# Patient Record
Sex: Male | Born: 1950 | Race: Black or African American | Hispanic: No | Marital: Single | Smoking: Former smoker
Health system: Southern US, Community
[De-identification: ages and names within clinical notes are randomized; demographics above are authoritative.]

## PROBLEM LIST (undated history)

## (undated) DIAGNOSIS — F419 Anxiety disorder, unspecified: Secondary | ICD-10-CM

## (undated) DIAGNOSIS — E785 Hyperlipidemia, unspecified: Secondary | ICD-10-CM

## (undated) DIAGNOSIS — F329 Major depressive disorder, single episode, unspecified: Secondary | ICD-10-CM

## (undated) DIAGNOSIS — E559 Vitamin D deficiency, unspecified: Secondary | ICD-10-CM

## (undated) DIAGNOSIS — F32A Depression, unspecified: Secondary | ICD-10-CM

## (undated) DIAGNOSIS — R9430 Abnormal result of cardiovascular function study, unspecified: Secondary | ICD-10-CM

## (undated) DIAGNOSIS — I2510 Atherosclerotic heart disease of native coronary artery without angina pectoris: Secondary | ICD-10-CM

## (undated) DIAGNOSIS — I499 Cardiac arrhythmia, unspecified: Secondary | ICD-10-CM

## (undated) DIAGNOSIS — R0602 Shortness of breath: Secondary | ICD-10-CM

## (undated) DIAGNOSIS — I10 Essential (primary) hypertension: Secondary | ICD-10-CM

## (undated) DIAGNOSIS — K219 Gastro-esophageal reflux disease without esophagitis: Secondary | ICD-10-CM

## (undated) DIAGNOSIS — C61 Malignant neoplasm of prostate: Secondary | ICD-10-CM

## (undated) HISTORY — DX: Abnormal result of cardiovascular function study, unspecified: R94.30

## (undated) HISTORY — DX: Atherosclerotic heart disease of native coronary artery without angina pectoris: I25.10

## (undated) HISTORY — DX: Hyperlipidemia, unspecified: E78.5

## (undated) HISTORY — DX: Anxiety disorder, unspecified: F41.9

## (undated) HISTORY — PX: CARDIAC CATHETERIZATION: SHX172

## (undated) HISTORY — PX: CORONARY ARTERY BYPASS GRAFT: SHX141

## (undated) HISTORY — DX: Depression, unspecified: F32.A

## (undated) HISTORY — DX: Shortness of breath: R06.02

## (undated) HISTORY — PX: INGUINAL HERNIA REPAIR: SUR1180

## (undated) HISTORY — PX: COLONOSCOPY WITH PROPOFOL: SHX5780

## (undated) HISTORY — DX: Major depressive disorder, single episode, unspecified: F32.9

## (undated) HISTORY — DX: Essential (primary) hypertension: I10

---

## 2000-05-10 ENCOUNTER — Emergency Department (HOSPITAL_COMMUNITY): Admission: EM | Admit: 2000-05-10 | Discharge: 2000-05-10 | Payer: Self-pay | Admitting: Emergency Medicine

## 2001-09-24 DIAGNOSIS — I2510 Atherosclerotic heart disease of native coronary artery without angina pectoris: Secondary | ICD-10-CM

## 2001-09-24 HISTORY — PX: CORONARY ARTERY BYPASS GRAFT: SHX141

## 2001-09-24 HISTORY — DX: Atherosclerotic heart disease of native coronary artery without angina pectoris: I25.10

## 2002-08-07 ENCOUNTER — Emergency Department (HOSPITAL_COMMUNITY): Admission: EM | Admit: 2002-08-07 | Discharge: 2002-08-07 | Payer: Self-pay | Admitting: Emergency Medicine

## 2002-08-11 ENCOUNTER — Emergency Department (HOSPITAL_COMMUNITY): Admission: EM | Admit: 2002-08-11 | Discharge: 2002-08-11 | Payer: Self-pay | Admitting: Emergency Medicine

## 2004-05-18 ENCOUNTER — Inpatient Hospital Stay (HOSPITAL_COMMUNITY): Admission: EM | Admit: 2004-05-18 | Discharge: 2004-05-22 | Payer: Self-pay | Admitting: Emergency Medicine

## 2004-08-03 ENCOUNTER — Ambulatory Visit: Payer: Self-pay | Admitting: Cardiology

## 2004-10-26 ENCOUNTER — Ambulatory Visit: Payer: Self-pay | Admitting: Professional

## 2004-11-08 ENCOUNTER — Ambulatory Visit: Payer: Self-pay | Admitting: Psychology

## 2004-12-05 ENCOUNTER — Ambulatory Visit: Payer: Self-pay | Admitting: Psychology

## 2004-12-15 ENCOUNTER — Ambulatory Visit: Payer: Self-pay | Admitting: Psychology

## 2004-12-25 ENCOUNTER — Ambulatory Visit: Payer: Self-pay | Admitting: Psychology

## 2005-03-13 ENCOUNTER — Ambulatory Visit: Payer: Self-pay | Admitting: Pulmonary Disease

## 2005-04-25 ENCOUNTER — Ambulatory Visit: Payer: Self-pay | Admitting: Pulmonary Disease

## 2005-06-21 ENCOUNTER — Ambulatory Visit: Payer: Self-pay | Admitting: Pulmonary Disease

## 2005-09-10 ENCOUNTER — Ambulatory Visit: Payer: Self-pay | Admitting: Gastroenterology

## 2006-06-04 ENCOUNTER — Ambulatory Visit: Payer: Self-pay | Admitting: Pulmonary Disease

## 2006-06-20 ENCOUNTER — Ambulatory Visit: Payer: Self-pay | Admitting: Cardiology

## 2006-07-02 ENCOUNTER — Ambulatory Visit: Payer: Self-pay

## 2006-09-06 ENCOUNTER — Ambulatory Visit: Payer: Self-pay | Admitting: Pulmonary Disease

## 2006-12-23 ENCOUNTER — Ambulatory Visit: Payer: Self-pay | Admitting: Internal Medicine

## 2008-03-08 DIAGNOSIS — D126 Benign neoplasm of colon, unspecified: Secondary | ICD-10-CM | POA: Insufficient documentation

## 2008-03-08 DIAGNOSIS — E7800 Pure hypercholesterolemia, unspecified: Secondary | ICD-10-CM

## 2008-03-09 ENCOUNTER — Ambulatory Visit: Payer: Self-pay | Admitting: Internal Medicine

## 2008-03-09 DIAGNOSIS — K4090 Unilateral inguinal hernia, without obstruction or gangrene, not specified as recurrent: Secondary | ICD-10-CM | POA: Insufficient documentation

## 2008-03-10 ENCOUNTER — Encounter: Payer: Self-pay | Admitting: Pulmonary Disease

## 2008-03-29 ENCOUNTER — Ambulatory Visit (HOSPITAL_COMMUNITY): Admission: RE | Admit: 2008-03-29 | Discharge: 2008-03-29 | Payer: Self-pay | Admitting: General Surgery

## 2011-02-06 NOTE — Op Note (Signed)
NAMEHAMMAD, [name] Allen NO.:  [id number]   MEDICAL RECORD NO.:  [id number]          PATIENT TYPE:  AMB   LOCATION:  DAY                          FACILITY:  [facility]   PHYSICIAN:  Adolph Pollack, M.D.DATE OF BIRTH:  [birth year]   DATE OF PROCEDURE:  03/29/2008  DATE OF DISCHARGE:                               OPERATIVE REPORT   PREOPERATIVE DIAGNOSIS:  Right inguinal hernia.   POSTOPERATIVE DIAGNOSIS:  Direct right inguinal hernia.   PROCEDURE:  Right inguinal repair with mesh.   SURGEON:  Adolph Pollack, M.D.   ANESTHESIA:  General by way of LMA plus Marcaine local.   INDICATIONS:  Mr. Molstad is a 60 year old male who has had a right  inguinal hernia for number of years, but it has become symptomatic and  getting larger; and now he presents for repair.  The procedure, risks,  and aftercare were discussed with him preoperatively.   TECHNIQUE:  He was seen in the holding area and the right groin marked  with my initials.  He was brought to the operating room, placed supine  on the operating table, and was given a general anesthetic by way of  LMA.  The hair on the right groin area was clipped, and the area  sterilely prepped and draped.  Marcaine solution was infiltrated  superficially and deep in the right groin.  The right groin incision was  made through skin, subcutaneous tissue, and Scarpa fascia until the  external oblique of the aponeurosis was exposed.  Local anesthetic was  then infiltrated deep to the external oblique aponeurosis.  An incision  was made in the external oblique aponeurosis through the external ring  medially, and up toward the anterior superior iliac spine laterally.  The underlying ilioinguinal nerve was identified and local anesthetic  infiltrated around it.   Using blunt dissection the internal oblique aponeurosis and muscle were  exposed superiorly and shelving edge of the inguinal ligament exposed  inferiorly.  I used blunt  dissection to isolate the spermatic cord, and  noted a direct hernia sac adherent to the spermatic cord.  Using blunt  dissection we were able to dissect the direct hernia sac free from the  spermatic cord and reduce it back through the direct defect.  No  indirect sac was noted.   Following this, the spermatic cord was retracted anteriorly and  superiorly.  A piece of the 3 x 6 inch polypropylene mesh was brought  into the field and anchored 1-2 cm medial to the pubic tubercle with 2-0  Prolene suture.  The inferior aspect of mesh was then anchored to the  shelving edge of the inguinal ligament with a running 2-0 Prolene suture  up to the level 1-2 cm lateral to the internal ring.  A slit was cut in  the mesh and 2 tails wrapped around the cord.  The superior aspect of  mesh was then anchored to the internal oblique aponeurosis with  interrupted 2-0 Vicryl sutures.  The 2 tails of mesh were crossed  creating a new internal ring that allowed the  tip of the hemostat to be  introduced.  The tails of mesh were then anchored to the shelving edge  of the inguinal ligament with a single 2-0 Prolene suture.  The lateral  aspect of mesh was then tucked deep to the external oblique aponeurosis.   I then inspected the wound, and hemostasis was adequate.  The external  oblique aponeurosis was closed over the mesh and cord with a running 3-0  Vicryl suture.  Scarpa fascia was closed with a running 2-0 Vicryl  suture.  The skin was closed with 4-0 Monocryl subcuticular stitch  followed by Steri-Strips, and sterile dressings.   He tolerated the procedure well without apparent complications.  Right  testicle was in its normal position in the scrotum.  He was taken to  recovery in satisfactory condition.  He will be given a preprinted with  discharge instructions and Tylox for pain, and follow up in the office  in 2-3 weeks.      Adolph Pollack, M.D.  Electronically Signed     TJR/MEDQ   D:  03/29/2008  T:  03/29/2008  Job:  [id number]   cc:   Lonzo Cloud. Kriste Basque, MD  [address]  [place]  [place] [address]

## 2011-02-09 NOTE — Assessment & Plan Note (Signed)
[facility]                            CARDIOLOGY OFFICE NOTE   NAME:[name] Allen, [name] Allen                         MRN:          [id number]  DATE:12/23/2006                            DOB:          1951/08/25    PRIMARY CARE PHYSICIAN:  Lonzo Cloud. Kriste Basque, M.D.   PRIMARY CARDIOLOGIST:  Luis Abed, MD, [facility]   HISTORY OF PRESENT ILLNESS:  Mr. Keim is a very pleasant 60 year old  male patient with a history of coronary artery disease status post CABG  in 2005 with previous EF of 45-50%.  He last saw Dr. Myrtis Ser in September  of 2007.  He had atypical chest pain at that time.  He was set up for a  stress nuclear study.  He exercised for six minutes.  There were no EKG  changes.  He had no sign of scar or ischemia, and his EF was 54%.  He  returns today for followup of chest pain.  He continues to have atypical  left-sided chest pain.  He describes it as a pinprick sensation.  He  notes that he gets this when he tries to lift something and carry it a  certain distance.  He actually tells me he has been noticing this since  his bypass surgery.  There has really been no change over the last  several months.  He denies any significant change in his shortness of  breath or his chest pain.  There is no radiation.  There is no  associated nausea or diaphoresis.  He denies any syncope.  He denies any  orthopnea or paroxysmal nocturnal dyspnea.   CURRENT MEDICATIONS:  He is currently taking nothing.  He says the  aspirin made him sick to his stomach.  He also notes that Zocor made him  sick to his stomach.   PHYSICAL EXAMINATION:  GENERAL:  He is well-developed, well-nourished,  in no acute distress.  VITAL SIGNS:  Blood pressure 127/77, pulse 61.  Weight 164 pounds.  HEENT:  Unremarkable.  NECK:  Without JVD.  CARDIAC:    S1 and S2.  Regular rate and rhythm without murmurs.  LUNGS:  Clear to auscultation bilaterally without wheezing, rhonchi, or  rales.  ABDOMEN:   Soft, nontender.  EXTREMITIES:  Without edema.  Calves are soft and nontender.  SKIN:  Warm and dry.  NEUROLOGIC:  He is alert and oriented x3.  Cranial nerves II-XII are  grossly intact.  Carotid arteries are 2+ bilaterally without bruits.   Electrocardiogram reveals sinus rhythm with a heart rate of 61.  No  acute changes.   IMPRESSION:  1. Atypical chest pain.  2. Coronary artery disease status post coronary artery bypass graft in      2005 with left internal mammary artery to the left anterior      descending.  3. Ejection fraction of about 50%.  4. Chronic sinus bradycardia.  5. Dyslipidemia.  6. Tobacco abuse.   PLAN:  The patient presents today with continued complaints of atypical  chest pain.  He has had these symptoms for quite some time  now.  He  notes that they actually date back to his bypass surgery.  He saw Dr.  Kriste Basque back in September of 2007 for an annual physical.  At that time,  he was still having these symptoms, and he had a normal chest x-ray at  that time.  He has also had a normal stress nuclear study since he has  seen Dr. Myrtis Ser.  He does describe symptoms of dyspepsia, about two to  three times per week.  He does have a lot of problems with tolerating  medications due to GI upset.  I think it is important that we treat for  the potential of gastroesophageal reflux disease contributing to his  symptoms.  Therefore, I have placed him on empiric proton pump inhibitor  therapy with Protonix 40 mg daily.  I have given him some samples as  well as a prescription.  I have asked him to try to start on coated baby  aspirin in a few days after starting his Protonix.  As long as he is  tolerating this well, in a couple of weeks he can start on medicine for  cholesterol.  His last LDL was 130.  He may need a substantial dose of a  statin to keep his LDL less than 70.  Dr. Kriste Basque had tried him on Zocor  80 mg, but he was unable to tolerate this.  I have given him  samples of  Crestor 10 mg a day.  I have also given him a prescription.  If he can  tolerate the Crestor with the addition of Protonix, then we will keep  him on that medication and recheck his lipids and LFTs some time in the  near future.  I will bring him back to follow up with Dr. Myrtis Ser in the  next four weeks.  If his symptoms should change between now and that  time, he should call us and we will see him back sooner.      Tereso Newcomer, PA-C  Electronically Signed      Noralyn Pick. Eden Emms, MD, [facility]  Electronically Signed   SW/MedQ  DD: 12/23/2006  DT: 12/23/2006  Job #: [id number]   cc:   Lonzo Cloud. Kriste Basque, MD

## 2011-02-09 NOTE — Consult Note (Signed)
NAMEHASKEL, [name] Allen NO.:  [id number]   MEDICAL RECORD NO.:  [id number]                   PATIENT TYPE:  INP   LOCATION:  1828                                 FACILITY:  [facility]   PHYSICIAN:  Salvatore Decent. Cornelius Moras, M.D.              DATE OF BIRTH:  [birth year]   DATE OF CONSULTATION:  05/18/2004  DATE OF DISCHARGE:                                   CONSULTATION   REQUESTING PHYSICIAN:  Vida Roller, M.D.   PRIMARY CARDIOLOGIST:  Willa Rough, M.D.   PRIMARY CARE PHYSICIAN:  Alroy Dust, M.D.   REASON FOR CONSULTATION:  Severe single-vessel coronary artery disease with  new onset unstable angina.   HISTORY OF PRESENT ILLNESS:  [name] Allen is a 60 year old African-American male  from [place] with no previous history of coronary artery disease.  He has  fairly unremarkable Past Medical History with few risk factors for coronary  artery disease although he does have long history of tobacco use.  His lipid  status is unknown.  The patient reports he was in his usual state of good  health until approximately 2 weeks ago when he first developed substernal  exertional chest discomfort.  He noticed this initially with strenuous  physical activity that he developed a dull aching or squeezing pain in his  chest.  It has always been resolved with rest.  The episodes of chest pain  had progressed somewhat over the last 2 weeks although he continues to deny  any prolonged episodes of chest pain or chest pain occurring at rest.  He  developed some chest pain earlier today again which was relieved with rest  but was severe enough that prompted him to present to Dr. Jodelle Green office.  A  12-lead electrocardiogram demonstrated ST segment elevation in the anterior  leads which was somewhat slight but nonetheless present.  The patient was  referred to the emergency room and upon arrival to the emergency room he was  noted to have resolution of both chest pain and ST segment  changes  previously noted on 12-lead electrocardiogram.  His cardiac enzymes were  negative on arrival.  He was promptly admitted to the hospital and brought  for urgent catheterization.  Cardiac catheterization is performed by Dr.  Dionicio Stall and findings are notable for critical ostial stenosis of the  left anterior descending coronary artery of greater than 95%.  This coronary  lesion is felt to be unfavorable for any attempt at percutaneous coronary  intervention due to the ostial nature of the lesion at the bifurcation of  the left main coronary artery.  There is no significant disease in the left  circumflex or right coronary artery territories.  Left ventricular function  is mildly reduced with mild intra-apical hypokinesis.  Ejection fraction is  estimated 45 to 50%.  Cardiac surgical consultation has been requested.   REVIEW OF SYSTEMS:  GENERAL:  The patient reports feeling well otherwise  with normal appetite.  He has not been gaining or losing weight.  CARDIAC:  Notable for new onset symptoms of angina over the last 2 weeks with  progression of symptoms and more prolonged episode of pain that occurred  earlier today.  The patient has remained free of chest pain since arrival in  the emergency room.  The patient denies any significant shortness of breath  either with exertion or at rest.  He denies history of PND, orthopnea, or  lower extremity edema.  He denies palpitations or syncope.  RESPIRATORY:  Negative.  The patient denies shortness of breath, productive cough,  hemoptysis, wheezing.  GASTROINTESTINAL:  Negative.  The patient reports no  difficulty swallowing.  He reports normal bowel function.  He denies history  of hematemesis, hematochezia, melena.  MUSCULOSKELETAL:  Negative.  The  patient denies problems with arthritis or arthralgias.  NEUROLOGICAL:  Negative.  The patient denies symptoms suggestive of previous TIA or stroke.  UROLOGIC:  Negative.  The patient  denies difficulty urinating.  INFECTIOUS:  Negative.  The patient denies recent fevers or chills.  ENDOCRINE:  Negative.  The patient denies symptoms suggestive of diabetes.  HEENT:  Negative.  PSYCHIATRIC:  Negative.   PAST MEDICAL HISTORY:  Notable only for history of tobacco use.   PAST SURGICAL HISTORY:  None.   FAMILY HISTORY:  Notable for the absence of premature coronary artery  disease.   MEDICATIONS PRIOR TO ADMISSION:  None.   DRUG ALLERGIES:  None known.   PHYSICAL EXAMINATION:  GENERAL:  The patient is a well-appearing, thin  African-American male who appears his stated age or perhaps even younger in  no acute distress.  VITAL SIGNS:  He is currently in sinus rhythm.  Blood pressure 132/70.  He  is afebrile.  HEENT:  Grossly unrevealing.  He does have poor dentition.  NECK:  Supple.  There is no palpable cervical or supraclavicular  lymphadenopathy.  There is no jugular venous distention.  No carotid bruits  are noted.  CHEST:  Auscultation of the chest includes clear and symmetrical breath  sounds bilaterally.  No wheezes and no rhonchi are noted.  CARDIOVASCULAR:  Includes regular rate and rhythm.  No murmurs, rubs, or  gallops are appreciated.  ABDOMEN:  Soft and nontender.  There are no palpable masses.  Bowel sounds  are present.  EXTREMITIES:  Warm and well perfused.  There is no lower extremity edema.  There is a compression pressure dressing in the right groin from  catheterization performed just previously.  Distal pulses are palpable on  both lower legs at the ankle.  There is no venous insufficiency.  RECTAL AND GENITOURINARY:  Both deferred.  NEUROLOGICAL:  Grossly nonfocal.   DIAGNOSTIC TESTS:  Cardiac catheterization performed by Dr. Dorethea Clan is  reviewed and results are as described previously.   IMPRESSION:  Critical ostial left anterior descending coronary artery  stenosis with new onset symptoms of angina.  I believe that [name] Allen would best be  treated by coronary artery bypass grafting and I favor proceeding  with surgery as soon as practical.   PLAN:  I have outlined issues at length with [name] Allen here in the cardiac  cath lab holding area.  Alternative treatment strategies have been  discussed.  He understands and accepts all associated risks of surgery  including but  not limited to risks of death, stroke, myocardial infarction, congestive  heart failure, respiratory failure, pneumonia, bleeding  requiring blood  transfusion, arrhythmia, infection, and recurrent coronary artery disease.  We tentatively plan to proceed with surgery tomorrow.  All questions have  been addressed.                                               Salvatore Decent. Cornelius Moras, M.D.    CHO/MEDQ  D:  05/18/2004  T:  05/19/2004  Job:  [id number]   cc:   Willa Rough, M.D.   Vida Roller, M.D.  Fax: [phone]   Lonzo Cloud. Kriste Basque, M.D. [facility]

## 2011-02-09 NOTE — Op Note (Signed)
NAMEBRADEN, CIMO NO.:  [id number]   MEDICAL RECORD NO.:  [id number]                   PATIENT TYPE:  INP   LOCATION:  2301                                 FACILITY:  [facility]   PHYSICIAN:  Salvatore Decent. Cornelius Moras, M.D.              DATE OF BIRTH:  Oct 22, 1950   DATE OF PROCEDURE:  05/19/2004  DATE OF DISCHARGE:                                 OPERATIVE REPORT   PREOPERATIVE DIAGNOSES:  Critical ostial stenosis of the left anterior  descending coronary artery with new onset unstable angina.   POSTOPERATIVE DIAGNOSES:  Critical ostial stenosis of the left anterior  descending coronary artery with new onset of unstable angina.   PROCEDURE:  Median sternotomy for off-pump coronary artery bypass grafting  x1 (left internal mammary artery to distal left anterior descending coronary  artery).   SURGEON:  Salvatore Decent. Cornelius Moras, M.D.   ASSISTANT:  Gwenith Daily. Tyrone Sage, M.D.   ANESTHESIA:  General.   INDICATIONS FOR PROCEDURE:  The patient is a 60 year old African-American  male who is followed by Dr. Lonzo Cloud. Kriste Basque, with no previous history of  coronary artery disease.  He presents with new onset symptoms of angina and  a prolonged episode of pain, prompting hospital admission on May 18, 2004.  The patient was taken for a cardiac catheterization by Dr. Vida Roller, and found to have critical 90%-95% ostial stenosis of the left  anterior descending coronary artery.  This lesion was felt not to be  favorable for a percutaneous coronary intervention.  No other significant  coronary artery disease was noted.  There was mild left ventricular  dysfunction.  A full consultation note has been dictated previously.   INFORMED OPERATIVE CONSENT:  The patient has been counseled at length  regarding the indications, risks, and potential benefits of a coronary  artery bypass grafting, and alternative treatment strategies have been  discussed.  He understands and accepts  all associated risks of surgery, and  desires to proceed as described.   DESCRIPTION OF PROCEDURE:  The patient is brought to the operating room on  the above-mentioned date and central monitoring is established by the  anesthesia service under the care and direction of Dr. Sheldon Silvan.  Specifically, a Swan-Ganz catheter is placed to the internal jugular  approach.  A radial arterial line is placed.  Intravenous antibiotics are  administered following the induction with general endotracheal anesthesia. A  Foley catheter is placed.  The patient's chest, abdomen, both groins, and  both lower extremities are prepared and draped in a sterile manner.  A median sternotomy incision is performed and the left internal mammary  artery is dissected from the chest wall and prepared for bypass grafting.  The left internal mammary artery is a good quality conduit.  The patient is  heparinized systemically.  The pericardium is opened.  The anterior surface  of the  heart is normal in appearance.  The aorta is normal in appearance.  The Guidant Acrobat stabilization device is utilized to facilitate off-pump  coronary artery bypass grafting.  The patient is placed in the Trendelenburg  position, and the table is rotated towards the surgeon's side, to facilitate  exposure.  The stabilization device is initially placed using the apical  suction cup to facilitate exposure, followed by the U-shaped stabilization  device surrounding the left anterior descending coronary artery.  This  vessel is grossly normal in appearance along the anterior surface.  Satisfactory stabilization is performed.  Elastic Vesi-loops are placed  proximally and distally to facilitate hemostasis.  A single vessel off-pump  coronary artery bypass grafting if now performed, using the left internal  mammary artery to the distal left anterior descending coronary artery.  At  the site of distal bypass, the left anterior descending coronary  artery is  1.8 mm in diameter and of good quality.  Exposure is felt to be excellent.  After the completion of the distal anastomosis, excellent flow is verified  through the left internal mammary artery graft using the sterile Doppler  flow probe.  Protamine was administered to reverse the anticoagulation.  The mediastinum and the left chest are irrigated with saline solution  containing vancomycin.  Meticulous surgical hemostasis is ascertained.  The  mediastinum and the left chest are drained with two chest tubes placed  through separate incisions inferiorly.  The median sternotomy is closed in a  routine fashion.  The soft tissues anterior to the sternum are closed in  multiple layers, and the skin is closed with a running subcuticular skin  closure.  The patient tolerated the procedure well, and is transported to the surgical  intensive care unit in stable condition.  There are no intraoperative  complications. All sponge, instrument and needle counts are verified correct  at the completion of the operation.  No blood products were administered.                                               Salvatore Decent. Cornelius Moras, M.D.    CHO/MEDQ  D:  05/19/2004  T:  05/21/2004  Job:  [id number]   cc:   Willa Rough, M.D.   Vida Roller, M.D.  Fax: [phone]   Lonzo Cloud. Kriste Basque, M.D. [facility]

## 2011-02-09 NOTE — Assessment & Plan Note (Signed)
[facility]                              CARDIOLOGY OFFICE NOTE   NAME:[name] Allen, [name] Allen                         MRN:          [id number]  DATE:06/20/2006                            DOB:          06-27-1951    Mr. Abrahamsen has known coronary disease. He was last seen in the cardiology  office in November of 2005. He is status post single vessel CABG with the  LIMA to his LAD due to a critical ostial LAD lesion in 2005. Historically  his ejection fraction has been in the 45-50% range. The patient is here  today and says that he does have chest discomfort with exercise. It is in  his mid chest. There is no radiation. There is nausea, vomiting or  diaphoresis. He is continuing about his activities.   PAST MEDICAL HISTORY:   ALLERGIES:  No known drug allergies.   MEDICATIONS:  Zocor. This was started recently. We do want him on one  aspirin daily also.   OTHER MEDICAL PROBLEMS:  See the list below.   REVIEW OF SYSTEMS:  The patient does not have any other complaints at this  time and his review of systems is negative.   PHYSICAL EXAMINATION:  VITAL SIGNS:  Blood pressure is 105/72 with a pulse  of 63.  GENERAL:  The patient is oriented to person, time and place. Affect is  normal. He is wearing his telephone headset as I examine him.  HEENT:  Reveals no xanthelasma. He has normal extraocular motion. There are  no carotid bruits. There is no jugular venous distention.  CARDIAC:  Reveals an S1 and S2. There are no clicks or significant murmurs.  ABDOMEN:  Soft. There are no masses or bruits.  EXTREMITIES:  There is no peripheral edema.   EKG had been done on June 04, 2006 through Dr. Jodelle Green office with no  acute change.   PROBLEM LIST:  1. Coronary disease status post single-vessel CABG in 2005.  2. History of ejection fraction 45-50%.  3. History of chronic bradycardia.  4. Dyslipidemia. He needs to be on a statin.  5. History of tobacco use  and he is again encouraged to stop.  6. Recurring chest pain. The patient needs an exercise Myoview scan for      further assessment and then I will see him back.            ______________________________  Luis Abed, MD, [facility]     JDK/MedQ  DD:  06/20/2006  DT:  06/22/2006  Job #:  [id number]

## 2011-02-09 NOTE — H&P (Signed)
[name], [name] Allen NO.:  [id number]   MEDICAL RECORD NO.:  [id number]                   PATIENT TYPE:  EMS   LOCATION:  MAJO                                 FACILITY:  [facility]   PHYSICIAN:  Willa Rough, M.D.                  DATE OF BIRTH:  27-Oct-1950   DATE OF ADMISSION:  05/18/2004  DATE OF DISCHARGE:                                HISTORY & PHYSICAL   HISTORY OF PRESENT ILLNESS:  [name] Allen is a 60 year old, male patient with no  known history of coronary artery disease who has had several days of  intermittent substernal chest pain with exertion.  He had pain today while  feeding his horses.  He went to see Dr. Kriste Basque who saw minimal increase in  elevation in the anterior leads which has now resolved in the emergency  room.  He is currently pain free.   PAST MEDICAL HISTORY:  No significant medical history.   MEDICATIONS:  None.   ALLERGIES:  No known drug allergies.   SOCIAL HISTORY:  He lives in [place] by himself.  He works with horses.  He is single with one daughter.  He denies any tobacco or illicit drug use.  He follows a regular diet.  He does lift weights, but has not in the past 3  weeks.  He drinks alcohol perhaps every other month.   FAMILY HISTORY:  His parents were both deceased at age 29.  His Dad died  secondary to prostate cancer.   REVIEW OF SYMPTOMS:  Negative, otherwise as above.   PHYSICAL EXAMINATION:  VITAL SIGNS:  Temperature 98.3, pulse 58,  respirations 18, blood pressure 140/80, saturations 100% on room air.  GENERAL:  He is in no acute distress.  HEENT:  Grossly normal.  NECK:  No bruits, JVD or thyromegaly.  CHEST:  Clear to auscultation bilaterally.  No wheezing or rhonchi.  HEART:  Regular rate and rhythm.  No S3 murmur or ectopy.  ABDOMEN:  Good bowel sounds, nontender, nondistended.  EXTREMITIES:  No lower extremity edema.  NEUROLOGIC:  Cranial nerves 2-12 grossly intact.   LABORATORY DATA AND X-RAY  FINDINGS:  Chest x-ray reveals mild cardiac  enlargement.  EKG reveals normal sinus rhythm with rate 49 with sinus  bradycardia with T wave inversions in the anterior leads.   Urine drug screen is negative.  Point of care markers are negative x1.  Hemoglobin 13.3, hematocrit 39.  BUN 7, creatinine 1.0, potassium 3.0.   ASSESSMENT/PLAN:  Chest pain.  Electrocardiogram changes have resolved in  the emergency room.  Will go ahead and place him on intravenous heparin,  intravenous nitroglycerin and aspirin.  He will be going to the  catheterization laboratory today.  The patient was seen and examined by Dr.  Myrtis Ser.      [name] Allen, P.A. [facility]  Willa Rough, M.D.    LB/MEDQ  D:  05/18/2004  T:  05/18/2004  Job:  [id number]   cc:   Lonzo Cloud. Kriste Basque, M.D. [facility]   Willa Rough, M.D.

## 2011-02-09 NOTE — Discharge Summary (Signed)
[name] Allen, [name] Allen NO.:  [id number]   MEDICAL RECORD NO.:  [id number]                   PATIENT TYPE:  INP   LOCATION:  2035                                 FACILITY:  [facility]   PHYSICIAN:  Salvatore Decent. Cornelius Moras, M.D.              DATE OF BIRTH:  [birth year]   DATE OF ADMISSION:  05/18/2004  DATE OF DISCHARGE:  05/21/2004                                 DISCHARGE SUMMARY   ADMISSION DIAGNOSIS:  Severe single vessel coronary artery disease with new  onset unstable angina.   PAST MEDICAL HISTORY:  1. History of tobacco abuse.  2. Critical ostial left anterior descending coronary artery stenosis status     post coronary artery bypass grafting x1.   ALLERGIES:  No known drug allergies.   BRIEF HISTORY:  The patient is a 60 year old African American male with no  previous history of coronary artery disease.  Approximately 2 weeks prior to  admission, the patient first developed substernal exertional chest  discomfort.  He noticed this initially with strenuous physical activity  followed by a dull aching or squeezing pain in his chest.  It always  resolved with rest.  However, the episodes of chest pain progressed over the  next two weeks.  The patient never experienced pain at rest.  On May 18, 2004, the patient developed chest pain which was again relieved by rest but  was severe enough to prompt him to visit Dr. Jodelle Green office.  A 12-lead EKG  demonstrated ST segment elevation in the anterior leads which as slight but  caused the patient to be referred to the emergency room at [facility].  Upon arrival in the emergency room, the chest pain had resolved  as well as the ST segment changes noted on the 12-lead.  The patient's  cardiac enzymes were negative upon arrival.  However, he was promptly  admitted and brought in for urgent cardiac catheterization.  Cath was  performed by Dr. Dorethea Clan and was notable for critical ostial stenosis of the  left anterior descending artery of greater than 95%.  The lesion was  unfavorable for an attempt at percutaneous intervention, and therefore, Dr.  Cornelius Moras of [facility] was consulted regarding surgical revascularization.  Dr. Cornelius Moras  evaluated the patient and felt that he would be best served by coronary  artery bypass grafting.   HOSPITAL COURSE:  The patient was admitted from the emergency department for  new onset angina as previously stated.  Cardiac catheterization was  performed by Dr. Dorethea Clan which revealed severe stenosis of the LAD and Dr.  Cornelius Moras was subsequently consulted regarding surgical revascularization.  It  was Dr. Orvan July opinion that the patient should undergo coronary artery  bypass grafting.  The patient was maintained on routine hospital care prior  to going to the OR on May 19, 2004.  The patient was taken to the OR for  an off pump coronary artery bypass grafting x1.  The left internal mammary  artery was grafted to the LAD.  The patient tolerated the procedure well and  was hemodynamically stable immediately postoperatively.  The patient was  transferred from the OR to the SICU in stable condition.  The patient was  extubated without complication.  Woke up from anesthesia neurologically  intact.   On postop day #1, the patient was afebrile.  Vital signs were stable with a  blood pressure of 94/52 and pulse of 66.  The patient was maintained in  normal sinus rhythm.  The patient's EKG revealed normal sinus rhythm with  nonspecific ST abnormality.  He was awake and alert without complaint.  The  patient's chest tube and basic lines were discontinued in routine fashion.  On postoperative day #2, the patient continues to remain in stable  condition.  He is afebrile and vital signs area stable.  He continues to  maintain a normal sinus rhythm.  The patient was awake and alert without  complaint.  Lungs were clear.  Cardiac exam revealed regular rate and rhythm  with a soft rub.   The patient is currently awaiting transfer from the SICU  to unit 2000.  The patient will be weaned off his O2 and continued on his  incentive spirometry.  The patient is making excellent progression  postoperatively.  As long as the patient continues to remain in stable  condition, he will be ready for discharge in the next 1-2 days.   LABORATORY DATA:  CBC and BMP on May 21, 2004:  White count 16.6,  hemoglobin 11, hematocrit 33, platelets 237,000.  Sodium 139, potassium 4.0,  BUN 9, creatinine 1.1, glucose 113.   CONDITION ON DISCHARGE:  Improved.   MEDICATIONS:  1. Aspirin 325 mg p.o. daily.  2. Colace 100 mg two tablets p.o. p.r.n. constipation.  3. Tylox 1-2 p.o. q.4-6h. p.r.n. pain.   DISCHARGE INSTRUCTIONS:  Activity:  No driving or lifting more than 10  pounds.  The patient is to continue taking breathing/walking exercises.  Diet:  Low-salt, low-fat.  Wound care:  The patient may shower daily and  clean the incisions with soap and water.  If the incisions become red,  swollen or draining, or the patient's fever is of 101, he is to contact [facility]  office at [phone].   FOLLOWUP:  The patient will need to follow up with Dr. Myrtis Ser two weeks after  discharge.  [facility] will establish this appointment for the  patient.  The patient will have a chest x-ray taken at the appointment with  Dr. Myrtis Ser which he will be responsible for bringing with him to the  appointment with Dr. Cornelius Moras.   The patient will have a follow up appointment established with Dr. Cornelius Moras  approximately three weeks after discharge.  [facility] office will contact the  patient with date and time of this appointment.      Pecola Leisure, PA                      Salvatore Decent. Cornelius Moras, M.D.    AY/MEDQ  D:  05/21/2004  T:  05/22/2004  Job:  [id number]   cc:   Willa Rough, M.D.   Salvatore Decent. Cornelius Moras, M.D.  [address]  [place]  [place] [address]

## 2011-02-09 NOTE — Cardiovascular Report (Signed)
[name], [name] Allen NO.:  [id number]   MEDICAL RECORD NO.:  [id number]                   PATIENT TYPE:  INP   LOCATION:  1828                                 FACILITY:  [facility]   PHYSICIAN:  Vida Roller, M.D.                DATE OF BIRTH:  [birth year]   DATE OF PROCEDURE:  05/18/2004  DATE OF DISCHARGE:                              CARDIAC CATHETERIZATION   HISTORY OF PRESENT ILLNESS:  [name] Allen Allen is a 60 year old man whose only  cardiac risk factor is cigarette smoking, who presented to Dr. Jodelle Green  office with chest pain and had a question of ST segment elevation on an EKG  done there.  He was transferred to the emergency department at [facility] where he was pain-free when he was evaluated, however, had  abnormal electrocardiogram with some evidence of ST segment depression with  T-wave inversion in the anterolateral leads and he was referred emergently  for heart catheterization.   DESCRIPTION OF PROCEDURE:  After obtaining informed consent the patient was  brought to the cardiac catheterization laboratory.  There, he was prepped  and draped in the usual sterile manner and local anesthetic was obtained  over the right groin using 1% lidocaine without epinephrine.  The right  femoral artery was cannulated using the modified Seldinger technique with a  6 French 10 cm sheath and left heart catheterization was performed using a 6  French Judkins left, a 6 French Judkins right #4, and a 6 French pigtail  catheter.  The pigtail catheter was used for left ventriculography in the 30  degree RAO view.  At the conclusion of the procedure, the catheters were  removed.  The patient was removed back to the cardiology holding area where  the femoral artery sheath was removed.  Hemostasis was obtained using direct  manual pressure.  At the conclusion of the hold, there was no evidence of  ecchymosis or hematoma formation and distal pulses  were intact.  Total  fluoroscopic time 2.7 minutes.  Total ionized contrast 100 mL of material.   RESULTS:  Aortic pressure 126/60, mean arterial pressure 92 mmHg.   Left ventricular pressure 135/15 with an end diastolic pressure of 24 mmHg.   Left coronary angiography:  The left main coronary artery is a moderate  caliber long artery which has luminal irregularities.   The left anterior descending coronary artery is a moderate caliber  transapical vessel which has a 95% stenosis in its ostial portion.  There  are two diagonal branches, both of which are moderate caliber and free of  disease.   The left circumflex coronary artery is a moderate caliber vessel which is  mildly tortuous.  There is a large branching first obtuse marginal.  The  remainder of the circumflex and the AV groove is small.   The right coronary artery is a moderate  caliber dominant vessel which has  luminal irregularities.   Left ventriculogram reveals mildly depressed LV function with an EF of 45 to  50% with evidence of hypokinesis in the anterior apical, apical and inferior  apical regions.  No mitral regurgitation is seen.   ASSESSMENT:  Single vessel coronary artery disease which is severe, mildly  depressed left ventricular function.  In reviewing the case with Dr. Geralynn Rile, his recommendation was for the patient to be referred for heart  surgery.  I reviewed the case with Dr. Tressie Stalker who agrees that heart surgery is  the best treatment for this patient and therefore, will take him to the  operating room tomorrow.  I will admit him overnight, treat him with  aspirin, heparin and a beta-blocker in hopes of controlling his ischemia.                                               Vida Roller, M.D.    JH/MEDQ  D:  05/18/2004  T:  05/19/2004  Job:  [id number]   cc:   Lonzo Cloud. Kriste Basque, M.D. [facility]

## 2011-06-21 LAB — COMPREHENSIVE METABOLIC PANEL
ALT: 14
AST: 19
Albumin: 3.5
Alkaline Phosphatase: 99
BUN: 6
CO2: 29
Chloride: 108
GFR calc Af Amer: >60
GFR calc non Af Amer: >60
Glucose, Bld: 96
Total Bilirubin: 0.7
Total Protein: 7

## 2011-06-21 LAB — DIFFERENTIAL
Basophils Absolute: 0.1
Monocytes Absolute: 0.7
Monocytes Relative: 7
Neutro Abs: 5.9
Neutrophils Relative %: 59

## 2011-06-21 LAB — CBC
HCT: 40.5
MCHC: 33.6
RBC: 4.55
WBC: 10

## 2011-09-25 HISTORY — PX: CARDIAC SURGERY: SHX584

## 2011-10-23 ENCOUNTER — Telehealth: Payer: Self-pay | Admitting: Pulmonary Disease

## 2011-10-23 NOTE — Telephone Encounter (Signed)
Chart reviewed Dr. Dellia Cloud was who we had referred him to in the past Spoke with pt and notified of this. He declined needing a new referral at this time and states just needed to know his name. Nothing further needed at this time.

## 2011-10-23 NOTE — Telephone Encounter (Signed)
Need paper chart Ordered and will await the chart

## 2012-02-29 ENCOUNTER — Ambulatory Visit (INDEPENDENT_AMBULATORY_CARE_PROVIDER_SITE_OTHER): Payer: Medicaid Other | Admitting: Family Medicine

## 2012-02-29 ENCOUNTER — Ambulatory Visit (HOSPITAL_COMMUNITY)
Admission: RE | Admit: 2012-02-29 | Discharge: 2012-02-29 | Disposition: A | Discharge status: Home / Self Care | Payer: Medicaid Other | Source: Ambulatory Visit | Attending: Family Medicine | Admitting: Family Medicine

## 2012-02-29 ENCOUNTER — Encounter: Payer: Self-pay | Admitting: Family Medicine

## 2012-02-29 VITALS — BP 122/78 | HR 84 | Temp 98.5°F | Ht 67.0 in | Wt 156.0 lb

## 2012-02-29 DIAGNOSIS — I2510 Atherosclerotic heart disease of native coronary artery without angina pectoris: Secondary | ICD-10-CM

## 2012-02-29 DIAGNOSIS — Z951 Presence of aortocoronary bypass graft: Secondary | ICD-10-CM

## 2012-02-29 DIAGNOSIS — M01X0 Direct infection of unspecified joint in infectious and parasitic diseases classified elsewhere: Secondary | ICD-10-CM | POA: Insufficient documentation

## 2012-02-29 DIAGNOSIS — Z9189 Other specified personal risk factors, not elsewhere classified: Secondary | ICD-10-CM

## 2012-02-29 DIAGNOSIS — Z23 Encounter for immunization: Secondary | ICD-10-CM

## 2012-02-29 LAB — TSH: TSH: 0.769 u[IU]/mL (ref 0.350–4.500)

## 2012-02-29 LAB — COMPREHENSIVE METABOLIC PANEL
BUN: 9 mg/dL (ref 6–23)
CO2: 25 mEq/L (ref 19–32)
Calcium: 9.3 mg/dL (ref 8.4–10.5)
Chloride: 107 mEq/L (ref 96–112)
Creat: 0.82 mg/dL (ref 0.50–1.35)
Glucose, Bld: 75 mg/dL (ref 70–99)
Sodium: 142 mEq/L (ref 135–145)
Total Bilirubin: 0.4 mg/dL (ref 0.3–1.2)

## 2012-02-29 LAB — LIPID PANEL
LDL Cholesterol: 111 mg/dL — ABNORMAL HIGH (ref 0–99)
Total CHOL/HDL Ratio: 5.6 Ratio
VLDL: 36 mg/dL (ref 0–40)

## 2012-02-29 LAB — POCT GLYCOSYLATED HEMOGLOBIN (HGB A1C): Hemoglobin A1C: 5.5

## 2012-02-29 LAB — CBC
Hemoglobin: 13.8 g/dL (ref 13.0–17.0)
RBC: 4.75 MIL/uL (ref 4.22–5.81)

## 2012-02-29 MED ORDER — METOPROLOL SUCCINATE ER 25 MG PO TB24: 12.5000 mg | ORAL_TABLET | Freq: Every day | ORAL | Status: DC

## 2012-02-29 MED ORDER — ATORVASTATIN CALCIUM 40 MG PO TABS: 40.0000 mg | ORAL_TABLET | Freq: Every day | ORAL | Status: DC

## 2012-02-29 MED ORDER — LISINOPRIL 5 MG PO TABS: 5.0000 mg | ORAL_TABLET | Freq: Every day | ORAL | Status: DC

## 2012-02-29 MED ORDER — NITROGLYCERIN 0.4 MG SL SUBL: 0.4000 mg | SUBLINGUAL_TABLET | SUBLINGUAL | Status: DC | PRN

## 2012-02-29 NOTE — Patient Instructions (Signed)
It was nice to meet you today, [name] Fearing. I will refer you to a Cardiologist.  You may need to do a stress test in their office. Start taking the following medications for heart disease: Lipitor, Lisinopril, Metoprolol, Aspirin 325 DAILY. Stop any activity that causes chest pain and try taking one Nitroglycerin tablet under your tongue as needed for chest pain. If this does not relieve chest pain, go to the Emergency Room. I will send you a copy of all your recent lab work in about one week. We will call you with time and date of Cardiology appointment. IF YOU DEVELOP CHEST PAIN ASSOCIATED WITH SHORTNESS OF BREATH, EXCESSIVE SWEATING, NAUSEA OR VOMITING, PLEASE GO TO THE EMERGENCY ROOM.  Coronary Artery Disease  Coronary artery disease (CAD) happens when the arteries of the heart become narrow and hardened. Buildup of plaque in the walls of the vessels blocks blood flow. This can cause minor chest pain (angina) if the blockage is partial. A heart attack or MI (myocardial infarction) happens when the artery is completely blocked. MIs can lead to shock, heart failure, and sudden death. CAD is the leading cause of death in men and women.  CAUSES  The risk for getting CAD can be inherited. There are other risk factors that can be helped. These factors include:  Cigarette smoking.   Diabetes.   Cholesterol.   High blood pressure.  Being overweight and not exercising regularly also increase your risk for having CAD.  SYMPTOMS  Chest pain.   Shortness of breath.   Weakness.   Nausea.   Fainting.  DIAGNOSIS  The diagnosis may require:  An EKG.   Stress test.   Chest X-ray.   Cardiac scan.   Echocardiogram.   A coronary angiogram.  TREATMENT  Treatment includes immediate measures for symptom relief. Medicines for chest pain, cholesterol reduction, blood pressure control, and aspirin to prevent clotting may be needed. Coronary angioplasty (using a balloon to open a blockage in a  coronary artery) is helpful in managing MIs. It is also useful for some patients with angina. Losing weight, controlling the blood sugar, and not smoking are also important to long-term success. The optimal diet should emphasize fruits and vegetables. Sodas, deep-fried foods, and sweets should be avoided.  SEEK IMMEDIATE MEDICAL CARE IF:  You develop severe chest pain or pain that does not improve with rest and medicine.   You develop pain that radiates into the arms, neck, jaw, or back.   You develop fainting, shortness of breath, severe palpitations, or vomiting.   You start sweating a lot.  Document Released: 10/18/2004 Document Revised: 08/30/2011 Document Reviewed: 09/08/2008 [facility] Patient Information 2012 [place], [place].

## 2012-03-01 ENCOUNTER — Encounter: Payer: Self-pay | Admitting: Family Medicine

## 2012-03-01 DIAGNOSIS — Z951 Presence of aortocoronary bypass graft: Secondary | ICD-10-CM | POA: Insufficient documentation

## 2012-03-01 NOTE — Progress Notes (Signed)
  Subjective:    Patient ID: [name] Allen, male    DOB: 1951/03/24, 61 y.o.   MRN: [id number]  HPI  Patient presents to clinic to establish care.  Was last seen by previous PCP one year ago and Cardiologist in 2007 (he thinks he was seen by [employer]).    Patient has a history or CAD status post CABG in 2003.  He was doing well for several years, then developed intermittent chest pain about 3-4 years ago.  Pain is worse with exertion, better with rest.  He cannot walk up a flight of stairs or walk 2 blocks without getting SOB or developing CP.  He is not taking any medications at this time.  CP is not aggravated by eating food.  It is not associated with nausea or vomiting, diaphoresis, or radiation.  Former smoker - quit about 10 years ago.   Review of Systems  Per HPI    Objective:   Physical Exam  Constitutional: No distress.  Neck: Normal range of motion. Neck supple. No JVD present.  Cardiovascular: Normal rate, regular rhythm and normal heart sounds.   Pulmonary/Chest: Effort normal and breath sounds normal. No respiratory distress. He has no wheezes. He has no rales.  Abdominal: Soft. Bowel sounds are normal. He exhibits no distension. There is no tenderness.  Musculoskeletal: He exhibits no edema.          Assessment & Plan:

## 2012-03-01 NOTE — Assessment & Plan Note (Signed)
Chronic, ongoing chest pain that has been getting worse in the last year. EKG NSR, no ST abnormalities. Risk stratification: TSH, Lipid panel, A1c, and also check CBC, CMET. Start low dose BB, ACEi, statin, and ASA. Referral to Cardiology for further management/recommendations.

## 2012-03-01 NOTE — Assessment & Plan Note (Signed)
Today, CP symptoms consistent with stable angina. Refer to CAD for plan/recommendations. Also added Nitroglycerin SL PRN for chest pain. Red flags reviewed.

## 2012-03-06 ENCOUNTER — Encounter: Payer: Self-pay | Admitting: Family Medicine

## 2012-03-24 ENCOUNTER — Encounter: Payer: Self-pay | Admitting: *Deleted

## 2012-03-24 ENCOUNTER — Other Ambulatory Visit: Payer: Self-pay | Admitting: Cardiovascular Disease

## 2012-03-24 ENCOUNTER — Ambulatory Visit (INDEPENDENT_AMBULATORY_CARE_PROVIDER_SITE_OTHER): Payer: Medicaid Other | Admitting: Cardiovascular Disease

## 2012-03-24 ENCOUNTER — Encounter: Payer: Self-pay | Admitting: Cardiovascular Disease

## 2012-03-24 ENCOUNTER — Encounter (HOSPITAL_COMMUNITY): Payer: Self-pay | Admitting: Pharmacy Technician

## 2012-03-24 VITALS — BP 126/80 | HR 64 | Ht 67.0 in | Wt 154.0 lb

## 2012-03-24 DIAGNOSIS — I10 Essential (primary) hypertension: Secondary | ICD-10-CM

## 2012-03-24 DIAGNOSIS — Z951 Presence of aortocoronary bypass graft: Secondary | ICD-10-CM

## 2012-03-24 DIAGNOSIS — I2510 Atherosclerotic heart disease of native coronary artery without angina pectoris: Secondary | ICD-10-CM

## 2012-03-24 DIAGNOSIS — E7800 Pure hypercholesterolemia, unspecified: Secondary | ICD-10-CM

## 2012-03-24 DIAGNOSIS — R079 Chest pain, unspecified: Secondary | ICD-10-CM

## 2012-03-24 LAB — BASIC METABOLIC PANEL WITH GFR
BUN: 7 mg/dL (ref 6–23)
CO2: 26 meq/L (ref 19–32)
Calcium: 9 mg/dL (ref 8.4–10.5)
Chloride: 106 meq/L (ref 96–112)
Creatinine, Ser: 0.9 mg/dL (ref 0.4–1.5)
GFR: 117.33 mL/min
Glucose, Bld: 91 mg/dL (ref 70–99)
Potassium: 3.7 meq/L (ref 3.5–5.1)
Sodium: 139 meq/L (ref 135–145)

## 2012-03-24 LAB — CBC WITH DIFFERENTIAL/PLATELET
Basophils Absolute: 0.1 K/uL (ref 0.0–0.1)
Basophils Relative: 0.7 % (ref 0.0–3.0)
Eosinophils Absolute: 0.5 K/uL (ref 0.0–0.7)
Eosinophils Relative: 5.2 % — ABNORMAL HIGH (ref 0.0–5.0)
HCT: 37.7 % — ABNORMAL LOW (ref 39.0–52.0)
Hemoglobin: 12.3 g/dL — ABNORMAL LOW (ref 13.0–17.0)
Lymphocytes Relative: 31.7 % (ref 12.0–46.0)
Lymphs Abs: 3.2 K/uL (ref 0.7–4.0)
MCHC: 32.7 g/dL (ref 30.0–36.0)
MCV: 90.8 fl (ref 78.0–100.0)
Monocytes Absolute: 0.7 K/uL (ref 0.1–1.0)
Monocytes Relative: 7 % (ref 3.0–12.0)
Neutro Abs: 5.6 K/uL (ref 1.4–7.7)
Neutrophils Relative %: 55.4 % (ref 43.0–77.0)
Platelets: 406 K/uL — ABNORMAL HIGH (ref 150.0–400.0)
RBC: 4.15 Mil/uL — ABNORMAL LOW (ref 4.22–5.81)
RDW: 15.2 % — ABNORMAL HIGH (ref 11.5–14.6)
WBC: 10.1 K/uL (ref 4.5–10.5)

## 2012-03-24 LAB — PROTIME-INR
INR: 1 ratio (ref 0.8–1.0)
Prothrombin Time: 11 s (ref 10.2–12.4)

## 2012-03-24 NOTE — Assessment & Plan Note (Signed)
Well controlled.  Continue current medications and low sodium Dash type diet.    

## 2012-03-24 NOTE — Assessment & Plan Note (Signed)
Recurrent angina responsive to nitro.  Only lima to LAD in 2005  Cath scheduled for this week.  Risks discussed and willing to proceed Will get lab work today.  Continue ASA and beta blocker

## 2012-03-24 NOTE — Progress Notes (Signed)
Patient ID: [name] Allen, male   DOB: 01/06/1951, 61 y.o.   MRN: [id number] 61 yo patient of Dr Katz.  Last seen in 2008.  Had single vessel lima to LAD in 2005 for new onset angina and ostial LAD lesion.  Lost to F/U over insurance issues But has medicaid now.  SSCP for months with exertion.  Some relief with nitro.  Pain like previous angina.  No prolonged episodes or rest pain.  Compliant with meds No dyspnea, palpitations or syncope.  CRF;s include HTN and elevated lipids on Rx  ROS: Denies fever, malais, weight loss, blurry vision, decreased visual acuity, cough, sputum, SOB, hemoptysis, pleuritic pain, palpitaitons, heartburn, abdominal pain, melena, lower extremity edema, claudication, or rash.  All other systems reviewed and negative   General: Affect appropriate Healthy:  appears stated age HEENT: normal Neck supple with no adenopathy JVP normal no bruits no thyromegaly Lungs clear with no wheezing and good diaphragmatic motion Heart:  S1/S2 no murmur,rub, gallop or click PMI normal Abdomen: benighn, BS positve, no tenderness, no AAA no bruit.  No HSM or HJR Distal pulses intact with no bruits No edema Neuro non-focal Skin warm and dry No muscular weakness  Medications Current Outpatient Prescriptions  Medication Sig Dispense Refill  . aspirin 81 MG tablet Take 81 mg by mouth daily.      . atorvastatin (LIPITOR) 40 MG tablet Take 1 tablet (40 mg total) by mouth daily.  30 tablet  3  . lisinopril (PRINIVIL,ZESTRIL) 5 MG tablet Take 1 tablet (5 mg total) by mouth daily.  90 tablet  3  . metoprolol succinate (TOPROL-XL) 25 MG 24 hr tablet Take 0.5 tablets (12.5 mg total) by mouth daily.  90 tablet  3  . Multiple Vitamin (MULTIVITAMIN) capsule Take 1 capsule by mouth daily.      . nitroGLYCERIN (NITROSTAT) 0.4 MG SL tablet Place 1 tablet (0.4 mg total) under the tongue every 5 (five) minutes as needed for chest pain.  30 tablet  0    Allergies Review of patient's allergies  indicates no known allergies.  Family History: Family History  Problem Relation Age of Onset  . Heart disease Mother   . Depression Mother   . Diabetes Mother   . Early death Mother   . Hypertension Mother   . Hyperlipidemia Mother   . Kidney disease Father   . Early death Father   . Cancer Father   . Hypertension Sister     Social History: History   Social History  . Marital Status: Legally Separated    Spouse Name: N/A    Number of Children: N/A  . Years of Education: N/A   Occupational History  . Not on file.   Social History Main Topics  . Smoking status: Former Smoker -- 10 years    Types: Cigarettes    Quit date: 06/30/2001  . Smokeless tobacco: Not on file  . Alcohol Use: No  . Drug Use: No  . Sexually Active: Not on file   Other Topics Concern  . Not on file   Social History Narrative  . No narrative on file    Electrocardiogram:  02/29/12  NSR rate 74 normal ECG  Assessment and Plan   

## 2012-03-24 NOTE — Assessment & Plan Note (Signed)
Recurrent chest pain responsive to nitro  RCA/Circ not bypassed in 2005  Cath to be scheduled Risks discussed Willing to proceed.  Labs today and precath orders done

## 2012-03-24 NOTE — Assessment & Plan Note (Signed)
Cholesterol is at goal.  Continue current dose of statin and diet Rx.  No myalgias or side effects.  F/U  LFT's in 6 months. Lab Results  Component Value Date   LDLCALC 111* 02/29/2012

## 2012-03-24 NOTE — Patient Instructions (Addendum)
.  Your physician recommends that you schedule a follow-up appointment in: AFTER CATH WITH DR Myrtis Ser Your physician recommends that you continue on your current medications as directed. Please refer to the Current Medication list given to you today. Your physician recommends that you return for lab work in: TODAY BMET  CBC INR  DX V72.81 Your physician has requested that you have a cardiac catheterization. Cardiac catheterization is used to diagnose and/or treat various heart conditions. Doctors may recommend this procedure for a number of different reasons. The most common reason is to evaluate chest pain. Chest pain can be a symptom of coronary artery disease (CAD), and cardiac catheterization can show whether plaque is narrowing or blocking your heart's arteries. This procedure is also used to evaluate the valves, as well as measure the blood flow and oxygen levels in different parts of your heart. For further information please visit [email or web address] Please follow instruction sheet, as given.

## 2012-03-26 ENCOUNTER — Encounter (HOSPITAL_COMMUNITY)
Admission: RE | Disposition: A | Discharge status: Home / Self Care | Payer: Self-pay | Source: Ambulatory Visit | Attending: Cardiology

## 2012-03-26 ENCOUNTER — Ambulatory Visit (HOSPITAL_COMMUNITY)
Admission: RE | Admit: 2012-03-26 | Discharge: 2012-03-27 | Disposition: A | Discharge status: Home / Self Care | Payer: Medicaid Other | Source: Ambulatory Visit | Attending: Cardiology | Admitting: Cardiology

## 2012-03-26 ENCOUNTER — Encounter (HOSPITAL_COMMUNITY): Payer: Self-pay | Admitting: General Practice

## 2012-03-26 DIAGNOSIS — I209 Angina pectoris, unspecified: Secondary | ICD-10-CM | POA: Insufficient documentation

## 2012-03-26 DIAGNOSIS — I2510 Atherosclerotic heart disease of native coronary artery without angina pectoris: Secondary | ICD-10-CM

## 2012-03-26 DIAGNOSIS — Z951 Presence of aortocoronary bypass graft: Secondary | ICD-10-CM

## 2012-03-26 DIAGNOSIS — F411 Generalized anxiety disorder: Secondary | ICD-10-CM | POA: Insufficient documentation

## 2012-03-26 DIAGNOSIS — Z23 Encounter for immunization: Secondary | ICD-10-CM

## 2012-03-26 DIAGNOSIS — F329 Major depressive disorder, single episode, unspecified: Secondary | ICD-10-CM | POA: Insufficient documentation

## 2012-03-26 DIAGNOSIS — R0989 Other specified symptoms and signs involving the circulatory and respiratory systems: Secondary | ICD-10-CM | POA: Insufficient documentation

## 2012-03-26 DIAGNOSIS — Z9189 Other specified personal risk factors, not elsewhere classified: Secondary | ICD-10-CM

## 2012-03-26 DIAGNOSIS — E7800 Pure hypercholesterolemia, unspecified: Secondary | ICD-10-CM

## 2012-03-26 DIAGNOSIS — I10 Essential (primary) hypertension: Secondary | ICD-10-CM | POA: Insufficient documentation

## 2012-03-26 DIAGNOSIS — R0609 Other forms of dyspnea: Secondary | ICD-10-CM | POA: Insufficient documentation

## 2012-03-26 DIAGNOSIS — F3289 Other specified depressive episodes: Secondary | ICD-10-CM | POA: Insufficient documentation

## 2012-03-26 HISTORY — PX: PERCUTANEOUS CORONARY STENT INTERVENTION (PCI-S): SHX5485

## 2012-03-26 HISTORY — PX: LEFT HEART CATHETERIZATION WITH CORONARY/GRAFT ANGIOGRAM: SHX5450

## 2012-03-26 HISTORY — PX: CORONARY ANGIOPLASTY WITH STENT PLACEMENT: SHX49

## 2012-03-26 HISTORY — DX: Cardiac arrhythmia, unspecified: I49.9

## 2012-03-26 LAB — CK TOTAL AND CKMB (NOT AT ARMC)
CK, MB: 2.1 ng/mL (ref 0.3–4.0)
CK, MB: 2.2 ng/mL (ref 0.3–4.0)
Relative Index: 2.1 (ref 0.0–2.5)
Relative Index: 1.9 (ref 0.0–2.5)
Total CK: 116 U/L (ref 7–232)

## 2012-03-26 LAB — POCT ACTIVATED CLOTTING TIME: Activated Clotting Time: 484 seconds

## 2012-03-26 SURGERY — LEFT HEART CATHETERIZATION WITH CORONARY/GRAFT ANGIOGRAM

## 2012-03-26 MED ORDER — HYDROCODONE-ACETAMINOPHEN 5-325 MG PO TABS
1.0000 | ORAL_TABLET | Freq: Four times a day (QID) | ORAL | Status: DC | PRN
  Administered 2012-03-26 – 2012-03-27 (×2): 1 via ORAL
  Filled 2012-03-26 (×2): qty 1

## 2012-03-26 MED ORDER — ONDANSETRON HCL 4 MG/2ML IJ SOLN: 4.0000 mg | Freq: Four times a day (QID) | INTRAMUSCULAR | Status: DC | PRN

## 2012-03-26 MED ORDER — MULTIVITAMINS PO CAPS: 1.0000 | ORAL_CAPSULE | Freq: Every day | ORAL | Status: DC

## 2012-03-26 MED ORDER — FENTANYL CITRATE 0.05 MG/ML IJ SOLN
INTRAMUSCULAR | Status: AC
  Filled 2012-03-26: qty 2

## 2012-03-26 MED ORDER — ASPIRIN 81 MG PO CHEW
324.0000 mg | CHEWABLE_TABLET | ORAL | Status: AC
  Administered 2012-03-26: 324 mg via ORAL

## 2012-03-26 MED ORDER — MIDAZOLAM HCL 2 MG/2ML IJ SOLN
INTRAMUSCULAR | Status: AC
  Filled 2012-03-26: qty 2

## 2012-03-26 MED ORDER — METOPROLOL SUCCINATE 12.5 MG HALF TABLET
12.5000 mg | ORAL_TABLET | Freq: Every day | ORAL | Status: DC
  Administered 2012-03-26: 12.5 mg via ORAL
  Filled 2012-03-26 (×2): qty 1

## 2012-03-26 MED ORDER — SODIUM CHLORIDE 0.9 % IV SOLN: 1.0000 mL/kg/h | INTRAVENOUS | Status: AC

## 2012-03-26 MED ORDER — ASPIRIN 81 MG PO CHEW
CHEWABLE_TABLET | ORAL | Status: AC
  Filled 2012-03-26: qty 4

## 2012-03-26 MED ORDER — SODIUM CHLORIDE 0.9 % IV SOLN
250.0000 mL | INTRAVENOUS | Status: DC | PRN
  Administered 2012-03-26: 1000 mL via INTRAVENOUS

## 2012-03-26 MED ORDER — LIDOCAINE HCL (PF) 1 % IJ SOLN
INTRAMUSCULAR | Status: AC
  Filled 2012-03-26: qty 30

## 2012-03-26 MED ORDER — SODIUM CHLORIDE 0.9 % IJ SOLN: 3.0000 mL | Freq: Two times a day (BID) | INTRAMUSCULAR | Status: DC

## 2012-03-26 MED ORDER — SODIUM CHLORIDE 0.9 % IJ SOLN: 3.0000 mL | INTRAMUSCULAR | Status: DC | PRN

## 2012-03-26 MED ORDER — LISINOPRIL 5 MG PO TABS
5.0000 mg | ORAL_TABLET | Freq: Every day | ORAL | Status: DC
  Administered 2012-03-26: 5 mg via ORAL
  Filled 2012-03-26 (×2): qty 1

## 2012-03-26 MED ORDER — ASPIRIN 81 MG PO TABS: 81.0000 mg | ORAL_TABLET | Freq: Every day | ORAL | Status: DC

## 2012-03-26 MED ORDER — TICAGRELOR 90 MG PO TABS
ORAL_TABLET | ORAL | Status: AC
  Filled 2012-03-26: qty 2

## 2012-03-26 MED ORDER — ATORVASTATIN CALCIUM 40 MG PO TABS
40.0000 mg | ORAL_TABLET | Freq: Every day | ORAL | Status: DC
  Administered 2012-03-26: 40 mg via ORAL
  Filled 2012-03-26 (×2): qty 1

## 2012-03-26 MED ORDER — BIVALIRUDIN 250 MG IV SOLR
INTRAVENOUS | Status: AC
  Filled 2012-03-26: qty 250

## 2012-03-26 MED ORDER — NITROGLYCERIN 0.2 MG/ML ON CALL CATH LAB
INTRAVENOUS | Status: AC
  Filled 2012-03-26: qty 1

## 2012-03-26 MED ORDER — ASPIRIN 81 MG PO CHEW: 81.0000 mg | CHEWABLE_TABLET | Freq: Every day | ORAL | Status: DC

## 2012-03-26 MED ORDER — ADULT MULTIVITAMIN W/MINERALS CH
1.0000 | ORAL_TABLET | Freq: Every day | ORAL | Status: DC
  Administered 2012-03-26: 1 via ORAL
  Filled 2012-03-26 (×2): qty 1

## 2012-03-26 MED ORDER — ACETAMINOPHEN 325 MG PO TABS: 650.0000 mg | ORAL_TABLET | ORAL | Status: DC | PRN

## 2012-03-26 MED ORDER — NITROGLYCERIN 0.4 MG SL SUBL: 0.4000 mg | SUBLINGUAL_TABLET | SUBLINGUAL | Status: DC | PRN

## 2012-03-26 MED ORDER — TICAGRELOR 90 MG PO TABS
90.0000 mg | ORAL_TABLET | Freq: Two times a day (BID) | ORAL | Status: DC
  Administered 2012-03-26 – 2012-03-27 (×2): 90 mg via ORAL
  Filled 2012-03-26 (×3): qty 1

## 2012-03-26 MED ORDER — HEPARIN (PORCINE) IN NACL 2-0.9 UNIT/ML-% IJ SOLN
INTRAMUSCULAR | Status: AC
  Filled 2012-03-26: qty 2000

## 2012-03-26 NOTE — CV Procedure (Signed)
  Cardiac Catheterization Procedure Note  Name: [name] Allen MRN: [id number] DOB: 1951-06-08  Procedure: Left Heart Cath, Selective Coronary Angiography, LV angiography,  PTCA/Stent of the RCA  Indication: 61 year old black male with history of coronary disease status post single-vessel coronary bypass surgery in 2008 for an ostial LAD lesion. He presents with recurrent angina similar to pain he had prior to his bypass surgery.   Diagnostic Procedure Details: The right groin was prepped, draped, and anesthetized with 1% lidocaine. Using the modified Seldinger technique, a 5 French sheath was introduced into the right femoral artery. Standard Judkins catheters were used for selective coronary angiography and left ventriculography. Catheter exchanges were performed over a wire.  The diagnostic procedure was well-tolerated without immediate complications.  PROCEDURAL FINDINGS Hemodynamics: AO 139/73 with a mean of 98 mmHg LV 140/17 mmHg  Coronary angiography: Coronary dominance: right  Left mainstem: Normal.  Left anterior descending (LAD): There is mild ostial LAD disease up to 30-40%. There are mild irregularities in the mid vessel up to 20%.  Left circumflex (LCx): The left circumflex gives rise to a single marginal branch and then terminates in the AV groove. Has minor irregularities less than 10%.  Right coronary artery (RCA): The right coronary is a dominant vessel. In the mid vessel there is a focal 99% stenosis. This did not change with administration of intracoronary nitroglycerin.  The LIMA graft to the LAD is patent but there is significant competitive flow from the native LAD and there is very little antegrade flow in the IMA graft.  Left ventriculography: Left ventricular systolic function is normal, LVEF is estimated at 55-65%, there is no significant mitral regurgitation   PCI Procedure Note:  Following the diagnostic procedure, the decision was made to proceed with PCI. The  sheath was upsized to a 6 [place]. Weight-based bivalirudin was given for anticoagulation. Brilinta 180 mg po was given. Once a therapeutic ACT was achieved, a 6 [place] FR4 guide catheter with sideholes was inserted.  A pro-water coronary guidewire was used to cross the lesion.  The lesion was predilated with a 2.0 mm balloon.  The lesion was then stented with a 2.5 x 12 mm Evolve 2 study stent.  The stent was postdilated with a 2.75 mm noncompliant balloon.  Following PCI, there was 0% residual stenosis and TIMI-3 flow. Final angiography confirmed an excellent result. Femoral hemostasis was achieved with an Angio-Seal device.  The patient tolerated the PCI procedure well. There were no immediate procedural complications.  The patient was transferred to the post catheterization recovery area for further monitoring.  PCI Data: Vessel - RCA/Segment - mid Percent Stenosis (pre)  99% TIMI-flow 3 Stent 2.5 x 12 mm Evolve 2 study stent Percent Stenosis (post) 0% TIMI-flow (post) 3  Final Conclusions:   1. Single vessel obstructive coronary disease involving the mid RCA. The ostial LAD appears nonobstructive. 2. Patent LIMA graft to the LAD but with predominant flow down the native vessel. 3. Normal LV function. 4. Successful stenting of the mid RCA with a DES.  Recommendations: Continue dual antiplatelet therapy for one year.  [name] Allen [facility] 03/26/2012, 2:23 PM

## 2012-03-26 NOTE — Interval H&P Note (Signed)
History and Physical Interval Note:  03/26/2012 1:20 PM  [name] Allen  has presented today for surgery, with the diagnosis of Chest pain  The various methods of treatment have been discussed with the patient and family. After consideration of risks, benefits and other options for treatment, the patient has consented to  Procedure(s) (LRB): LEFT HEART CATHETERIZATION WITH CORONARY/GRAFT ANGIOGRAM () as a surgical intervention .  The patient's history has been reviewed, patient examined, no change in status, stable for surgery.  I have reviewed the patients' chart and labs.  Questions were answered to the patient's satisfaction.     Theron Arista [facility] 03/26/2012 1:21 PM

## 2012-03-26 NOTE — H&P (View-Only) (Signed)
Patient ID: [name] Allen, male   DOB: [birth year], 61 y.o.   MRN: [id number] 61 yo patient of Dr Myrtis Ser.  Last seen in 2008.  Had single vessel lima to LAD in 2005 for new onset angina and ostial LAD lesion.  Lost to F/U over insurance issues But has medicaid now.  SSCP for months with exertion.  Some relief with nitro.  Pain like previous angina.  No prolonged episodes or rest pain.  Compliant with meds No dyspnea, palpitations or syncope.  CRF;s include HTN and elevated lipids on Rx  ROS: Denies fever, malais, weight loss, blurry vision, decreased visual acuity, cough, sputum, SOB, hemoptysis, pleuritic pain, palpitaitons, heartburn, abdominal pain, melena, lower extremity edema, claudication, or rash.  All other systems reviewed and negative   General: Affect appropriate Healthy:  appears stated age HEENT: normal Neck supple with no adenopathy JVP normal no bruits no thyromegaly Lungs clear with no wheezing and good diaphragmatic motion Heart:  S1/S2 no murmur,rub, gallop or click PMI normal Abdomen: benighn, BS positve, no tenderness, no AAA no bruit.  No HSM or HJR Distal pulses intact with no bruits No edema Neuro non-focal Skin warm and dry No muscular weakness  Medications Current Outpatient Prescriptions  Medication Sig Dispense Refill  . aspirin 81 MG tablet Take 81 mg by mouth daily.      [name] atorvastatin (LIPITOR) 40 MG tablet Take 1 tablet (40 mg total) by mouth daily.  30 tablet  3  . lisinopril (PRINIVIL,ZESTRIL) 5 MG tablet Take 1 tablet (5 mg total) by mouth daily.  90 tablet  3  . metoprolol succinate (TOPROL-XL) 25 MG 24 hr tablet Take 0.5 tablets (12.5 mg total) by mouth daily.  90 tablet  3  . Multiple Vitamin (MULTIVITAMIN) capsule Take 1 capsule by mouth daily.      . nitroGLYCERIN (NITROSTAT) 0.4 MG SL tablet Place 1 tablet (0.4 mg total) under the tongue every 5 (five) minutes as needed for chest pain.  30 tablet  0    Allergies Review of patient's allergies  indicates no known allergies.  Family History: Family History  Problem Relation Age of Onset  . Heart disease Mother   . Depression Mother   . Diabetes Mother   . Early death Mother   . Hypertension Mother   . Hyperlipidemia Mother   . Kidney disease Father   . Early death Father   . Cancer Father   . Hypertension Sister     Social History: History   Social History  . Marital Status: Legally Separated    Spouse Name: N/A    Number of Children: N/A  . Years of Education: N/A   Occupational History  . Not on file.   Social History Main Topics  . Smoking status: Former Smoker -- 10 years    Types: Cigarettes    Quit date: 06/30/2001  . Smokeless tobacco: Not on file  . Alcohol Use: No  . Drug Use: No  . Sexually Active: Not on file   Other Topics Concern  . Not on file   Social History Narrative  . No narrative on file    Electrocardiogram:  02/29/12  NSR rate 74 normal ECG  Assessment and Plan

## 2012-03-26 NOTE — Plan of Care (Signed)
Problem: Phase II Progression Outcomes Goal: Pain controlled Outcome: Completed/Met Date Met:  03/26/12 Painfree Goal: Vascular site scale level 0 - I Vascular Site Scale Level 0: No bruising/bleeding/hematoma Level I (Mild): Bruising/Ecchymosis, minimal bleeding/ooozing, palpable hematoma < 3 cm Level II (Moderate): Bleeding not affecting hemodynamic parameters, pseudoaneurysm, palpable hematoma > 3 cm Outcome: Completed/Met Date Met:  03/26/12 Level 0 Goal: Distal pulses equal baseline assessment Outcome: Completed/Met Date Met:  03/26/12 Palpable pedal pulses bilateral Goal: Discharge plan established Outcome: Completed/Met Date Met:  03/26/12 Planned discharge tommorrow Goal: Tolerating diet Outcome: Completed/Met Date Met:  03/26/12 Tolerated lunch and dinner well

## 2012-03-27 DIAGNOSIS — I2510 Atherosclerotic heart disease of native coronary artery without angina pectoris: Secondary | ICD-10-CM

## 2012-03-27 LAB — BASIC METABOLIC PANEL
GFR calc Af Amer: >90 mL/min (ref >90)
GFR calc non Af Amer: >90 mL/min (ref >90)
Glucose, Bld: 91 mg/dL (ref 70–99)
Potassium: 3.9 mEq/L (ref 3.5–5.1)
Sodium: 141 mEq/L (ref 135–145)

## 2012-03-27 LAB — CBC
Hemoglobin: 12.1 g/dL — ABNORMAL LOW (ref 13.0–17.0)
MCHC: 34.2 g/dL (ref 30.0–36.0)
Platelets: 348 10*3/uL (ref 150–400)
RDW: 14.5 % (ref 11.5–15.5)

## 2012-03-27 MED ORDER — TICAGRELOR 90 MG PO TABS: 90.0000 mg | ORAL_TABLET | Freq: Two times a day (BID) | ORAL | Status: DC

## 2012-03-27 NOTE — Care Management Note (Signed)
    Page 1 of 1   03/27/2012     10:08:59 AM   CARE MANAGEMENT NOTE 03/27/2012  Patient:  [name] Allen, [name] Allen   Account Number:  [id number]  Date Initiated:  03/27/2012  Documentation initiated by:  Avie Arenas  Subjective/Objective Assessment:     Action/Plan:   Anticipated DC Date:  03/27/2012   Anticipated DC Plan:  HOME/SELF CARE      DC Planning Services  CM consult  Medication Assistance      Choice offered to / List presented to:             Status of service:  Completed, signed off Medicare Important Message given?   (If response is "NO", the following Medicare IM given date fields will be blank) Date Medicare IM given:   Date Additional Medicare IM given:    Discharge Disposition:  HOME/SELF CARE  Per UR Regulation:    If discussed at Long Length of Stay Meetings, dates discussed:    Comments:  03-27-12 - 10am Avie Arenas, RNBSN [phone] Discharging home on brilinta - brilinta card given to patient with instructions and importance of continuing to take meds.

## 2012-03-27 NOTE — Progress Notes (Signed)
    SUBJECTIVE: No chest pain or SOB. No events.   BP 128/70  Pulse 53  Temp 97.8 F (36.6 C) (Oral)  Resp 18  Ht 5\' 7"  (1.702 m)  Wt 155 lb 10.3 oz (70.6 kg)  BMI 24.38 kg/m2  SpO2 99%  Intake/Output Summary (Last 24 hours) at 03/27/12 0730 Last data filed at 03/27/12 0505  Gross per 24 hour  Intake    715 ml  Output   2675 ml  Net  -1960 ml    PHYSICAL EXAM General: Well developed, well nourished, in no acute distress. Alert and oriented x 3.  Psych:  Good affect, responds appropriately Neck: No JVD. No masses noted.  Lungs: Clear bilaterally with no wheezes or rhonci noted.  Heart: RRR with no murmurs noted. Abdomen: Bowel sounds are present. Soft, non-tender.  Extremities: No lower extremity edema. Right groin soft. No hematoma.   LABS: Basic Metabolic Panel:  Basename 03/27/12 0320 03/24/12 1237  NA 141 139  K 3.9 3.7  CL 107 106  CO2 23 26  GLUCOSE 91 91  BUN 10 7  CREATININE 0.80 0.9  CALCIUM 8.8 9.0  MG -- --  PHOS -- --   CBC:  Basename 03/27/12 0320 03/24/12 1237  WBC 10.7* 10.1  NEUTROABS -- 5.6  HGB 12.1* 12.3*  HCT 35.4* 37.7*  MCV 86.3 90.8  PLT 348 406.0*   Cardiac Enzymes:  Basename 03/27/12 0320 03/26/12 2029 03/26/12 1422  CKTOTAL 104 116 102  CKMB 1.7 2.2 2.1  CKMBINDEX -- -- --  TROPONINI -- -- --    Current Meds:    . aspirin      . aspirin  324 mg Oral Pre-Cath  . aspirin  81 mg Oral Daily  . atorvastatin  40 mg Oral Q2000  . bivalirudin      . fentaNYL      . heparin      . lidocaine      . lisinopril  5 mg Oral Daily  . metoprolol succinate  12.5 mg Oral Daily  . midazolam      . midazolam      . multivitamin with minerals  1 tablet Oral Daily  . nitroGLYCERIN      . Ticagrelor      . Ticagrelor  90 mg Oral BID  . DISCONTD: aspirin  81 mg Oral Daily  . DISCONTD: multivitamin  1 capsule Oral Daily  . DISCONTD: sodium chloride  3 mL Intravenous Q12H     ASSESSMENT AND PLAN:  1. CAD: Pt admitted after PCI  of RCA yesterday with DES in mid RCA. He did well overnight. Will need ASA and Brilinta for one year. Continue statin and beta blocker.   2. Dispo: D/C home this am.  Follow up with Dr. Myrtis Ser in 3-4 weeks. Will need Brilinta starter packet.   [name] Allen  7/4/20137:30 AM

## 2012-03-27 NOTE — Plan of Care (Signed)
Problem: Phase III Progression Outcomes Goal: No angina with increased activity Outcome: Completed/Met Date Met:  03/27/12 No complaints of chest pain shortness of breath or nausea Goal: Tolerating diet Outcome: Completed/Met Date Met:  03/27/12 Diet tolerated yesterday post cath and breakfast this am Goal: Discharge plan remains appropriate-arrangements made Outcome: Completed/Met Date Met:  03/27/12 Discharge planned for 7/4 Goal: Vital signs stable Outcome: Completed/Met Date Met:  03/27/12 VSS Goal: Vascular site scale level 0 - I Vascular Site Scale Level 0: No bruising/bleeding/hematoma Level I (Mild): Bruising/Ecchymosis, minimal bleeding/ooozing, palpable hematoma < 3 cm Level II (Moderate): Bleeding not affecting hemodynamic parameters, pseudoaneurysm, palpable hematoma > 3 cm  Outcome: Completed/Met Date Met:  03/27/12 Level 1 some bruising at site, no swelling or bleeding, slightly tender Goal: Cardiac Rehab if ordered Outcome: Completed/Met Date Met:  03/27/12 Reviewed all information with patient regarding diet, medications, cad, stent placement and care post stent and activity, reviewed discharge instructions  Problem: Discharge Progression Outcomes Goal: Discharge plan in place and appropriate Outcome: Completed/Met Date Met:  03/27/12 Discharge today Goal: Pain controlled with appropriate interventions Outcome: Completed/Met Date Met:  03/27/12 Painfree today Goal: Hemodynamically stable Outcome: Completed/Met Date Met:  03/27/12 VSS Goal: Ambulates up to 600 ft. in hall x 1 Outcome: Completed/Met Date Met:  03/27/12 Ambulating in hall with steady gait and no s/s intolerance noted Goal: Tolerating diet Outcome: Completed/Met Date Met:  03/27/12 Diet tolerated Goal: Activity appropriate for discharge plan Outcome: Completed/Met Date Met:  03/27/12 Activity up in room and ambulating, dressed independently with no s/s intolerance vascular site stable Goal:  Vascular site scale level 0 - I Vascular Site Scale Level 0: No bruising/bleeding/hematoma Level I (Mild): Bruising/Ecchymosis, minimal bleeding/ooozing, palpable hematoma < 3 cm Level II (Moderate): Bleeding not affecting hemodynamic parameters, pseudoaneurysm, palpable hematoma > 3 cm  Outcome: Completed/Met Date Met:  03/27/12 Level 1 bruising noted with no swelling or bleeding, tenderness from yesterday much less

## 2012-03-27 NOTE — Discharge Summary (Signed)
CARDIOLOGY DISCHARGE SUMMARY   Patient ID: [name] Allen MRN: [id number] DOB/AGE: 1951-06-29 61 y.o.  Admit date: 03/26/2012 Discharge date: 03/27/2012  Primary Discharge Diagnosis: Anginal chest pain, s/p 2.5 x 12 mm Evolve 2 study stent to the RCA  Secondary Discharge Diagnosis:  Past Medical History  Diagnosis Date  . Anxiety   . Irregular heart beat 2003  . Coronary artery disease 2003    status post CABG 2003  . Dysrhythmia 2003    irregular  . Anginal pain   . Exertional dyspnea   . Depression ~ 2005   Procedures: Left Heart Cath, Selective Coronary Angiography, LV angiography, PTCA/Stent of the RCA  Hospital Course: [name] Allen is a 61 year old male with a history of coronary artery disease. He had anginal chest pain and was seen in the office by Dr. Eden Emms. His symptoms were concerning for crescendo anginal pain, so he was scheduled for outpatient cardiac catheterization. He came to the hospital for the procedure on 03/26/2012.  The cardiac catheterization results are listed below. He had a stent to the RCA with TIMI 3 flow post procedure. He tolerated the procedure well.  On 03/27/2012, he was seen by Dr. Clifton Manvir. He was ambulating without chest pain or shortness of breath and is considered stable for discharge, to follow up as an outpatient.  Labs:   Lab Results  Component Value Date   WBC 10.7* 03/27/2012   HGB 12.1* 03/27/2012   HCT 35.4* 03/27/2012   MCV 86.3 03/27/2012   PLT 348 03/27/2012    Lab 03/27/12 0320  NA 141  K 3.9  CL 107  CO2 23  BUN 10  CREATININE 0.80  CALCIUM 8.8  PROT --  BILITOT --  ALKPHOS --  ALT --  AST --  GLUCOSE 91    Basename 03/27/12 0320 03/26/12 2029 03/26/12 1422  CKTOTAL 104 116 102  CKMB 1.7 2.2 2.1  CKMBINDEX -- -- --  TROPONINI -- -- --     Basename 03/24/12 1237  INR 1.0   Cardiac Cath:  Left mainstem: Normal.  Left anterior descending (LAD): There is mild ostial LAD disease up to 30-40%. There are mild  irregularities in the mid vessel up to 20%.  Left circumflex (LCx): The left circumflex gives rise to a single marginal branch and then terminates in the AV groove. Has minor irregularities less than 10%.  Right coronary artery (RCA): The right coronary is a dominant vessel. In the mid vessel there is a focal 99% stenosis. This did not change with administration of intracoronary nitroglycerin.  The LIMA graft to the LAD is patent but there is significant competitive flow from the native LAD and there is very little antegrade flow in the IMA graft.  Left ventriculography: Left ventricular systolic function is normal, LVEF is estimated at 55-65%, there is no significant mitral regurgitation  RCA/Segment - mid - s/p 2.5 x 12 mm Evolve 2 study stent  EKG: 27-Mar-2012 05:01:21  Sinus bradycardia Nonspecific T wave abnormality Abnormal ECG 35mm/s 72mm/mV 100Hz  8.0.1 12SL 239 CID: 1 Referred by: Demetria Pore [place] Unconfirmed Vent. rate 50 BPM PR interval 178 ms QRS duration 92 ms QT/QTc 436/397 ms P-R-T axes 64 66 52  FOLLOW UP PLANS AND APPOINTMENTS No Known Allergies Medication List  As of 03/27/2012  9:11 AM   TAKE these medications         aspirin 81 MG tablet   Take 81 mg by mouth daily.      atorvastatin  40 MG tablet   Commonly known as: LIPITOR   Take 1 tablet (40 mg total) by mouth daily.      lisinopril 5 MG tablet   Commonly known as: PRINIVIL,ZESTRIL   Take 1 tablet (5 mg total) by mouth daily.      metoprolol succinate 25 MG 24 hr tablet   Commonly known as: TOPROL-XL   Take 0.5 tablets (12.5 mg total) by mouth daily.      multivitamin capsule   Take 1 capsule by mouth daily.      nitroGLYCERIN 0.4 MG SL tablet   Commonly known as: NITROSTAT   Place 1 tablet (0.4 mg total) under the tongue every 5 (five) minutes as needed for chest pain.      Ticagrelor 90 MG Tabs tablet   Commonly known as: BRILINTA   Take 1 tablet (90 mg total) by mouth 2 (two) times daily.              Discharge Orders    Future Appointments: Provider: Department: Dept Phone: Center:   04/01/2012 10:30 AM Lajoyce Corners Tye Savoy, MD [facility] [phone] [facility]   04/07/2012 11:30 AM Beatrice Lecher, PA [facility] [phone] [facility]   06/05/2012 10:30 AM Luis Abed, MD [facility] [phone] [facility]       BRING ALL MEDICATIONS WITH YOU TO FOLLOW UP APPOINTMENTS  Time spent with patient to include physician time: 35 min Signed: Theodore Demark 03/27/2012, 9:06 AM Co-Sign MD

## 2012-03-28 MED FILL — Dextrose Inj 5%: INTRAVENOUS | Qty: 50 | Status: AC

## 2012-04-01 ENCOUNTER — Encounter: Payer: Self-pay | Admitting: Family Medicine

## 2012-04-01 ENCOUNTER — Ambulatory Visit (INDEPENDENT_AMBULATORY_CARE_PROVIDER_SITE_OTHER): Payer: Medicaid Other | Admitting: Family Medicine

## 2012-04-01 VITALS — BP 119/80 | HR 66 | Temp 97.4°F | Ht 67.0 in | Wt 156.7 lb

## 2012-04-01 DIAGNOSIS — I10 Essential (primary) hypertension: Secondary | ICD-10-CM

## 2012-04-01 DIAGNOSIS — Z9189 Other specified personal risk factors, not elsewhere classified: Secondary | ICD-10-CM

## 2012-04-01 NOTE — Assessment & Plan Note (Signed)
BP well controlled.  Continue Metoprolol and Lisinopril.  BMET was within normal limits.

## 2012-04-01 NOTE — Progress Notes (Signed)
  Subjective:    Patient ID: [name] Allen, male    DOB: 07/29/1951, 61 y.o.   MRN: [id number]  HPI  Patient presents to clinic for follow up after cardiac cath and stent placement on 03/27/12.  Patient says he is feeling well currently.  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  Patient is a poor historian, but he remembers feeling his heart racing and mild chest discomfort.  His boss gave him a NTG tablet and he calmed down.  He does not recall losing consciousness.  He does not call any SOB, numbness/tingling of arm or jaw, nausea or vomiting.  Today, patient is sitting comfortably and has no complaints.  That day, his boss told him he was "high risk" and patient lost his job.  Patient would like to know more about applying for disability.   Review of Systems  Per HPI    Objective:   Physical Exam  Constitutional: No distress.  Neck: Normal range of motion. Neck supple. No JVD present.  Cardiovascular: Normal rate and regular rhythm.   Murmur heard. Pulmonary/Chest: Breath sounds normal. He has no wheezes. He has no rales.  Musculoskeletal: He exhibits no edema.          Assessment & Plan:

## 2012-04-01 NOTE — Assessment & Plan Note (Signed)
Episode described at work may have been due to over-exertion after recent cath vs. Anxiety attack. Continue NTG as needed and I encouraged resting and increasing activity slowly until seen by Cards on 7/15. He used to see a psychiatrist before he lost insurance.  Encouraged patient to call and schedule appointment with him now that he has Medicaid. Will hold off on disability for now; patient may be able to find light duty.  Will route to our SW, Spurgeon, to see if she has any suggestions.

## 2012-04-01 NOTE — Patient Instructions (Signed)
Your BP looks great today. Continue taking current medications. Ask your cardiologist about Brilinta. 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 Schedule follow up appointment with me in 2 months.

## 2012-04-07 ENCOUNTER — Encounter: Payer: Self-pay | Admitting: Physician Assistant

## 2012-04-07 ENCOUNTER — Ambulatory Visit (INDEPENDENT_AMBULATORY_CARE_PROVIDER_SITE_OTHER): Payer: Medicaid Other | Admitting: Physician Assistant

## 2012-04-07 VITALS — BP 118/67 | HR 59 | Ht 67.0 in | Wt 157.0 lb

## 2012-04-07 DIAGNOSIS — I10 Essential (primary) hypertension: Secondary | ICD-10-CM

## 2012-04-07 DIAGNOSIS — I2510 Atherosclerotic heart disease of native coronary artery without angina pectoris: Secondary | ICD-10-CM

## 2012-04-07 DIAGNOSIS — Z9189 Other specified personal risk factors, not elsewhere classified: Secondary | ICD-10-CM

## 2012-04-07 DIAGNOSIS — E7800 Pure hypercholesterolemia, unspecified: Secondary | ICD-10-CM

## 2012-04-07 MED ORDER — ISOSORBIDE MONONITRATE ER 30 MG PO TB24: 15.0000 mg | ORAL_TABLET | Freq: Every day | ORAL | Status: DC

## 2012-04-07 MED ORDER — NITROGLYCERIN 0.4 MG SL SUBL: 0.4000 mg | SUBLINGUAL_TABLET | SUBLINGUAL | Status: DC | PRN

## 2012-04-07 MED ORDER — TICAGRELOR 90 MG PO TABS: 90.0000 mg | ORAL_TABLET | Freq: Two times a day (BID) | ORAL | Status: DC

## 2012-04-07 NOTE — Patient Instructions (Addendum)
START IMDUR 30 MG TABLET ; TAKE 1/2 (HALF) TABLET DAILY TO = 15 MG DAILY  KEEP APPOINTMENT WITH DR. KATZ 06/05/12

## 2012-04-07 NOTE — Progress Notes (Signed)
[address] [place], [place]  [address] Phone: [phone] Fax:  [phone]  Date:  04/07/2012   Name:  [name] Allen   DOB:  05-20-1951   MRN:  [id number]  PCP:  Barnabas Lister, MD  Primary Cardiologist:  Dr. Zackery Barefoot  Primary Electrophysiologist:  None    History of Present Illness: [name] Allen is a 61 y.o. male who returns for post hospital follow up.  He has a history of CAD, status post CABG in 2005, prior EF 45-50%, HL.  He was last seen in our office in 2008.  He was then seen by Dr. Eden Emms on 7/1 with chest discomfort like his previous angina.  He was set up for [facility].  LHC 03/26/12: Ostial LAD 30-40%, mid LAD 20%, circumflex with minor irregularities less than 10%, mid RCA 99%, LIMA-LAD patent but with significant competitive flow from the native LAD, EF 55-65%.  PCI: Evolve 2 study stent to the mid RCA.  He was discharged to home the next day.  Recommendation was for dual antiplatelet therapy for one year.  He continues to note chest discomfort.  It seems to come on mainly with emotional stress.  He also notes it with exertion.  Overall his chest symptoms are improved since PCI.  These have been fairly stable since discharge from the hospital without significant change.  He has a substernal tightness without radiation.  He does note associated diaphoresis and shortness of breath.  He denies orthopnea, PND or edema.  He denies syncope.  He has been taking his aspirin and Brilinta.   Potassium  Date/Time Value Range Status  03/27/2012  3:20 AM 3.9  3.5 - 5.1 mEq/L Final   Creatinine, Ser  Date/Time Value Range Status  03/27/2012  3:20 AM 0.80  0.50 - 1.35 mg/dL Final     ALT  Date/Time Value Range Status  02/29/2012  4:09 PM 12  0 - 53 U/L Final     Past Medical History  Diagnosis Date  . Anxiety   . Coronary artery disease 2003    status post CABG 2003;  LHC 03/26/12: Ostial LAD 30-40%, mid LAD 20%, circumflex with minor irregularities less than  10%, mid RCA 99%, LIMA-LAD patent but with significant competitive flow from the native LAD, EF 55-65%.  PCI: Evolve 2 study stent to the mid RCA.  [name] Dysrhythmia 2003    irregular  . Depression ~ 2005  . HLD (hyperlipidemia)     Current Outpatient Prescriptions  Medication Sig Dispense Refill  . aspirin 81 MG tablet Take 81 mg by mouth daily.      [name] atorvastatin (LIPITOR) 40 MG tablet Take 1 tablet (40 mg total) by mouth daily.  30 tablet  3  . fish oil-omega-3 fatty acids 1000 MG capsule Take 1 g by mouth daily.      [name] lisinopril (PRINIVIL,ZESTRIL) 5 MG tablet Take 1 tablet (5 mg total) by mouth daily.  90 tablet  3  . metoprolol succinate (TOPROL-XL) 25 MG 24 hr tablet Take 0.5 tablets (12.5 mg total) by mouth daily.  90 tablet  3  . nitroGLYCERIN (NITROSTAT) 0.4 MG SL tablet Place 1 tablet (0.4 mg total) under the tongue every 5 (five) minutes as needed for chest pain.  30 tablet  0  . Ticagrelor (BRILINTA) 90 MG TABS tablet Take 1 tablet (90 mg total) by mouth 2 (two) times daily.  60 tablet  11    Allergies: No Known  Allergies  History  Substance Use Topics  . Smoking status: Former Smoker -- 0.1 packs/day for 10 years    Types: Cigarettes    Quit date: 06/30/2001  . Smokeless tobacco: Never Used  . Alcohol Use: Yes     "drink alcohol once/yr; motorcycle ralley"     ROS:  Please see the history of present illness.   No fevers, chills, cough, wheezing, melena, hematochezia, dysphagia, odynophagia, dyspepsia.  All other systems reviewed and negative.   PHYSICAL EXAM: VS:  BP 118/67  Pulse 59  Ht 5\' 7"  (1.702 m)  Wt 157 lb (71.215 kg)  BMI 24.59 kg/m2 Well nourished, well developed, in no acute distress HEENT: normal Neck: no JVD Vascular: No carotid bruits Cardiac:  normal S1, S2; RRR; no murmur Lungs:  clear to auscultation bilaterally, no wheezing, rhonchi or rales Abd: soft, nontender, no hepatomegaly Ext: no edema;  right groin without hematoma or bruit  Skin:  warm and dry Neuro:  CNs 2-12 intact, no focal abnormalities noted  EKG:  Sinus bradycardia, heart rate 58, normal axis, poor R-wave progression, no ischemic changes      ASSESSMENT AND PLAN:  1.  CAD Doing well post PCI.  His symptoms have overall improved. We discussed the importance of dual antiplatelet therapy.  We will make sure that he has the proper paperwork filled out for his Brilinta. Keep followup with Dr. Myrtis Ser 9/12.  2.  Chest Pain Question if this is related to emotional stress. He could certainly have small vessel disease.  He does have an exertional component. Overall, his symptoms have improved since PCI.  He did not really have any significant disease elsewhere.  I do not think he needs further ischemic testing at this time. I will place him on Imdur were 15 mg a day to see if this helps his symptoms. He does not take PDE inhibitors. He will follow up with Dr. Myrtis Ser as scheduled.  Return sooner if his symptoms should change or worsen.  3.  Hyperlipidemia Managed by PCP.  Goal LDL < 70.  4.  Hypertension Controlled.  Continue current therapy.   5.  Anxiety He should discuss this with his PCP.  He may benefit from counseling and/or anxiolytics.  [name], PA-C  12:35 PM 04/07/2012

## 2012-04-08 ENCOUNTER — Telehealth: Payer: Self-pay | Admitting: Clinical

## 2012-04-08 NOTE — Telephone Encounter (Signed)
Clinical Child psychotherapist (CSW) contacted pt to discuss the disability process. Pt stated he applied for disability 3-4 months ago, was seen by the [facility] doctors and denied disability. Pt stated he recently had heart surgery July 2nd however did not follow up with [employer]. CSW encouraged pt to contact [employer] and provide them with updated information. CSW also informed pt that CSW will place referral to the [facility] where they will assist pt in the disability process. Pt appreciative of CSW call and will follow up after speaking with [employer].  Theresia Bough, MSW, Theresia Majors [phone]

## 2012-04-15 ENCOUNTER — Telehealth: Payer: Self-pay | Admitting: Cardiovascular Disease

## 2012-04-15 NOTE — Telephone Encounter (Signed)
Per [facility] [address], pt's rx for PRILINTA @ [phone]  Id [id number] need prior auth

## 2012-04-18 ENCOUNTER — Encounter: Payer: Self-pay | Admitting: Family Medicine

## 2012-04-18 ENCOUNTER — Ambulatory Visit (INDEPENDENT_AMBULATORY_CARE_PROVIDER_SITE_OTHER): Payer: Medicaid Other | Admitting: Family Medicine

## 2012-04-18 ENCOUNTER — Ambulatory Visit
Admission: RE | Admit: 2012-04-18 | Discharge: 2012-04-18 | Disposition: A | Discharge status: Home / Self Care | Payer: Medicaid Other | Source: Ambulatory Visit | Attending: Family Medicine | Admitting: Family Medicine

## 2012-04-18 VITALS — BP 107/67 | HR 65 | Temp 97.8°F | Ht 67.0 in | Wt 157.1 lb

## 2012-04-18 DIAGNOSIS — M546 Pain in thoracic spine: Secondary | ICD-10-CM

## 2012-04-18 LAB — POCT URINALYSIS DIPSTICK
Glucose, UA: NEGATIVE
Leukocytes, UA: NEGATIVE
Nitrite, UA: NEGATIVE
Spec Grav, UA: 1.015
Urobilinogen, UA: 0.2

## 2012-04-18 MED ORDER — HYDROCODONE-ACETAMINOPHEN 5-500 MG PO TABS: 1.0000 | ORAL_TABLET | Freq: Three times a day (TID) | ORAL | Status: AC | PRN

## 2012-04-18 MED ORDER — METHOCARBAMOL 500 MG PO TABS: 1000.0000 mg | ORAL_TABLET | Freq: Three times a day (TID) | ORAL | Status: AC | PRN

## 2012-04-18 NOTE — Progress Notes (Signed)
  Subjective:    Patient ID: [name] Allen, male    DOB: Sep 24, 1951, 61 y.o.   MRN: [id number]  HPI  1. Back pain. Patient has been experiencing intermittent low thoracic back pain for the past 6-7 months. He does not recall any triggering factor. States he has injured his back 3 years ago when he was helping to load a motor with a friend, suffered a hernia and underwent operative repair. No significant injuries since then. Patient is a poor historian, but describes pain that is constant, radiates to left lank, causes him to limp. Feels sharp. Rates as 8/10. Notably he had a cardiac stent placed less than one month ago. Patient has tried heating pad, Motrin without much improvement  He denies any hematuria, dysuria, fever, weight loss, rash, abdominal pain, nausea, vomiting, difficulty with urination, incontinence.  Review of Systems See HPI otherwise negative.  reports that he quit smoking about 10 years ago. His smoking use included Cigarettes. He has a 1 pack-year smoking history. He has never used smokeless tobacco.     Objective:   Physical Exam  Vitals reviewed. Constitutional: He is oriented to person, place, and time. He appears well-developed and well-nourished. No distress.  HENT:  Head: Normocephalic and atraumatic.  Mouth/Throat: Oropharynx is clear and moist.  Eyes: EOM are normal. Pupils are equal, round, and reactive to light.  Neck: Normal range of motion. Neck supple.  Cardiovascular: Normal rate, regular rhythm and normal heart sounds.   Pulmonary/Chest: Breath sounds normal. No respiratory distress. He has no wheezes. He has no rales.  Abdominal: Soft. Bowel sounds are normal. He exhibits no distension. There is no tenderness. There is no rebound.  Musculoskeletal: He exhibits tenderness. He exhibits no edema.       Range of motion diminished with lumbar flexion, extension. Contralateral pain with lateral flexion on the left greater than right. Hypertonicity in left>right  thoracic paraspinal musculature radiating to left flank. No bony deformity noted. Negative straight leg test, negative Spurling test. Gait antalgic.   Lymphadenopathy:    He has no cervical adenopathy.  Neurological: He is alert and oriented to person, place, and time. He displays normal reflexes. No cranial nerve deficit. He exhibits normal muscle tone. Coordination normal.  Skin: No rash noted. He is not diaphoretic.  Psychiatric: He has a normal mood and affect.        Assessment & Plan:

## 2012-04-18 NOTE — Patient Instructions (Addendum)
You seem to have a muscle strain in your back. WIll check xray since it has been going on for so long. You may take robaxin for muscle spasm (may cause drowsiness) and hydrocodone-acet for pain. Please make an appointment in 1-2 weeks for follow up. If you have fevers, worsening pain, weakness or numbness then come back sooner.  Back Pain, Adult Back pain is very common. The pain often gets better over time. The cause of back pain is usually not dangerous. Most people can learn to manage their back pain on their own.  HOME CARE   Stay active. Start with short walks on flat ground if you can. Try to walk farther each day.   Do not sit, drive, or stand in one place for more than 30 minutes. Do not stay in bed.   Do not avoid exercise or work. Activity can help your back heal faster.   Be careful when you bend or lift an object. Bend at your knees, keep the object close to you, and do not twist.   Sleep on a firm mattress. Lie on your side, and bend your knees. If you lie on your back, put a pillow under your knees.   Only take medicines as told by your doctor.   Put ice on the injured area.   Put ice in a plastic bag.   Place a towel between your skin and the bag.   Leave the ice on for 15 to 20 minutes, 3 to 4 times a day for the first 2 to 3 days. After that, you can switch between ice and heat packs.   Ask your doctor about back exercises or massage.   Avoid feeling anxious or stressed. Find good ways to deal with stress, such as exercise.  GET HELP RIGHT AWAY IF:   Your pain does not go away with rest or medicine.   Your pain does not go away in 1 week.   You have new problems.   You do not feel well.   The pain spreads into your legs.   You cannot control when you poop (bowel movement) or pee (urinate).   Your arms or legs feel weak or lose feeling (numbness).   You feel sick to your stomach (nauseous) or throw up (vomit).   You have belly (abdominal) pain.    You feel like you may pass out (faint).  MAKE SURE YOU:   Understand these instructions.   Will watch your condition.   Will get help right away if you are not doing well or get worse.  Document Released: 02/27/2008 Document Revised: 08/30/2011 Document Reviewed: 01/29/2011 [facility] Patient Information 2012 [place], [place].

## 2012-04-18 NOTE — Assessment & Plan Note (Signed)
Consistent with thoracolumbar muscular strain. No red flags, UA neg, but atypical with time course of 6-7 months. Thoracolumbar plain films ordered. Recommend continuation with conservative management including heat therapy, stretching. Prescribe Robaxin when necessary. Avoid excess NSAIDs in setting of recent CAD. Follow up in 2 weeks with PCP.

## 2012-04-22 NOTE — Telephone Encounter (Signed)
ATTEMPTED ,TO GET PRIOR AUTH   FOR PT'S MED ON HOLD FOR  50 MIN  UNABLE TO HOLD ANY LONGER .[name]

## 2012-04-30 NOTE — Telephone Encounter (Signed)
SAMPLES OF BRILINTA  LEFT AT FRONT DESK./CY

## 2012-05-01 ENCOUNTER — Telehealth: Payer: Self-pay | Admitting: Family Medicine

## 2012-05-01 NOTE — Telephone Encounter (Signed)
Patient is calling for results of his xray from 7/26.

## 2012-05-01 NOTE — Telephone Encounter (Signed)
Please call patient and let him know plain films did show mild degenerative joint disease, but NO acute fractures.  Thanks.

## 2012-05-01 NOTE — Telephone Encounter (Signed)
Will forward to Dr de la cruz 

## 2012-05-01 NOTE — Telephone Encounter (Signed)
Advised pt of xray results, sched F/U appt for 8/13, pt states Robaxin did not help and is out of his Vicodin,requests refill.

## 2012-05-06 ENCOUNTER — Encounter: Payer: Self-pay | Admitting: Family Medicine

## 2012-05-06 ENCOUNTER — Ambulatory Visit (INDEPENDENT_AMBULATORY_CARE_PROVIDER_SITE_OTHER): Payer: Medicaid Other | Admitting: Family Medicine

## 2012-05-06 ENCOUNTER — Telehealth: Payer: Self-pay | Admitting: *Deleted

## 2012-05-06 VITALS — BP 125/88 | HR 80 | Temp 98.7°F | Ht 67.0 in | Wt 157.5 lb

## 2012-05-06 DIAGNOSIS — R109 Unspecified abdominal pain: Secondary | ICD-10-CM

## 2012-05-06 LAB — POCT URINALYSIS DIPSTICK
Leukocytes, UA: NEGATIVE
Protein, UA: NEGATIVE
Urobilinogen, UA: 0.2

## 2012-05-06 MED ORDER — METRONIDAZOLE 500 MG PO TABS: 500.0000 mg | ORAL_TABLET | Freq: Two times a day (BID) | ORAL | Status: AC

## 2012-05-06 MED ORDER — KETOROLAC TROMETHAMINE 60 MG/2ML IM SOLN
60.0000 mg | Freq: Once | INTRAMUSCULAR | Status: AC
  Administered 2012-05-06: 60 mg via INTRAMUSCULAR

## 2012-05-06 MED ORDER — DICLOFENAC SODIUM 1 % TD GEL: 1.0000 "application " | Freq: Four times a day (QID) | TRANSDERMAL | Status: DC

## 2012-05-06 MED ORDER — LIDOCAINE 5 % EX PTCH: 1.0000 | MEDICATED_PATCH | CUTANEOUS | Status: DC

## 2012-05-06 NOTE — Patient Instructions (Addendum)
[name] Allen, it was nice to see today.  I am sorry you are not feeling better. For localized right side pain, please apply Lidoderm patch to skin every 24 hours. I will refer you to physical therapy. I will call you with the results of your urinalysis or abnormal. Please schedule followup appointment with me in 4-6 weeks or sooner as needed. Return to clinic or report to the emergency room if you develop worsening pain, nausea, vomiting, associated fevers.

## 2012-05-06 NOTE — Telephone Encounter (Signed)
PA required for Lidoderm patch. Form placed in MD box.

## 2012-05-06 NOTE — Telephone Encounter (Signed)
Changed therapy to Voltaren Gel.  No PA required hopefully.

## 2012-05-07 ENCOUNTER — Encounter: Payer: Self-pay | Admitting: Family Medicine

## 2012-05-07 DIAGNOSIS — R109 Unspecified abdominal pain: Secondary | ICD-10-CM | POA: Insufficient documentation

## 2012-05-07 NOTE — Assessment & Plan Note (Signed)
Likely musculoskeletal, but will order UA to rule out UTI vs. Kidney stone. Will also treat with Flagyl due to questionable history of girlfriend diagnosed with Trich a few months ago. Toradol IM given in office today - patient felt better 15 minutes after administration. Will also give Rx for Voltaren gel and refer to physical therapy. Follow up in 4 weeks or sooner as needed.

## 2012-05-07 NOTE — Progress Notes (Signed)
  Subjective:    Patient ID: [name] Allen, male    DOB: 1951/05/23, 61 y.o.   MRN: [id number]  HPI  Patient presents to clinic for follow up back pain.  Patient was seen a few weeks ago; Dr. Cristal Ford ordered plain films of thoracic and lumbar spine which showed mild DJD.  Patient took both Vicodin and Motrin with little relief.  He started using his friend's pain medication but could not remember the name of it.  Patient says pain has become worse.  Now it is located RT lower back and RT flank.  He describes pain as stabbing and constant.  He says that at one point, pain was so severe, he had to lay on his kitchen floor and could not stand up to call ambulance.  He denies any thoracic back pain.    Patient is a poor historian - his speech is difficult to understand at times and he does not seem to answer my questions appropriately.  Patient denies any associated fever, chills, nausea/vomiting, dysuria, hematuria.  Denies any loss of bladder or bowel function.  Denies any numbness/tingling sensation of lower extremities.    Patient denies recent sexual activity.  He says he has not had any sexual intercourse in the last 2 months.  However, he tell me that his girlfriend was told she had Trich and that he needed to be treated also.  Patient denies any current penile discharge.   Review of Systems  Per HPI    Objective:   Physical Exam  Constitutional: No distress.  Musculoskeletal:       Low back: tenderness on palpation of RT flank and RT paraspinal muscles; unable to fully flex or extend due to pain; normal sensation; no bony tenderness of lumbar spine Gait: leans over to left side due to pain in RT side  Neurological: He is alert. No cranial nerve deficit.  Skin:       No evidence of rash, bruises, or lesions on back          Assessment & Plan:

## 2012-05-15 ENCOUNTER — Ambulatory Visit: Payer: Medicaid Other | Admitting: Physical Therapy

## 2012-05-23 NOTE — Telephone Encounter (Signed)
PER INSURANCE CO PT NEEDS TO TRY ANOTHER  THERAPY  AND FAIL BEFORE  BRLINITA WILL BE COVERED  CHOICES ARE  AGGRENOX, PLAVIX,OR TICLID  AFTER REVIEWING CHART APPEARS PT HAS NOT TRIED SAID MEDS WILL FORWARD TO DR Myrtis Ser FOR REIVEW .[name]

## 2012-05-26 NOTE — Telephone Encounter (Signed)
Pt. Can be changed to Plavix 75mg  daily when his Brilinta runs out.

## 2012-05-27 MED ORDER — CLOPIDOGREL BISULFATE 75 MG PO TABS: 75.0000 mg | ORAL_TABLET | Freq: Every day | ORAL | Status: DC

## 2012-05-27 NOTE — Telephone Encounter (Signed)
Pt notified and [facility] request.

## 2012-06-03 ENCOUNTER — Ambulatory Visit (HOSPITAL_COMMUNITY)
Admission: RE | Admit: 2012-06-03 | Discharge: 2012-06-03 | Disposition: A | Discharge status: Home / Self Care | Payer: Medicaid Other | Source: Ambulatory Visit | Attending: Family Medicine | Admitting: Family Medicine

## 2012-06-03 ENCOUNTER — Encounter: Payer: Self-pay | Admitting: Family Medicine

## 2012-06-03 ENCOUNTER — Ambulatory Visit (INDEPENDENT_AMBULATORY_CARE_PROVIDER_SITE_OTHER): Payer: Self-pay | Admitting: Family Medicine

## 2012-06-03 VITALS — BP 119/77 | HR 72 | Temp 97.0°F | Ht 67.0 in | Wt 157.0 lb

## 2012-06-03 DIAGNOSIS — I10 Essential (primary) hypertension: Secondary | ICD-10-CM | POA: Insufficient documentation

## 2012-06-03 DIAGNOSIS — Z9189 Other specified personal risk factors, not elsewhere classified: Secondary | ICD-10-CM

## 2012-06-03 DIAGNOSIS — R9430 Abnormal result of cardiovascular function study, unspecified: Secondary | ICD-10-CM | POA: Insufficient documentation

## 2012-06-03 DIAGNOSIS — R079 Chest pain, unspecified: Secondary | ICD-10-CM | POA: Insufficient documentation

## 2012-06-03 DIAGNOSIS — F419 Anxiety disorder, unspecified: Secondary | ICD-10-CM | POA: Insufficient documentation

## 2012-06-03 DIAGNOSIS — M546 Pain in thoracic spine: Secondary | ICD-10-CM

## 2012-06-03 DIAGNOSIS — I2510 Atherosclerotic heart disease of native coronary artery without angina pectoris: Secondary | ICD-10-CM | POA: Insufficient documentation

## 2012-06-03 MED ORDER — ISOSORBIDE MONONITRATE ER 30 MG PO TB24: 30.0000 mg | ORAL_TABLET | Freq: Every day | ORAL | Status: DC

## 2012-06-03 NOTE — Patient Instructions (Addendum)
Your EKG was normal today. Start taking IMDUR 30 daily for ongoing chest pain. If chest pain persists, you may take sublingual NTG as needed. If your CP worsens or you develop nausea/vomiting, excessive sweating, or shortness of breath, please report to ER. Follow up with your cardiologist on Thursday. I will put in a referral for Cardiac rehab - we will call you with time and date of appointment.  Angina Angina is discomfort caused by inadequate oxygen delivery to the heart muscle. Angina is a response to blockage or narrowing of the coronary arteries. It alerts you about a blood flow problem to your heart. New, more frequent, or severe angina is a warning signal for you to seek medical care right away.  CAUSES The coronary arteries supply blood and oxygen to the heart muscle. The heart muscle needs a constant supply of oxygen in order to pump blood to the body. These arteries often become blocked by hardened deposits of blood products (plaque) including fat and cholesterol. The following contribute to the formation of plaque:  High cholesterol.   High blood pressure.   Smoking.   Obesity.   Diabetes.  SYMPTOMS   The most common problem is a deep discomfort in the center of your chest. The discomfort may also be felt in, or move to your:   Arms (especially the left one).   Throat.   Jaw.   Back.   Upper stomach.   Angina may be brought on by exercise, emotional upset, heavy meals, or extremes of heat or cold.   It may resolve within 5 to 10 minutes after a period of rest.   Angina symptoms vary from person to person.   If you have angina and it is treated, it may resolve. If you feel it again, the symptoms may not be the same in both type and location.  DIAGNOSIS   Emergency room evaluation or hospital admission may be needed to determine if there are any blockages of your coronary arteries.   Blood tests, EKGs, and chest X-rays may be done.   Further testing may  include a stress test or an angiogram.   A heart specialist (cardiologist) may be asked to assist with your evaluation.   Other conditions that may feel like angina, but are not a problem with the heart include:   Muscle strain in the chest wall.   Blood clots in the lung.   Anxiety.   Acid reflux from the stomach.  RISKS AND COMPLICATIONS  Angina that is not treated or evaluated can lead to a decline in oxygen delivery to the heart muscle, a heart attack, or even death. TREATMENT  Depending on severity of the blockages and on other factors, angina may be treated with:  Lifestyle changes such as weight loss, stopping smoking, appropriate exercise, or low cholesterol and low salt diet.   Medications to control or to treat the risk factors for angina.   Procedures such as angioplasty or stent placement may allow the blockages to be opened without surgery.   Open heart surgery may be needed to bypass blocked arteries that cannot be treated by other methods.  HOME CARE INSTRUCTIONS   If your caregiver prescribed medication to control your angina, take them as directed. Report side effects. Do not stop medications or adjust the dosages on your own.   Regular exercise is good for you as long as it does not cause discomfort. Avoid activities that trigger attacks of angina. Walking is the best exercise. Do  not begin any new type of exercise until you check with your caregiver.   You may still have a sexual relationship if it does not cause angina. Tell your caregiver if it does.   Stop smoking. Do not use nicotine patches or gum until you check with your caregiver.   If you are overweight, you should lose weight. Eat a heart healthy diet that is low in fat and salt.   If your caregiver has given you a follow-up appointment, it is very important to keep that appointment. Not keeping the appointment could result in a heart attack, permanent heart damage, and disability. If there is any  problem keeping the appointment, you must call back to this facility for assistance.  SEEK MEDICAL CARE IF:   Your angina seems to be occurring more frequently or seems to be lasting longer.   You are having problems that you think may be side effects of the medicine you are taking.  SEEK IMMEDIATE MEDICAL CARE IF:   You have severe chest discomfort, especially if the pain is crushing or pressure-like and spreads to the arms, back, neck, or jaw.   You are sweating, feel sick to your stomach (nauseous), or have shortness of breath.   You have an attack of angina that does not get better after rest or taking your usual medicine.   You wake from sleep with chest pain.   You feel dizzy, faint, or experience extreme fatigue.   You have chest pain not typical of your usual angina. THIS IS AN EMERGENCY. Do not wait to see if the pain will go away. Get medical help at once. Call 911. DO NOT drive yourself to the hospital.  MAKE SURE YOU:   Understand these instructions.   Will watch your condition.   Will get help right away if you are not doing well or get worse.  Document Released: 09/10/2005 Document Revised: 08/30/2011 Document Reviewed: 04/13/2008 [facility] Patient Information 2012 [place], [place].

## 2012-06-04 ENCOUNTER — Encounter: Payer: Self-pay | Admitting: Family Medicine

## 2012-06-04 NOTE — Assessment & Plan Note (Signed)
Patient with ongoing CP since stent placement in early July 2013. EKG today showed NSR, no change since previous EKG. He is hemodynamically stable and CP has improved after NTG x 1 dose. Plan to increase Imdur to 30 daily and continue NTG PRN. Red flags reviewed that would require prompt return to clinic or to ER. Follow up with [facility] this Thursday.  Will follow up recommendations. Will refer patient to Cardiac Rehab.

## 2012-06-04 NOTE — Assessment & Plan Note (Signed)
Thoracic pain somewhat improved with Lidocaine ointment. Pain is worse with bending down and lifting heavy objects. Will start with Cardiac Rehab and increase to regular PT if patient can tolerate. Patient to return to clinic PRN for worsening symptoms.

## 2012-06-04 NOTE — Progress Notes (Signed)
  Subjective:    Patient ID: [name] Allen, male    DOB: 19-Oct-1950, 61 y.o.   MRN: [id number]  HPI  Chest Pain: Patient has had ongoing CP since cardiac cath with stent placement in July 2013.  He was seen by Cardiology and started on Imdur 15 daily.  Patient says that Imdur does not relieve CP, so he has been taking NTG SL about 3-4 times per week.  CP occurs either at rest or with exertion.  Not associated with food.  He developed CP this morning and took a NTG tablet one hour ago and says CP has improved from 8/10 to 3/10.  He says CP does not completely resolve.  He denies any SOB, nausea or vomiting.  Denies any pain radiating to arms or jaw.  Patient has a follow up appointment with Cardiology on Thursday morning.  Back/Flank Pain: Patient says back pain is still ongoing, but is not constant.  Pain is worse with bending over to pick up heavy objects (like his laptop).  Pain somewhat relieved by Lidocaine topical ointment.  Patient says "the pain is not so bad, I can live with it."  He does complain of tingling sensation in bilateral upper extremities, but not lower extremities.  He denies any associated urinary or bowel incontinence, unsteady gait, or recent falls.  Denies any dysuria or hematuria.     Review of Systems  Per HPI    Objective:   Physical Exam  Constitutional:       Sitting comfortably, in no acute distress  Neck: Normal range of motion. Neck supple.  Cardiovascular: Normal rate, regular rhythm and normal heart sounds.   Pulmonary/Chest: Effort normal and breath sounds normal. He has no wheezes. He has no rales.  Musculoskeletal: He exhibits no edema.       Tenderness on palpation of thoracic paraspinal muscles; full rotational ROM of thoracic spine; unable to flex at lumbar spine secondary to pain; normal gait           Assessment & Plan:

## 2012-06-05 ENCOUNTER — Encounter: Payer: Self-pay | Admitting: Cardiology

## 2012-06-05 ENCOUNTER — Ambulatory Visit (INDEPENDENT_AMBULATORY_CARE_PROVIDER_SITE_OTHER): Payer: Medicaid Other | Admitting: Cardiology

## 2012-06-05 VITALS — BP 132/84 | HR 72 | Resp 20 | Ht 67.0 in | Wt 158.8 lb

## 2012-06-05 DIAGNOSIS — I10 Essential (primary) hypertension: Secondary | ICD-10-CM

## 2012-06-05 DIAGNOSIS — I2510 Atherosclerotic heart disease of native coronary artery without angina pectoris: Secondary | ICD-10-CM

## 2012-06-05 NOTE — Progress Notes (Signed)
HPI  Patient returns for followup of coronary disease. He was seen last in the office on April 07, 2012 by Mr. Alben Spittle. This was a post hospital visit. The patient underwent CABG in the past. Catheterization was done in July for chest pain and he received a drug-eluting stent to the mid right coronary artery. The plan was for dual antiplatelet therapy for one year. He is on Brilinta with the plan to switch him to Plavix. He took one dose of Plavix and had significant GI symptoms. He thinks it was from the Plavix. At this point we are not absolutely sure. He is tolerating Brilinta.  Since his last visit he did have one episode of chest pain. It was sudden. I'm not convinced that it was cardiac in origin.  No Known Allergies  Current Outpatient Prescriptions  Medication Sig Dispense Refill  . aspirin 81 MG tablet Take 81 mg by mouth daily.      [name] atorvastatin (LIPITOR) 40 MG tablet Take 1 tablet (40 mg total) by mouth daily.  30 tablet  3  . diclofenac sodium (VOLTAREN) 1 % GEL Apply 1 application topically 4 (four) times daily.  100 g  5  . fish oil-omega-3 fatty acids 1000 MG capsule Take 1 g by mouth daily.      . isosorbide mononitrate (IMDUR) 30 MG 24 hr tablet Take 0.5 tablets (15 mg total) by mouth daily.  30 tablet  11  . isosorbide mononitrate (IMDUR) 30 MG 24 hr tablet Take 1 tablet (30 mg total) by mouth daily.  30 tablet  6  . lisinopril (PRINIVIL,ZESTRIL) 5 MG tablet Take 1 tablet (5 mg total) by mouth daily.  90 tablet  3  . metoprolol succinate (TOPROL-XL) 25 MG 24 hr tablet Take 0.5 tablets (12.5 mg total) by mouth daily.  90 tablet  3  . nitroGLYCERIN (NITROSTAT) 0.4 MG SL tablet Place 1 tablet (0.4 mg total) under the tongue every 5 (five) minutes as needed for chest pain.  25 tablet  11  . Ticagrelor (BRILINTA) 90 MG TABS tablet Take 1 tablet (90 mg total) by mouth 2 (two) times daily.  32 tablet  0  . clopidogrel (PLAVIX) 75 MG tablet Take 1 tablet (75 mg total) by mouth daily.   30 tablet  11    History   Social History  . Marital Status: Legally Separated    Spouse Name: N/A    Number of Children: N/A  . Years of Education: N/A   Occupational History  . Not on file.   Social History Main Topics  . Smoking status: Former Smoker -- 0.1 packs/day for 10 years    Types: Cigarettes    Quit date: 06/30/2001  . Smokeless tobacco: Never Used  . Alcohol Use: Yes     "drink alcohol once/yr; motorcycle ralley"  . Drug Use: Yes    Special: Marijuana     "tried marijuana one time in 1970; couldn't even talk"  . Sexually Active: Not Currently   Other Topics Concern  . Not on file   Social History Narrative  . No narrative on file    Family History  Problem Relation Age of Onset  . Heart disease Mother   . Depression Mother   . Diabetes Mother   . Early death Mother   . Hypertension Mother   . Hyperlipidemia Mother   . Kidney disease Father   . Early death Father   . Cancer Father   . Hypertension Sister  Past Medical History  Diagnosis Date  . Anxiety   . Coronary artery disease 2003    status post CABG 2005  //     LHC 03/26/12: Ostial LAD 30-40%, mid LAD 20%, circumflex with minor irregularities less than 10%, mid RCA 99%, LIMA-LAD patent but with significant competitive flow from the native LAD, EF 55-65%.  PCI: Evolve 2 study stent to the mid RCA.  [name] Dysrhythmia 2003    irregular  . Depression ~ 2005  . HLD (hyperlipidemia)   . Ejection fraction     EF 55-65%, catheterization, July, 2013  . Hypertension     Past Surgical History  Procedure Date  . Coronary angioplasty with stent placement 03/26/12    "1"  . Coronary artery bypass graft 2003    CABG X1  . Inguinal hernia repair ~ 2008    right    ROS   Patient denies fever, chills, headache, sweats, rash, change in vision, change in hearing, chest pain, cough, nausea vomiting, urinary symptoms. All other systems are reviewed and are negative.  PHYSICAL EXAM  He is stable  today. He is oriented to person time and place. Affect is normal. There is no jugulovenous distention. Lungs are clear. Respiratory effort is nonlabored. Cardiac exam reveals S1 and S2. There no clicks or significant murmurs. Abdomen is soft. There is no peripheral edema.  Filed Vitals:   06/05/12 1036  BP: 132/84  Pulse: 72  Resp: 20  Height: 5\' 7"  (1.702 m)  Weight: 158 lb 12 oz (72.009 kg)     ASSESSMENT & PLAN

## 2012-06-05 NOTE — Assessment & Plan Note (Signed)
Blood pressures control. No change in therapy. 

## 2012-06-05 NOTE — Assessment & Plan Note (Signed)
I believe he is stable. I do not think his single episode of chest pain represented any significant ischemia. At this point there may be a problem with Plavix. It is very important that we continue his dual antiplatelet therapy. We will try to keep him on Brilinta for least 6 months. If we cannot do this we will have to try Plavix.

## 2012-06-05 NOTE — Patient Instructions (Addendum)
Your physician recommends that you schedule a follow-up appointment in: 3 months  Your physician has recommended you make the following change in your medication: Do NOT start Plavix.  Stay on the Brilinta

## 2012-07-16 ENCOUNTER — Telehealth: Payer: Self-pay | Admitting: Cardiology

## 2012-07-16 NOTE — Telephone Encounter (Signed)
Plz return call to pt regarding brulenta Medication. [phone]

## 2012-07-16 NOTE — Telephone Encounter (Signed)
Pt was notified that samples were left at the front desk for him to pick up.

## 2012-08-07 ENCOUNTER — Inpatient Hospital Stay (HOSPITAL_COMMUNITY): Admission: RE | Admit: 2012-08-07 | Payer: Medicaid Other | Source: Ambulatory Visit

## 2012-08-07 ENCOUNTER — Ambulatory Visit (INDEPENDENT_AMBULATORY_CARE_PROVIDER_SITE_OTHER): Payer: Medicaid Other | Admitting: Nurse Practitioner

## 2012-08-07 ENCOUNTER — Encounter: Payer: Self-pay | Admitting: Nurse Practitioner

## 2012-08-07 VITALS — BP 142/92 | HR 64 | Ht 67.0 in | Wt 163.4 lb

## 2012-08-07 DIAGNOSIS — I2510 Atherosclerotic heart disease of native coronary artery without angina pectoris: Secondary | ICD-10-CM

## 2012-08-07 MED ORDER — PANTOPRAZOLE SODIUM 40 MG PO TBEC: 40.0000 mg | DELAYED_RELEASE_TABLET | Freq: Every day | ORAL | Status: DC

## 2012-08-07 MED ORDER — ISOSORBIDE MONONITRATE ER 30 MG PO TB24: 30.0000 mg | ORAL_TABLET | Freq: Two times a day (BID) | ORAL | Status: DC

## 2012-08-07 NOTE — Progress Notes (Signed)
[name] Allen Date of Birth: [birth year] Medical Record #[id number]  History of Present Illness: Mr. Crumpacker is seen back today for a work in visit. He is seen for Dr. Myrtis Ser. He is 60 years old. He has known CAD with remote CABG in 2005, prior EF 45 to 50%. Has had Evolve 2 study stent to the mid RCA this past July. EF now 55 to 65%. His other issues include depression, HLD and HTN.   He comes in today. He is here alone. His history is quite hard to extract. It looks like cardiac rehab told him to come here. He has not even started his rehab program for his stent placed in July. He has had one daily episode of chest pain since July. Does not sound like there is a pattern. He tells me it happens at rest and can happen with walking. He does not exercise. Feels like "gas". Says he will take a NTG each day. Gets relief maybe 20 minutes later. He thinks this is a different kind of pain than what he had in July but he "just doesn't know". He says he is not smoking. Says he is tired and depressed over the fact that no one will let him work. No associated symptoms such as SOB, N, V, dizziness or clamminess. No radiation.   Current Outpatient Prescriptions on File Prior to Visit  Medication Sig Dispense Refill  . aspirin 81 MG tablet Take 81 mg by mouth daily.      [name] atorvastatin (LIPITOR) 40 MG tablet Take 1 tablet (40 mg total) by mouth daily.  30 tablet  3  . diclofenac sodium (VOLTAREN) 1 % GEL Apply 1 application topically 4 (four) times daily.  100 g  5  . fish oil-omega-3 fatty acids 1000 MG capsule Take 1 g by mouth daily.      [name] lisinopril (PRINIVIL,ZESTRIL) 5 MG tablet Take 1 tablet (5 mg total) by mouth daily.  90 tablet  3  . metoprolol succinate (TOPROL-XL) 25 MG 24 hr tablet Take 0.5 tablets (12.5 mg total) by mouth daily.  90 tablet  3  . nitroGLYCERIN (NITROSTAT) 0.4 MG SL tablet Place 1 tablet (0.4 mg total) under the tongue every 5 (five) minutes as needed for chest pain.  25 tablet  11  .  Ticagrelor (BRILINTA) 90 MG TABS tablet Take 1 tablet (90 mg total) by mouth 2 (two) times daily.  32 tablet  0  . [DISCONTINUED] isosorbide mononitrate (IMDUR) 30 MG 24 hr tablet Take 0.5 tablets (15 mg total) by mouth daily.  30 tablet  11  . pantoprazole (PROTONIX) 40 MG tablet Take 1 tablet (40 mg total) by mouth daily.  30 tablet  11  . [DISCONTINUED] isosorbide mononitrate (IMDUR) 30 MG 24 hr tablet Take 1 tablet (30 mg total) by mouth daily.  30 tablet  6    No Known Allergies  Past Medical History  Diagnosis Date  . Anxiety   . Coronary artery disease 2003    status post CABG 2005  //     LHC 03/26/12: Ostial LAD 30-40%, mid LAD 20%, circumflex with minor irregularities less than 10%, mid RCA 99%, LIMA-LAD patent but with significant competitive flow from the native LAD, EF 55-65%.  PCI: Evolve 2 study stent to the mid RCA.  [name] Dysrhythmia 2003    irregular  . Depression ~ 2005  . HLD (hyperlipidemia)   . Ejection fraction     EF 55-65%, catheterization, July, 2013  .  Hypertension     Past Surgical History  Procedure Date  . Coronary angioplasty with stent placement 03/26/12    "1"  . Coronary artery bypass graft 2003    CABG X1  . Inguinal hernia repair ~ 2008    right    History  Smoking status  . Former Smoker -- 0.1 packs/day for 10 years  . Types: Cigarettes  . Quit date: 06/30/2001  Smokeless tobacco  . Never Used    History  Alcohol Use  . Yes    Comment: "drink alcohol once/yr; motorcycle ralley"    Family History  Problem Relation Age of Onset  . Heart disease Mother   . Depression Mother   . Diabetes Mother   . Early death Mother   . Hypertension Mother   . Hyperlipidemia Mother   . Kidney disease Father   . Early death Father   . Cancer Father   . Hypertension Sister     Review of Systems: The review of systems is per the HPI.  All other systems were reviewed and are negative.  Physical Exam: BP 142/92  Pulse 64  Ht 5\' 7"  (1.702 m)  Wt  163 lb 6.4 oz (74.118 kg)  BMI 25.59 kg/m2 Patient is pleasant and in no acute distress. He actually looks older than his stated age. He smells of tobacco. Skin is warm and dry. Color is normal.  HEENT is unremarkable. Normocephalic/atraumatic. PERRL. Sclera are nonicteric. Neck is supple. No masses. No JVD. Lungs are clear. Cardiac exam shows a regular rate and rhythm. Abdomen is soft. Extremities are without edema. Gait and ROM are intact. No gross neurologic deficits noted.   LABORATORY DATA: EKG today shows sinus bradycardia. No ST/T wave changes.   Lab Results  Component Value Date   WBC 10.7* 03/27/2012   HGB 12.1* 03/27/2012   HCT 35.4* 03/27/2012   PLT 348 03/27/2012   GLUCOSE 91 03/27/2012   CHOL 179 02/29/2012   TRIG 179* 02/29/2012   HDL 32* 02/29/2012   LDLCALC 111* 02/29/2012   ALT 12 02/29/2012   AST 15 02/29/2012   NA 141 03/27/2012   K 3.9 03/27/2012   CL 107 03/27/2012   CREATININE 0.80 03/27/2012   BUN 10 03/27/2012   CO2 23 03/27/2012   TSH 0.769 02/29/2012   INR 1.0 03/24/2012   HGBA1C 5.5 02/29/2012    Assessment / Plan: 1. Atypical chest pain - his history is somewhat vague. He has had one daily episode ever since his last procedure in July. I do not think the NTG actually helps if it is taking 20 minutes. He seems more depressed to me as well. I have added PPI therapy with Protonix 40 mg a day. I have also increased his Imdur to BID. He is to see Dr. Myrtis Ser in about 3 weeks. If his symptoms persist, he will need further testing.   2. HTN - blood pressure borderline.   3. Depression - this may be more of the problem.  Patient is agreeable to this plan and will call if any problems develop in the interim.

## 2012-08-07 NOTE — Patient Instructions (Signed)
We are going to add Protonix 40 mg to take one a day and increase your Imdur to 30 mg TWO times a day   We are going to see if this helps your chest pain  See Dr. Myrtis Ser as scheduled in early December  Use your NTG as needed. Use your NTG under your tongue for recurrent chest pain. May take one tablet every 5 minutes. If you are still having discomfort after 3 tablets in 15 minutes, call 911.  Call the [facility] office at [phone] if you have any questions, problems or concerns.

## 2012-08-14 ENCOUNTER — Ambulatory Visit (HOSPITAL_COMMUNITY): Payer: Medicaid Other

## 2012-08-18 ENCOUNTER — Ambulatory Visit (HOSPITAL_COMMUNITY): Payer: Medicaid Other

## 2012-08-20 ENCOUNTER — Ambulatory Visit (HOSPITAL_COMMUNITY): Payer: Medicaid Other

## 2012-08-22 ENCOUNTER — Ambulatory Visit (HOSPITAL_COMMUNITY): Payer: Medicaid Other

## 2012-08-25 ENCOUNTER — Other Ambulatory Visit: Payer: Self-pay | Admitting: Cardiology

## 2012-08-25 ENCOUNTER — Ambulatory Visit (HOSPITAL_COMMUNITY): Payer: Medicaid Other

## 2012-08-25 MED ORDER — TICAGRELOR 90 MG PO TABS: 90.0000 mg | ORAL_TABLET | Freq: Two times a day (BID) | ORAL | Status: DC

## 2012-08-26 ENCOUNTER — Other Ambulatory Visit: Payer: Self-pay | Admitting: Family Medicine

## 2012-08-27 ENCOUNTER — Ambulatory Visit (HOSPITAL_COMMUNITY): Payer: Medicaid Other

## 2012-08-29 ENCOUNTER — Ambulatory Visit (HOSPITAL_COMMUNITY): Payer: Medicaid Other

## 2012-09-01 ENCOUNTER — Ambulatory Visit (HOSPITAL_COMMUNITY): Payer: Medicaid Other

## 2012-09-03 ENCOUNTER — Ambulatory Visit (HOSPITAL_COMMUNITY): Payer: Medicaid Other

## 2012-09-04 ENCOUNTER — Ambulatory Visit: Payer: Medicaid Other | Admitting: Cardiology

## 2012-09-05 ENCOUNTER — Ambulatory Visit (HOSPITAL_COMMUNITY): Payer: Medicaid Other

## 2012-09-08 ENCOUNTER — Ambulatory Visit (HOSPITAL_COMMUNITY): Payer: Medicaid Other

## 2012-09-10 ENCOUNTER — Ambulatory Visit (HOSPITAL_COMMUNITY): Payer: Medicaid Other

## 2012-09-12 ENCOUNTER — Ambulatory Visit (HOSPITAL_COMMUNITY): Payer: Medicaid Other

## 2012-09-15 ENCOUNTER — Ambulatory Visit (HOSPITAL_COMMUNITY): Payer: Medicaid Other

## 2012-09-19 ENCOUNTER — Ambulatory Visit (HOSPITAL_COMMUNITY): Payer: Medicaid Other

## 2012-09-22 ENCOUNTER — Ambulatory Visit (HOSPITAL_COMMUNITY): Payer: Medicaid Other

## 2012-09-24 ENCOUNTER — Ambulatory Visit (HOSPITAL_COMMUNITY): Payer: Medicaid Other

## 2012-09-26 ENCOUNTER — Ambulatory Visit (HOSPITAL_COMMUNITY): Payer: Medicaid Other

## 2012-09-29 ENCOUNTER — Ambulatory Visit (HOSPITAL_COMMUNITY): Payer: Medicaid Other

## 2012-09-29 ENCOUNTER — Telehealth: Payer: Self-pay | Admitting: Family Medicine

## 2012-09-29 NOTE — Telephone Encounter (Signed)
Message copied by DE Michel Bickers on Mon Sep 29, 2012 11:51 AM ------      Message from: Chelsea Aus      Created: Fri Sep 12, 2012 12:35 PM      Regarding: Cardiac Rehab Referral             Just to give you an update on where we are with the cardiac rehab referral. Pt was contacted regarding the exercise program.  Pt reported that he was experiencing daily angina.  Appointment made for pt to see Norma Fredrickson with [facility].  Pt seen in office on 11/4.  Medication changes made.  Pt re contacted for an appointment.  Pt wanted to wait until he saw Dr. Myrtis Ser on 12/12.  It appears that the appointment was rescheduled for 10/02/12 (unknown reason).  Medicaid will pay for CR within a 6 month time frame.  With the delayed start pt will fall outside the reimbursement guidelines. Pt stent placement was 03/26/12.  If pt is assessed and is ok to proceed with CR per Dr. Myrtis Ser, pt will have to complete financial assistance through the [employer] fund in order to attend CR.      I will keep you posted.            Karlene Lineman RN

## 2012-10-01 ENCOUNTER — Ambulatory Visit (HOSPITAL_COMMUNITY): Payer: Medicaid Other

## 2012-10-02 ENCOUNTER — Ambulatory Visit (INDEPENDENT_AMBULATORY_CARE_PROVIDER_SITE_OTHER): Payer: Medicaid Other | Admitting: Cardiology

## 2012-10-02 ENCOUNTER — Encounter: Payer: Self-pay | Admitting: Cardiology

## 2012-10-02 ENCOUNTER — Telehealth: Payer: Self-pay | Admitting: Cardiology

## 2012-10-02 VITALS — BP 114/78 | HR 76 | Ht 67.0 in | Wt 169.1 lb

## 2012-10-02 DIAGNOSIS — I2510 Atherosclerotic heart disease of native coronary artery without angina pectoris: Secondary | ICD-10-CM

## 2012-10-02 DIAGNOSIS — I10 Essential (primary) hypertension: Secondary | ICD-10-CM

## 2012-10-02 NOTE — Assessment & Plan Note (Addendum)
His symptoms are very difficult to assess. He has significant disease. His EKG reveals no change. I feel we should proceed with a nuclear scan. If he has significant ischemia he only repeat catheterization because of his symptoms. Otherwise we will continue to follow him.

## 2012-10-02 NOTE — Telephone Encounter (Signed)
New problem:   Samples of Brilinta  90 mg.

## 2012-10-02 NOTE — Progress Notes (Signed)
HPI  Patient is seen for followup of coronary disease. I saw him last September, 2013. He underwent CABG in the past. He received drug-eluting stents to the mid right coronary artery in July, 2013. He remains on dual antiplatelet therapy. He did not feel well when we tried to switch him back to Plavix. He does tolerate Brilinta. He was seen in the office November, 2013 by Norma Fredrickson. He did mention chest pain at that time. His history was quite difficult to take. At that time he said that his pain improved 20 minutes after the nitroglycerin. She tried giving him a PPI and also trying low-dose Imdur. He says now that he has pain once a day. He can be at rest or with stress. He says he takes nitroglycerin that helps within a few minutes.  No Known Allergies  Current Outpatient Prescriptions  Medication Sig Dispense Refill  . aspirin 81 MG tablet Take 81 mg by mouth daily.      [name] atorvastatin (LIPITOR) 40 MG tablet TAKE 1 TABLET (40 MG TOTAL) BY MOUTH DAILY.  30 tablet  3  . diclofenac sodium (VOLTAREN) 1 % GEL Apply 1 application topically 4 (four) times daily.  100 g  5  . fish oil-omega-3 fatty acids 1000 MG capsule Take 1 g by mouth daily.      . isosorbide mononitrate (IMDUR) 30 MG 24 hr tablet Take 1 tablet (30 mg total) by mouth 2 (two) times daily.  60 tablet  11  . lisinopril (PRINIVIL,ZESTRIL) 5 MG tablet Take 1 tablet (5 mg total) by mouth daily.  90 tablet  3  . metoprolol succinate (TOPROL-XL) 25 MG 24 hr tablet Take 0.5 tablets (12.5 mg total) by mouth daily.  90 tablet  3  . nitroGLYCERIN (NITROSTAT) 0.4 MG SL tablet Place 1 tablet (0.4 mg total) under the tongue every 5 (five) minutes as needed for chest pain.  25 tablet  11  . pantoprazole (PROTONIX) 40 MG tablet Take 1 tablet (40 mg total) by mouth daily.  30 tablet  11  . Ticagrelor (BRILINTA) 90 MG TABS tablet Take 1 tablet (90 mg total) by mouth 2 (two) times daily.  64 tablet  0    History   Social History  . Marital  Status: Legally Separated    Spouse Name: N/A    Number of Children: N/A  . Years of Education: N/A   Occupational History  . Not on file.   Social History Main Topics  . Smoking status: Former Smoker -- 0.1 packs/day for 10 years    Types: Cigarettes    Quit date: 06/30/2001  . Smokeless tobacco: Never Used  . Alcohol Use: Yes     Comment: "drink alcohol once/yr; motorcycle ralley"  . Drug Use: Yes    Special: Marijuana     Comment: "tried marijuana one time in 1970; couldn't even talk"  . Sexually Active: Not Currently   Other Topics Concern  . Not on file   Social History Narrative  . No narrative on file    Family History  Problem Relation Age of Onset  . Heart disease Mother   . Depression Mother   . Diabetes Mother   . Early death Mother   . Hypertension Mother   . Hyperlipidemia Mother   . Kidney disease Father   . Early death Father   . Cancer Father   . Hypertension Sister     Past Medical History  Diagnosis Date  . Anxiety   .  Coronary artery disease 2003    status post CABG 2005  //     LHC 03/26/12: Ostial LAD 30-40%, mid LAD 20%, circumflex with minor irregularities less than 10%, mid RCA 99%, LIMA-LAD patent but with significant competitive flow from the native LAD, EF 55-65%.  PCI: Evolve 2 study stent to the mid RCA.  [name] Dysrhythmia 2003    irregular  . Depression ~ 2005  . HLD (hyperlipidemia)   . Ejection fraction     EF 55-65%, catheterization, July, 2013  . Hypertension     Past Surgical History  Procedure Date  . Coronary angioplasty with stent placement 03/26/12    "1"  . Coronary artery bypass graft 2003    CABG X1  . Inguinal hernia repair ~ 2008    right    Patient Active Problem List  Diagnosis  . COLONIC POLYPS  . HYPERCHOLESTEROLEMIA  . INGUINAL HERNIA, RIGHT  . CHEST PAIN, ATYPICAL, HX OF  . S/P CABG (coronary artery bypass graft)  . Thoracic back pain  . Right flank pain  . Ejection fraction  . Coronary artery disease   . Anxiety  . Hypertension    ROS   Patient denies fever, chills, headache, sweats, rash, change in vision, change in hearing,, cough, nausea vomiting, urinary symptoms.All other systems are reviewed and are negative.  PHYSICAL EXAM  Patient is stable today. He is oriented to person time and place. Affect is normal. There is no jugular venous distention. Lungs are clear. Respiratory effort is nonlabored. Cardiac exam reveals S1 and S2. There no clicks or significant murmurs. The abdomen is soft. There is no peripheral edema.  Filed Vitals:   10/02/12 0945  BP: 114/78  Pulse: 76  Height: 5\' 7"  (1.702 m)  Weight: 169 lb 1.9 oz (76.712 kg)  SpO2: 97%   EKG is done today and reviewed by me. There is no significant change. EKG is normal.  ASSESSMENT & PLAN

## 2012-10-02 NOTE — Assessment & Plan Note (Signed)
Blood pressure is controlled. No change in therapy. 

## 2012-10-02 NOTE — Telephone Encounter (Signed)
Samples were given when he was here for his appt with Dr Myrtis Ser.

## 2012-10-02 NOTE — Patient Instructions (Addendum)
Your physician has requested that you have a lexiscan myoview. For further information please visit [email or web address] Please follow instruction sheet, as given.  Your physician wants you to follow-up in: February.   You will receive a reminder letter in the mail two months in advance. If you don't receive a letter, please call our office to schedule the follow-up appointment.  Your physician recommends that you continue on your current medications as directed. Please refer to the Current Medication list given to you today.

## 2012-10-03 ENCOUNTER — Ambulatory Visit (HOSPITAL_COMMUNITY): Payer: Medicaid Other

## 2012-10-06 ENCOUNTER — Ambulatory Visit (HOSPITAL_COMMUNITY): Payer: Medicaid Other

## 2012-10-08 ENCOUNTER — Encounter: Payer: Self-pay | Admitting: Cardiology

## 2012-10-08 ENCOUNTER — Ambulatory Visit (HOSPITAL_COMMUNITY): Payer: Medicaid Other

## 2012-10-09 ENCOUNTER — Encounter (HOSPITAL_COMMUNITY): Payer: Medicaid Other

## 2012-10-10 ENCOUNTER — Ambulatory Visit (HOSPITAL_COMMUNITY): Payer: Medicaid Other

## 2012-10-13 ENCOUNTER — Ambulatory Visit (HOSPITAL_COMMUNITY): Payer: Medicaid Other

## 2012-10-15 ENCOUNTER — Ambulatory Visit (HOSPITAL_COMMUNITY): Payer: Medicaid Other

## 2012-10-15 ENCOUNTER — Encounter (HOSPITAL_COMMUNITY): Payer: Medicaid Other

## 2012-10-17 ENCOUNTER — Ambulatory Visit (HOSPITAL_COMMUNITY): Payer: Medicaid Other

## 2012-10-20 ENCOUNTER — Ambulatory Visit (HOSPITAL_COMMUNITY): Payer: Medicaid Other

## 2012-10-22 ENCOUNTER — Ambulatory Visit (HOSPITAL_COMMUNITY): Payer: Medicaid Other

## 2012-10-24 ENCOUNTER — Ambulatory Visit (HOSPITAL_COMMUNITY): Payer: Medicaid Other

## 2012-10-27 ENCOUNTER — Ambulatory Visit (HOSPITAL_COMMUNITY): Payer: Medicaid Other

## 2012-10-29 ENCOUNTER — Encounter (HOSPITAL_COMMUNITY): Payer: Medicaid Other

## 2012-10-29 ENCOUNTER — Ambulatory Visit (HOSPITAL_COMMUNITY): Payer: Medicaid Other

## 2012-10-31 ENCOUNTER — Ambulatory Visit (HOSPITAL_COMMUNITY): Payer: Medicaid Other

## 2012-11-03 ENCOUNTER — Ambulatory Visit (HOSPITAL_COMMUNITY): Payer: Medicaid Other

## 2012-11-05 ENCOUNTER — Ambulatory Visit (HOSPITAL_COMMUNITY): Payer: Medicaid Other

## 2012-11-05 ENCOUNTER — Ambulatory Visit: Payer: Medicaid Other | Admitting: Family Medicine

## 2012-11-07 ENCOUNTER — Ambulatory Visit (HOSPITAL_COMMUNITY): Payer: Medicaid Other

## 2012-11-10 ENCOUNTER — Ambulatory Visit (HOSPITAL_COMMUNITY): Payer: Medicaid Other

## 2012-11-12 ENCOUNTER — Encounter: Payer: Self-pay | Admitting: Cardiology

## 2012-11-12 ENCOUNTER — Ambulatory Visit (HOSPITAL_COMMUNITY): Payer: Medicaid Other

## 2012-11-12 ENCOUNTER — Ambulatory Visit (HOSPITAL_COMMUNITY): Payer: Medicaid Other | Attending: Cardiology | Admitting: Radiology

## 2012-11-12 VITALS — BP 105/68 | Ht 67.0 in | Wt 162.0 lb

## 2012-11-12 DIAGNOSIS — R0789 Other chest pain: Secondary | ICD-10-CM | POA: Insufficient documentation

## 2012-11-12 DIAGNOSIS — E785 Hyperlipidemia, unspecified: Secondary | ICD-10-CM | POA: Insufficient documentation

## 2012-11-12 DIAGNOSIS — I10 Essential (primary) hypertension: Secondary | ICD-10-CM | POA: Insufficient documentation

## 2012-11-12 DIAGNOSIS — R5381 Other malaise: Secondary | ICD-10-CM | POA: Insufficient documentation

## 2012-11-12 DIAGNOSIS — R0989 Other specified symptoms and signs involving the circulatory and respiratory systems: Secondary | ICD-10-CM | POA: Insufficient documentation

## 2012-11-12 DIAGNOSIS — R42 Dizziness and giddiness: Secondary | ICD-10-CM | POA: Insufficient documentation

## 2012-11-12 DIAGNOSIS — Z951 Presence of aortocoronary bypass graft: Secondary | ICD-10-CM | POA: Insufficient documentation

## 2012-11-12 DIAGNOSIS — R61 Generalized hyperhidrosis: Secondary | ICD-10-CM | POA: Insufficient documentation

## 2012-11-12 DIAGNOSIS — R0609 Other forms of dyspnea: Secondary | ICD-10-CM | POA: Insufficient documentation

## 2012-11-12 MED ORDER — TECHNETIUM TC 99M SESTAMIBI GENERIC - CARDIOLITE
11.0000 | Freq: Once | INTRAVENOUS | Status: AC | PRN
  Administered 2012-11-12: 11 via INTRAVENOUS

## 2012-11-12 NOTE — Progress Notes (Signed)
   I saw the patient in the office in January, 2014. He was having some chest discomfort. Decision was made to proceed with a nuclear stress study. He came in for this study today but then decided he would not be able to stay long enough to have the complete study. Therefore the second part of the study has been rescheduled for another day. This day will be after the day that we have arranged for him to be seen back in the office in followup. At office visit will no longer be necessary as I need the information about his nuclear scan. The plan will be for him to have his nuclear scan completed and then we will be in touch with the patient with information. We will move his followup visit to a later date.  Jerral Bonito, MD

## 2012-11-14 ENCOUNTER — Ambulatory Visit: Payer: Medicaid Other | Admitting: Cardiology

## 2012-11-14 ENCOUNTER — Ambulatory Visit (HOSPITAL_COMMUNITY): Payer: Medicaid Other

## 2012-11-17 ENCOUNTER — Ambulatory Visit (HOSPITAL_COMMUNITY): Payer: Medicaid Other

## 2012-11-18 ENCOUNTER — Ambulatory Visit (HOSPITAL_COMMUNITY): Payer: Medicaid Other | Attending: Cardiology | Admitting: Radiology

## 2012-11-18 DIAGNOSIS — I2510 Atherosclerotic heart disease of native coronary artery without angina pectoris: Secondary | ICD-10-CM

## 2012-11-18 MED ORDER — REGADENOSON 0.4 MG/5ML IV SOLN
0.4000 mg | Freq: Once | INTRAVENOUS | Status: AC
  Administered 2012-11-18: 0.4 mg via INTRAVENOUS

## 2012-11-18 MED ORDER — TECHNETIUM TC 99M SESTAMIBI GENERIC - CARDIOLITE
30.0000 | Freq: Once | INTRAVENOUS | Status: AC | PRN
  Administered 2012-11-18: 30 via INTRAVENOUS

## 2012-11-18 NOTE — Progress Notes (Signed)
[facility] SITE 3 NUCLEAR MED [address] [place], [place] [address] [phone]    Cardiology Nuclear Med Study  [name] Allen is a 62 y.o. male     MRN : [id number]     DOB: 1951/05/27  Procedure Date: 11/18/2012  Nuclear Med Background Indication for Stress Test:  Evaluation for Ischemia, Graft Patency and Stent Patency History:  '05 CABG x1, 07 MPS: EF: 54% (-) ischemia, 07/13 EF: 55-65% N/O LAD LAD 99% patent stent: RCA and graft Cardiac Risk Factors: Family History - CAD, History of Smoking, Hypertension and Lipids  Symptoms:  Chest Pain, Diaphoresis, Dizziness, DOE, Fatigue, Fatigue with Exertion and Light-Headedness   Nuclear Pre-Procedure Caffeine/Decaff Intake:  None NPO After: 9:00pm   Lungs:  clear O2 Sat: 95% on room air. IV 0.9% NS with Angio Cath:  20g  IV Site: R Antecubital  IV Started by:  Milana Na, EMT-P  Chest Size (in):  42 Cup Size: n/a  Height: 5\' 7"  (1.702 m)  Weight:  162 lb (73.483 kg)  BMI:  Body mass index is 25.37 kg/(m^2). Tech Comments:  No Rx this am    Nuclear Med Study 1 or 2 day study: 2 day  Stress Test Type:  Eugenie Birks  Reading MD: Olga Millers, MD  Order Authorizing Provider:  J.Katz MD  Resting Radionuclide: Technetium 108m Sestamibi  Resting Radionuclide Dose: 11.0 mCi   Stress Radionuclide:  Technetium 2m Sestamibi  Stress Radionuclide Dose: 33.0 mCi           Stress Protocol Rest HR: 60 Stress HR: 97  Rest BP: 105/68 Stress BP: 130/95  Exercise Time (min): n/a METS: n/a   Predicted Max HR: 158 bpm % Max HR: 61.39 bpm Rate Pressure Product: 46962   Dose of Adenosine (mg):  n/a Dose of Lexiscan: 0.4 mg  Dose of Atropine (mg): n/a Dose of Dobutamine: n/a mcg/kg/min (at max HR)  Stress Test Technologist: Milana Na, EMT-P  Nuclear Technologist:  Domenic Polite, CNMT     Rest Procedure:  Myocardial perfusion imaging was performed at rest 45 minutes following the intravenous administration of  Technetium 29m Sestamibi. Rest ECG: NSR - Normal EKG  Stress Procedure:  The patient received IV Lexiscan 0.4 mg over 15-seconds.  Technetium 39m Sestamibi injected at 30-seconds.  The patient had sob and fullness in his chest with Lexiscan. Quantitative spect images were obtained after a 45 minute delay. Stress ECG: No significant ST segment change suggestive of ischemia.  QPS Raw Data Images:  Acquisition technically good; normal left ventricular size. Stress Images:  Normal homogeneous uptake in all areas of the myocardium. Rest Images:  Normal homogeneous uptake in all areas of the myocardium. Subtraction (SDS):  No evidence of ischemia. Transient Ischemic Dilatation (Normal <1.22):  0.94 Lung/Heart Ratio (Normal <0.45):  0.36  Quantitative Gated Spect Images QGS EDV:  95 ml QGS ESV:  40 ml  Impression Exercise Capacity:  Lexiscan with no exercise. BP Response:  Normal blood pressure response. Clinical Symptoms:  There is chest tightness and dyspnea. ECG Impression:  No significant ST segment change suggestive of ischemia. Comparison with Prior Nuclear Study: No images to compare  Overall Impression:  Normal stress nuclear study.  LV Ejection Fraction: 58%.  LV Wall Motion:  NL LV Function; NL Wall Motion   Olga Millers

## 2012-11-19 ENCOUNTER — Ambulatory Visit (HOSPITAL_COMMUNITY): Payer: Medicaid Other

## 2012-11-21 ENCOUNTER — Ambulatory Visit (HOSPITAL_COMMUNITY): Payer: Medicaid Other

## 2012-12-22 ENCOUNTER — Ambulatory Visit: Payer: Medicaid Other | Admitting: Cardiology

## 2012-12-26 ENCOUNTER — Encounter: Payer: Self-pay | Admitting: Family Medicine

## 2012-12-26 ENCOUNTER — Ambulatory Visit (INDEPENDENT_AMBULATORY_CARE_PROVIDER_SITE_OTHER): Payer: Medicaid Other | Admitting: Family Medicine

## 2012-12-26 VITALS — BP 133/76 | HR 56 | Temp 98.7°F | Ht 67.0 in | Wt 163.5 lb

## 2012-12-26 DIAGNOSIS — M546 Pain in thoracic spine: Secondary | ICD-10-CM

## 2012-12-26 DIAGNOSIS — M549 Dorsalgia, unspecified: Secondary | ICD-10-CM

## 2012-12-26 DIAGNOSIS — G8929 Other chronic pain: Secondary | ICD-10-CM

## 2012-12-26 MED ORDER — CYCLOBENZAPRINE HCL 5 MG PO TABS: 5.0000 mg | ORAL_TABLET | Freq: Every day | ORAL | Status: DC

## 2012-12-26 NOTE — Patient Instructions (Signed)
It was good to see you today, [name] Allen. Take Flexeril 5 mg once daily at bedtime for 4 weeks. I will also refer you to Physical Therapy. If pain does not improve in 4 weeks, please schedule appointment with me. If you develop worsening back pain, blood in your urine, please call your doctor. If you develop chest pain, difficulty breathing, or lose control of your urine or stools, please report to ER.

## 2012-12-26 NOTE — Progress Notes (Signed)
  Subjective:    Patient ID: [name] Allen, male    DOB: 1951/01/09, 62 y.o.   MRN: [id number]  HPI  Same day appointment for low back pain: Back pain has been present for about 8 years. Located RT side of low back and does not radiate. Describes pain as sharp, intermittent. Today, pain scale 8/10. Denies any urinary retention or bowel incontinence. Denies any numbness or tingling of lower extremities. Denies any dysuria, penile discharge, fevers, nausea/vomiting. He has Voltaren at home, but this does not help.  Review of Systems  Per HPI    Objective:   Physical Exam  Constitutional: No distress.  Cardiovascular: Normal rate.   Pulmonary/Chest: Effort normal.  Musculoskeletal:  Back: Limited flexion and extension due to poor effort from patient No obvious bony abnormalities of thoracic or lumbar spine. Skin is not warm or erythematous. Skin is very tender on palpation, patient jumps with palpation of back diffusely. Normal gait.      Assessment & Plan:

## 2012-12-28 ENCOUNTER — Encounter: Payer: Self-pay | Admitting: Family Medicine

## 2012-12-28 NOTE — Assessment & Plan Note (Signed)
Unsure of etiology.  Thoracic X-ray was unremarkable.  UA did not reveal UTI or hematuria.  Patient's upper back is very tender to touch, but skin is normal without signs of infection or abscess.  Will treat with Flexeril 5 mg at bedtime as needed and refer to Physical Therapy.  May consider Lyrica if pain does not improve.  Red flags reviewed with patient.

## 2013-01-25 ENCOUNTER — Emergency Department (HOSPITAL_COMMUNITY)
Admission: EM | Admit: 2013-01-25 | Discharge: 2013-01-26 | Disposition: A | Discharge status: Home / Self Care | Payer: Medicaid Other | Attending: Emergency Medicine | Admitting: Emergency Medicine

## 2013-01-25 ENCOUNTER — Encounter (HOSPITAL_COMMUNITY): Payer: Self-pay

## 2013-01-25 DIAGNOSIS — Y92410 Unspecified street and highway as the place of occurrence of the external cause: Secondary | ICD-10-CM | POA: Insufficient documentation

## 2013-01-25 DIAGNOSIS — Z87891 Personal history of nicotine dependence: Secondary | ICD-10-CM | POA: Insufficient documentation

## 2013-01-25 DIAGNOSIS — S82409A Unspecified fracture of shaft of unspecified fibula, initial encounter for closed fracture: Secondary | ICD-10-CM | POA: Insufficient documentation

## 2013-01-25 DIAGNOSIS — I2510 Atherosclerotic heart disease of native coronary artery without angina pectoris: Secondary | ICD-10-CM | POA: Insufficient documentation

## 2013-01-25 DIAGNOSIS — Z7982 Long term (current) use of aspirin: Secondary | ICD-10-CM | POA: Insufficient documentation

## 2013-01-25 DIAGNOSIS — S60222A Contusion of left hand, initial encounter: Secondary | ICD-10-CM

## 2013-01-25 DIAGNOSIS — I10 Essential (primary) hypertension: Secondary | ICD-10-CM | POA: Insufficient documentation

## 2013-01-25 DIAGNOSIS — E785 Hyperlipidemia, unspecified: Secondary | ICD-10-CM | POA: Insufficient documentation

## 2013-01-25 DIAGNOSIS — Z951 Presence of aortocoronary bypass graft: Secondary | ICD-10-CM | POA: Insufficient documentation

## 2013-01-25 DIAGNOSIS — Z8659 Personal history of other mental and behavioral disorders: Secondary | ICD-10-CM | POA: Insufficient documentation

## 2013-01-25 DIAGNOSIS — S60229A Contusion of unspecified hand, initial encounter: Secondary | ICD-10-CM | POA: Insufficient documentation

## 2013-01-25 DIAGNOSIS — Z79899 Other long term (current) drug therapy: Secondary | ICD-10-CM | POA: Insufficient documentation

## 2013-01-25 DIAGNOSIS — Y9389 Activity, other specified: Secondary | ICD-10-CM | POA: Insufficient documentation

## 2013-01-25 DIAGNOSIS — S82402A Unspecified fracture of shaft of left fibula, initial encounter for closed fracture: Secondary | ICD-10-CM

## 2013-01-25 DIAGNOSIS — T07XXXA Unspecified multiple injuries, initial encounter: Secondary | ICD-10-CM

## 2013-01-25 DIAGNOSIS — Z8679 Personal history of other diseases of the circulatory system: Secondary | ICD-10-CM | POA: Insufficient documentation

## 2013-01-25 DIAGNOSIS — IMO0002 Reserved for concepts with insufficient information to code with codable children: Secondary | ICD-10-CM | POA: Insufficient documentation

## 2013-01-25 MED ORDER — OXYCODONE-ACETAMINOPHEN 5-325 MG PO TABS
1.0000 | ORAL_TABLET | Freq: Once | ORAL | Status: AC
  Administered 2013-01-25: 1 via ORAL
  Filled 2013-01-25: qty 1
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
## 2013-01-26 ENCOUNTER — Emergency Department (HOSPITAL_COMMUNITY): Payer: Medicaid Other

## 2013-01-26 ENCOUNTER — Encounter (HOSPITAL_COMMUNITY): Payer: Self-pay | Admitting: Radiology

## 2013-01-26 MED ORDER — OXYCODONE-ACETAMINOPHEN 5-325 MG PO TABS: 1.0000 | ORAL_TABLET | ORAL | Status: DC | PRN

## 2013-01-26 NOTE — ED Provider Notes (Signed)
Medical screening examination/treatment/procedure(s) were performed by non-physician practitioner and as supervising physician I was immediately available for consultation/collaboration.  Jones Skene, M.D.      Jones Skene, MD 01/26/13 [phone]

## 2013-01-26 NOTE — ED Notes (Signed)
Pt returned from XR, CT

## 2013-01-26 NOTE — ED Notes (Signed)
Wound care completed for visualization of wounds. RN cleansed wounds with wound cleanser and sterile saline.

## 2013-01-26 NOTE — ED Notes (Signed)
Pt transported to CT/XR 

## 2013-01-30 ENCOUNTER — Ambulatory Visit (INDEPENDENT_AMBULATORY_CARE_PROVIDER_SITE_OTHER): Payer: Medicaid Other | Admitting: Family Medicine

## 2013-01-30 ENCOUNTER — Encounter: Payer: Self-pay | Admitting: Family Medicine

## 2013-01-30 VITALS — BP 120/79 | HR 80 | Temp 98.7°F | Ht 67.0 in | Wt 160.1 lb

## 2013-01-30 DIAGNOSIS — IMO0002 Reserved for concepts with insufficient information to code with codable children: Secondary | ICD-10-CM

## 2013-01-30 DIAGNOSIS — S82409A Unspecified fracture of shaft of unspecified fibula, initial encounter for closed fracture: Secondary | ICD-10-CM | POA: Insufficient documentation

## 2013-01-30 DIAGNOSIS — T07XXXA Unspecified multiple injuries, initial encounter: Secondary | ICD-10-CM | POA: Insufficient documentation

## 2013-01-30 DIAGNOSIS — S82409B Unspecified fracture of shaft of unspecified fibula, initial encounter for open fracture type I or II: Secondary | ICD-10-CM

## 2013-01-30 DIAGNOSIS — S82402B Unspecified fracture of shaft of left fibula, initial encounter for open fracture type I or II: Secondary | ICD-10-CM

## 2013-01-30 DIAGNOSIS — S82402E Unspecified fracture of shaft of left fibula, subsequent encounter for open fracture type I or II with routine healing: Secondary | ICD-10-CM

## 2013-01-30 DIAGNOSIS — S8290XD Unspecified fracture of unspecified lower leg, subsequent encounter for closed fracture with routine healing: Secondary | ICD-10-CM

## 2013-01-30 MED ORDER — OXYCODONE-ACETAMINOPHEN 10-325 MG PO TABS: 1.0000 | ORAL_TABLET | Freq: Four times a day (QID) | ORAL | Status: DC | PRN

## 2013-01-30 MED ORDER — SILVER SULFADIAZINE 1 % EX CREA: TOPICAL_CREAM | Freq: Every day | CUTANEOUS | Status: DC
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
## 2013-02-12 ENCOUNTER — Telehealth: Payer: Self-pay | Admitting: Family Medicine

## 2013-02-12 MED ORDER — OXYCODONE-ACETAMINOPHEN 10-325 MG PO TABS: 1.0000 | ORAL_TABLET | Freq: Four times a day (QID) | ORAL | Status: DC | PRN

## 2013-02-12 NOTE — Telephone Encounter (Signed)
Patient is going out of town today and would like a refill on Percocet given quantity of #30(thirty) on 01/30/2013 by Dr. Tye Savoy.  Will fwd this message to Dr. Madolyn Frieze who is covering for Dr. Tye Savoy for approval or denial.  Pt instructed that he would have to pick up the prescription if it is approved.  Vonya Ohalloran, Darlyne Russian, CMA

## 2013-02-12 NOTE — Telephone Encounter (Signed)
Pt needs prescription sent to pharmacy prescription is Percosents.

## 2013-02-12 NOTE — Telephone Encounter (Signed)
Please notify patient Rx for 15 tablets placed in front office for pick-up

## 2013-02-17 ENCOUNTER — Ambulatory Visit: Payer: Medicaid Other | Admitting: Family Medicine

## 2013-04-14 ENCOUNTER — Encounter: Payer: Self-pay | Admitting: Family Medicine

## 2013-04-14 ENCOUNTER — Ambulatory Visit (INDEPENDENT_AMBULATORY_CARE_PROVIDER_SITE_OTHER): Payer: Medicaid Other | Admitting: Family Medicine

## 2013-04-14 ENCOUNTER — Other Ambulatory Visit: Payer: Self-pay | Admitting: Family Medicine

## 2013-04-14 VITALS — BP 143/81 | HR 60 | Ht 67.0 in | Wt 162.0 lb

## 2013-04-14 DIAGNOSIS — M546 Pain in thoracic spine: Secondary | ICD-10-CM

## 2013-04-14 DIAGNOSIS — M549 Dorsalgia, unspecified: Secondary | ICD-10-CM

## 2013-04-14 MED ORDER — CYCLOBENZAPRINE HCL 5 MG PO TABS: 5.0000 mg | ORAL_TABLET | Freq: Two times a day (BID) | ORAL | Status: DC | PRN

## 2013-04-14 MED ORDER — PREGABALIN 75 MG PO CAPS: 75.0000 mg | ORAL_CAPSULE | Freq: Two times a day (BID) | ORAL | Status: DC

## 2013-04-14 NOTE — Patient Instructions (Signed)
Nice to meet you.   I want to increase your dosage of Flexeril to two times per day and Lyrica twice a day. We'll try this for a month or two and see if you pain is any better. Follow up with me if pain persists or the medications do not work.   Please take the ibuprofen with food or try to avoid taking it if you can.   Thank you and have a nice day.

## 2013-04-14 NOTE — Telephone Encounter (Signed)
Pt is stating that the pharmacy only received one of his prescriptions today. The Lyrica was not received. JW
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
## 2013-04-14 NOTE — Progress Notes (Signed)
Patient ID: [name] Allen, male   DOB: 1951/06/14, 62 y.o.   MRN: [id number] [facility] Clare Gandy, MD Phone: [phone]  Subjective:  Thoracic Back pain:  Pain has been ongoing for over a year. 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 The pain is a 9/10, constant with radiation across his thoracic dorsal back. There is no radiation up or down. The pain is sharp and is there throughout the day. Ice or heat doesn't help. He take tylenol x2 and ibuprofen x2 daily and it seems to help. He has tried voltaren gel with no alleviation. The flexeril he is taking now doesn't seem to help. The back pain is not alleviated by lying down. It is worse when he is getting down out of a truck of going up a step.   ROS--See HPI  Past Medical History Reviewed problem list.  Medications- reviewed and updated Chief complaint-noted  Objective: BP 143/81  Pulse 60  Ht 5\' 7"  (1.702 m)  Wt 73.483 kg (162 lb)  BMI 25.37 kg/m2 Gen: NAD, Appears stiff on exam, uses cane to help walk, seems to be in pain. Cam boot on left foot.  CV: RRR no murmurs rubs or gallops Lungs: CTAB no crackles, wheeze, rhonchi Skin: warm, dry, no rash  MSK: zero degrees of flexion or extension of back, not able to side twist, no able to lean from side to side, tender to palpation on thoracic dorsal back. Muscle spasm palpated.  Neuro: grossly normal, moves all extremities  Assessment/Plan:

## 2013-04-15 NOTE — Telephone Encounter (Signed)
Please contact [name] Allen to come pick up his Lyrica prescription from the front desk. I cannot e-prescribe it.

## 2013-04-16 ENCOUNTER — Telehealth: Payer: Self-pay | Admitting: Family Medicine

## 2013-04-16 DIAGNOSIS — M549 Dorsalgia, unspecified: Secondary | ICD-10-CM

## 2013-04-16 MED ORDER — PREGABALIN 75 MG PO CAPS: 75.0000 mg | ORAL_CAPSULE | Freq: Two times a day (BID) | ORAL | Status: DC

## 2013-04-16 NOTE — Telephone Encounter (Signed)
Spoke with patient and informed him that rx is up front for pick up 

## 2013-04-16 NOTE — Telephone Encounter (Signed)
Pt was told that he will also taking Lyrica, but the prescription was never sent to his pharmacy. He would like [place] to send it to his pharmacy on file. JW

## 2013-04-16 NOTE — Telephone Encounter (Signed)
Left message on voice mail that prescription is available for pick up at front desk per MD.  Radene Ou, CMA

## 2013-04-23 NOTE — Telephone Encounter (Signed)
error 

## 2013-10-21 ENCOUNTER — Ambulatory Visit: Payer: No Typology Code available for payment source

## 2013-11-02 ENCOUNTER — Encounter: Payer: Self-pay | Admitting: Family Medicine

## 2013-11-02 ENCOUNTER — Ambulatory Visit (INDEPENDENT_AMBULATORY_CARE_PROVIDER_SITE_OTHER): Payer: No Typology Code available for payment source | Admitting: Family Medicine

## 2013-11-02 VITALS — BP 122/73 | HR 83 | Temp 98.4°F | Resp 18 | Wt 160.0 lb

## 2013-11-02 DIAGNOSIS — Z951 Presence of aortocoronary bypass graft: Secondary | ICD-10-CM

## 2013-11-02 DIAGNOSIS — Z1211 Encounter for screening for malignant neoplasm of colon: Secondary | ICD-10-CM

## 2013-11-02 DIAGNOSIS — Z9189 Other specified personal risk factors, not elsewhere classified: Secondary | ICD-10-CM

## 2013-11-02 DIAGNOSIS — Z23 Encounter for immunization: Secondary | ICD-10-CM

## 2013-11-02 DIAGNOSIS — I2510 Atherosclerotic heart disease of native coronary artery without angina pectoris: Secondary | ICD-10-CM

## 2013-11-02 MED ORDER — LISINOPRIL 5 MG PO TABS: 5.0000 mg | ORAL_TABLET | Freq: Every day | ORAL | Status: DC

## 2013-11-02 MED ORDER — NITROGLYCERIN 0.4 MG SL SUBL: 0.4000 mg | SUBLINGUAL_TABLET | SUBLINGUAL | Status: DC | PRN

## 2013-11-02 MED ORDER — METOPROLOL SUCCINATE ER 25 MG PO TB24: 12.5000 mg | ORAL_TABLET | Freq: Every day | ORAL | Status: DC

## 2013-11-02 MED ORDER — ISOSORBIDE MONONITRATE ER 30 MG PO TB24: 30.0000 mg | ORAL_TABLET | Freq: Two times a day (BID) | ORAL | Status: DC

## 2013-11-02 NOTE — Assessment & Plan Note (Signed)
Reports chest pain is unchanged. Has not had been followed up with Cardiology since Feb 2014.  - Referral to Cardiology  - refilled chronic medications.

## 2013-11-02 NOTE — Progress Notes (Signed)
Subjective:     Patient ID: [name] Allen, male   DOB: 26-Jan-1951, 63 y.o.   MRN: [id number]  HPI [name] Allen is here for a dental referral.  He has not been to a dentist in 2-3 years. He reports no fevers or chills. He reports no trouble swallowing. He hasn't had any swelling in his mouth.   He reports having chest pain. He has a history of atypical chest pain, CAD, and s/p CABG 2005.  He was last seen by his Cardiologist in Feb. 2014. He reports not being seen since then due to a lapse in his insurance.  He had a stress test done on 11/12/12 that was normal.  He had chest pain 2-3 times in the past week. He reports that it is unchanged from previously. He reports no radiation to his extremities, diaphoresis or nausea or vomiting. He is not concerned with with his chest pain.    Current Outpatient Prescriptions on File Prior to Visit  Medication Sig Dispense Refill  . aspirin 81 MG tablet Take 81 mg by mouth daily.      [name] atorvastatin (LIPITOR) 40 MG tablet Take 40 mg by mouth daily.      . cyclobenzaprine (FLEXERIL) 5 MG tablet Take 1 tablet (5 mg total) by mouth 2 (two) times daily as needed for muscle spasms.  90 tablet  0  . diclofenac sodium (VOLTAREN) 1 % GEL Apply 1 application topically 4 (four) times daily.  100 g  5  . fish oil-omega-3 fatty acids 1000 MG capsule Take 1 g by mouth daily.      . isosorbide mononitrate (IMDUR) 30 MG 24 hr tablet Take 1 tablet (30 mg total) by mouth 2 (two) times daily.  60 tablet  11  . lisinopril (PRINIVIL,ZESTRIL) 5 MG tablet Take 1 tablet (5 mg total) by mouth daily.  90 tablet  3  . metoprolol succinate (TOPROL-XL) 25 MG 24 hr tablet Take 0.5 tablets (12.5 mg total) by mouth daily.  90 tablet  3  . nitroGLYCERIN (NITROSTAT) 0.4 MG SL tablet Place 1 tablet (0.4 mg total) under the tongue every 5 (five) minutes as needed for chest pain.  25 tablet  11  . oxyCODONE-acetaminophen (PERCOCET) 10-325 MG per tablet Take 1 tablet by mouth every 6 (six)  hours as needed for pain.  15 tablet  0  . pantoprazole (PROTONIX) 40 MG tablet Take 1 tablet (40 mg total) by mouth daily.  30 tablet  11  . pregabalin (LYRICA) 75 MG capsule Take 1 capsule (75 mg total) by mouth 2 (two) times daily.  90 capsule  1  . silver sulfADIAZINE (SILVADENE) 1 % cream Apply topically daily.  50 g  0  . Ticagrelor (BRILINTA) 90 MG TABS tablet Take 1 tablet (90 mg total) by mouth 2 (two) times daily.  64 tablet  0   No current facility-administered medications on file prior to visit.    I have reviewed and updated the following as appropriate: allergies, current medications, past family history, past medical history, past social history, past surgical history and problem list SHx: none  Health Maintenance: Has not had Colonoscopy since 2001. Does not want to he flu vaccine.    Review of Systems All other systems reviewed and otherwise normal.      Objective:   Physical Exam BP 122/73  Pulse 83  Temp(Src) 98.4 F (36.9 C) (Oral)  Resp 18  Wt 160 lb (72.576 kg)  SpO2  96% Gen: NAD, alert, cooperative with exam, well-appearing, African American male  HEENT: NCAT, clear conjunctiva, oropharynx clear, supple neck CV: RRR, good S1/S2, no murmur, no edema, capillary refill brisk  Resp: CTABL, no wheezes, non-labored Skin: no rashes, normal turgor  Neuro: no gross deficits.  Psych: good insight, alert and oriented     Assessment:         Plan:     Health Maintenance: referred to GI for colonoscopy.

## 2013-11-02 NOTE — Patient Instructions (Signed)
Thank you for coming in,   I refilled your medications.   We are not able to make an referral to a Dentist at this time unless it is an urgent matter. You will either need to call the number on your orange card or call around town.   I made a referral to the gastroenterologist for a colonoscopy. I also made referral to the Cardiologist that you have seen in the past for a check up.    Please feel free to call with any questions or concerns at any time, at [phone]. --Dr. Raeford Razor

## 2013-11-03 ENCOUNTER — Telehealth: Payer: Self-pay | Admitting: Family Medicine

## 2013-11-03 DIAGNOSIS — K0889 Other specified disorders of teeth and supporting structures: Secondary | ICD-10-CM

## 2013-11-03 NOTE — Telephone Encounter (Signed)
Patient is recently seen for dental referral. Patient has orange card and can only be referred if need pain medication and antibiotics. He had no fever or any sign of infection. He will need to call around to see if there is any dentist that will provide him a discount.

## 2013-11-03 NOTE — Telephone Encounter (Signed)
Relayed message ,Patient would like referral to be generated so this could go to the pool for orange card/[place].He feel this should be an urgent case due to severe pain of his gums.thank you. French Kendra, Lewie Loron

## 2013-11-03 NOTE — Telephone Encounter (Signed)
[name] Allen is requesting a referral to the dental clinic.  [name] having a problem with eating.  Need a lower partial plate.  Gums are real tender,difficulty chewing due to condition.

## 2013-11-05 DIAGNOSIS — K0889 Other specified disorders of teeth and supporting structures: Secondary | ICD-10-CM | POA: Insufficient documentation

## 2013-11-05 MED ORDER — PENICILLIN V POTASSIUM 500 MG PO TABS: 500.0000 mg | ORAL_TABLET | Freq: Four times a day (QID) | ORAL | Status: DC

## 2013-11-05 NOTE — Telephone Encounter (Signed)
If patient in that amount of pain, then assume that there may be an underlying infection.  - Pen VK 500 mg QID for 14 days.  - Patient already has pain medication  - Referral to Dentistry  - Discussed with Dr. Andria Frames

## 2013-11-05 NOTE — Telephone Encounter (Signed)
Spoke with patient and informed him of below 

## 2014-06-18 ENCOUNTER — Ambulatory Visit (INDEPENDENT_AMBULATORY_CARE_PROVIDER_SITE_OTHER): Payer: Medicare Other | Admitting: Family Medicine

## 2014-06-18 ENCOUNTER — Encounter: Payer: Self-pay | Admitting: Family Medicine

## 2014-06-18 VITALS — BP 145/79 | HR 53 | Temp 98.0°F | Wt 163.0 lb

## 2014-06-18 DIAGNOSIS — M545 Low back pain, unspecified: Secondary | ICD-10-CM | POA: Insufficient documentation

## 2014-06-18 MED ORDER — CYCLOBENZAPRINE HCL 5 MG PO TABS: 5.0000 mg | ORAL_TABLET | Freq: Two times a day (BID) | ORAL | Status: DC | PRN

## 2014-06-18 NOTE — Progress Notes (Signed)
   Subjective:    Patient ID: [name] Allen, male    DOB: 1951-02-28, 63 y.o.   MRN: [id number]  Back Pain   BACK PAIN  Location: low, R>L Quality: dull ache Onset: 3 weeks Worse with: standing after being seated for some time Better with: oxycodone Radiation: no Trauma: no Best sitting/standing/leaning forward: laying down  Red Flags Fecal/urinary incontinence: no  Numbness/Weakness: no  Fever/chills/sweats: no  Night pain: yes, some when turning over  Unexplained weight loss: no  No relief with bedrest: no  h/o cancer/immunosuppression: no  IV drug use: no  PMH of osteoporosis or chronic steroid use: no      Review of Systems  Musculoskeletal: Positive for back pain.   See HPI    Objective:   Physical Exam  Nursing note and vitals reviewed. Constitutional: He appears well-developed and well-nourished. No distress.  HENT:  Head: Normocephalic and atraumatic.  Eyes: Conjunctivae are normal. Right eye exhibits no discharge. Left eye exhibits no discharge. No scleral icterus.  Cardiovascular: Normal rate.   Pulmonary/Chest: Effort normal.  Abdominal: He exhibits no distension. There is no CVA tenderness.  Musculoskeletal:       Lumbar back: He exhibits decreased range of motion, tenderness, pain and spasm. He exhibits no bony tenderness, no swelling, no edema, no deformity, no laceration and normal pulse.  Skin: He is not diaphoretic.        Assessment & Plan:

## 2014-06-18 NOTE — Progress Notes (Signed)
Placed in PCP box for completion 

## 2014-06-18 NOTE — Patient Instructions (Signed)
For your back pain I am prescribing a muscle relaxer called flexeril that you have used before. When your back starts to feel better please do the exercises I have given you to help prevent more problems.

## 2014-06-18 NOTE — Assessment & Plan Note (Addendum)
3 weeks of pain, worse when standing after prolonged sitting. No red flags. Tender. Likely muscle spasm. - flexeril - rehab exercises given - f/u with PCP in 1-2 weeks

## 2014-06-18 NOTE — Progress Notes (Signed)
Pt dropped off form to be filled out regarding disability parking placard, best number to reach for pick up [phone].

## 2014-07-14 ENCOUNTER — Encounter: Payer: Self-pay | Admitting: Cardiology

## 2014-07-19 ENCOUNTER — Encounter: Payer: Self-pay | Admitting: Cardiology

## 2014-07-19 ENCOUNTER — Ambulatory Visit (INDEPENDENT_AMBULATORY_CARE_PROVIDER_SITE_OTHER): Payer: Medicare Other | Admitting: Cardiology

## 2014-07-19 VITALS — BP 130/80 | HR 52 | Ht 67.0 in | Wt 168.0 lb

## 2014-07-19 DIAGNOSIS — E7800 Pure hypercholesterolemia, unspecified: Secondary | ICD-10-CM

## 2014-07-19 DIAGNOSIS — I25810 Atherosclerosis of coronary artery bypass graft(s) without angina pectoris: Secondary | ICD-10-CM | POA: Diagnosis not present

## 2014-07-19 DIAGNOSIS — I10 Essential (primary) hypertension: Secondary | ICD-10-CM | POA: Diagnosis not present

## 2014-07-19 NOTE — Assessment & Plan Note (Signed)
Coronary disease is stable. It is now time to change him to only aspirin. He does not feel well with Plavix. [name] been quite stable. His Brilinta will be stopped.

## 2014-07-19 NOTE — Assessment & Plan Note (Signed)
He is on guideline directed therapy. No change in therapy. 
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
## 2014-07-19 NOTE — Patient Instructions (Addendum)
**Note De-Identified Rebeka Kimble Obfuscation** Your physician has recommended you make the following change in your medication: stop taking Brilinta (continue to take your Aspirin)  Your physician wants you to follow-up in: 1 year. You will receive a reminder letter in the mail two months in advance. If you don't receive a letter, please call our office to schedule the follow-up appointment.

## 2014-08-05 ENCOUNTER — Telehealth: Payer: Self-pay | Admitting: Family Medicine

## 2014-08-05 NOTE — Telephone Encounter (Signed)
Pt called and would like to know if the doctor can call in something stringer for his back pain. He said that muscle relaxer is not doing a good job. [name] Allen

## 2014-08-06 NOTE — Telephone Encounter (Signed)
Patient calling back to ask for a stronger pain medication.  Said the muscle relaxer is not helping.

## 2014-08-10 ENCOUNTER — Ambulatory Visit (INDEPENDENT_AMBULATORY_CARE_PROVIDER_SITE_OTHER): Payer: Medicare Other | Admitting: Family Medicine

## 2014-08-10 ENCOUNTER — Encounter: Payer: Self-pay | Admitting: Family Medicine

## 2014-08-10 VITALS — BP 136/78 | HR 61 | Temp 98.3°F | Resp 18 | Wt 169.0 lb

## 2014-08-10 DIAGNOSIS — M545 Low back pain: Secondary | ICD-10-CM | POA: Diagnosis present

## 2014-08-10 MED ORDER — GABAPENTIN 100 MG PO CAPS: 100.0000 mg | ORAL_CAPSULE | Freq: Every day | ORAL | Status: DC

## 2014-08-10 NOTE — Patient Instructions (Addendum)
Thank you for coming in,   I think that physical therapy is the thing that will help long term with your back pain.   Stretching and exercise is the key to getting better.   Please do not take narcotics that are prescribed to other individuals.    Please feel free to call with any questions or concerns at any time, at [phone]. --Dr. Raeford Razor
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
## 2014-08-11 ENCOUNTER — Encounter: Payer: Self-pay | Admitting: Family Medicine
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
## 2014-08-12 NOTE — Telephone Encounter (Signed)
Pt is calling back again and would like to know when he can get something stronger for his back pain . The gabapentin is not helping at all.jw

## 2014-08-12 NOTE — Telephone Encounter (Signed)
Patient needs OV for re-evaluation per previous phone note.  Informed patient and scheduled appt for 08/13/14 with Dr. Caryl Bis at 9:45 am since Dr. Raeford Razor is not available.  Burna Forts, BSN, RN-BC

## 2014-08-13 ENCOUNTER — Encounter: Payer: Self-pay | Admitting: Family Medicine

## 2014-08-13 ENCOUNTER — Ambulatory Visit (INDEPENDENT_AMBULATORY_CARE_PROVIDER_SITE_OTHER): Payer: Medicare Other | Admitting: Family Medicine

## 2014-08-13 VITALS — BP 121/81 | HR 65 | Temp 98.1°F | Ht 67.0 in | Wt 168.4 lb

## 2014-08-13 DIAGNOSIS — K5900 Constipation, unspecified: Secondary | ICD-10-CM | POA: Diagnosis not present

## 2014-08-13 DIAGNOSIS — M545 Low back pain: Secondary | ICD-10-CM

## 2014-08-13 MED ORDER — GABAPENTIN 100 MG PO CAPS: 100.0000 mg | ORAL_CAPSULE | Freq: Three times a day (TID) | ORAL | Status: DC

## 2014-08-13 MED ORDER — POLYETHYLENE GLYCOL 3350 17 GM/SCOOP PO POWD: 17.0000 g | Freq: Every day | ORAL | Status: DC | PRN

## 2014-08-13 MED ORDER — GABAPENTIN 100 MG PO CAPS: 200.0000 mg | ORAL_CAPSULE | Freq: Three times a day (TID) | ORAL | Status: DC

## 2014-08-13 NOTE — Assessment & Plan Note (Addendum)
Patient with many year history of constipation. No abdominal pain, no weight loss. Will treat with course of miralax. Advised to stop using girlfriends father oxycodone as this could worsen this issue. Advised to follow-up with PCP for this issue if not improving.

## 2014-08-13 NOTE — Patient Instructions (Addendum)
Nice to meet you. We will increase your gabapentin to 200 mg 3 times a day.  Please take the miralax each day for the next 5 days to help with a bowel movement. If this does not improve in the next week please return to see Dr Raeford Razor.  If you develop weakness, numbness, loss of bowel or bladder function, or fever please seek medical attention.
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
## 2014-08-13 NOTE — Assessment & Plan Note (Signed)
Improved prior to acute exacerbation last night. Noted gabapentin was beneficial. No red flags on exam or history. Will increase gabapentin to 200 mg TID. He is to continue exercises that were previously provided to him. He may benefit from PT if this does not continue to improve.

## 2014-08-25 ENCOUNTER — Ambulatory Visit (INDEPENDENT_AMBULATORY_CARE_PROVIDER_SITE_OTHER): Payer: Medicare Other | Admitting: Family Medicine

## 2014-08-25 ENCOUNTER — Ambulatory Visit
Admission: RE | Admit: 2014-08-25 | Discharge: 2014-08-25 | Disposition: A | Discharge status: Home / Self Care | Payer: Medicaid Other | Source: Ambulatory Visit | Attending: Family Medicine | Admitting: Family Medicine

## 2014-08-25 ENCOUNTER — Encounter: Payer: Self-pay | Admitting: Family Medicine

## 2014-08-25 VITALS — BP 171/98 | HR 89 | Temp 98.1°F | Ht 67.0 in | Wt 169.0 lb

## 2014-08-25 DIAGNOSIS — M4186 Other forms of scoliosis, lumbar region: Secondary | ICD-10-CM | POA: Diagnosis not present

## 2014-08-25 DIAGNOSIS — M545 Low back pain, unspecified: Secondary | ICD-10-CM

## 2014-08-25 DIAGNOSIS — M79605 Pain in left leg: Secondary | ICD-10-CM

## 2014-08-25 DIAGNOSIS — M25552 Pain in left hip: Secondary | ICD-10-CM

## 2014-08-25 MED ORDER — GABAPENTIN 400 MG PO CAPS: 400.0000 mg | ORAL_CAPSULE | Freq: Three times a day (TID) | ORAL | Status: DC

## 2014-08-25 NOTE — Assessment & Plan Note (Addendum)
Increased gabapentin dose today to 400 mg start twice a day and then go to 3 times a day. Lumbar x-ray, and left hip x-ray Follow-up with PCP in 2-4 weeks
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
## 2014-08-25 NOTE — Patient Instructions (Signed)
Hip Pain Your hip is the joint between your upper legs and your lower pelvis. The bones, cartilage, tendons, and muscles of your hip joint perform a lot of work each day supporting your body weight and allowing you to move around. Hip pain can range from a minor ache to severe pain in one or both of your hips. Pain may be felt on the inside of the hip joint near the groin, or the outside near the buttocks and upper thigh. You may have swelling or stiffness as well.  HOME CARE INSTRUCTIONS   Take medicines only as directed by your health care provider.  Apply ice to the injured area:  Put ice in a plastic bag.  Place a towel between your skin and the bag.  Leave the ice on for 15-20 minutes at a time, 3-4 times a day.  Keep your leg raised (elevated) when possible to lessen swelling.  Avoid activities that cause pain.  Follow specific exercises as directed by your health care provider.  Sleep with a pillow between your legs on your most comfortable side.  Record how often you have hip pain, the location of the pain, and what it feels like. SEEK MEDICAL CARE IF:   You are unable to put weight on your leg.  Your hip is red or swollen or very tender to touch.  Your pain or swelling continues or worsens after 1 week.  You have increasing difficulty walking.  You have a fever. SEEK IMMEDIATE MEDICAL CARE IF:   You have fallen.  You have a sudden increase in pain and swelling in your hip. MAKE SURE YOU:   Understand these instructions.  Will watch your condition.  Will get help right away if you are not doing well or get worse. Document Released: 02/28/2010 Document Revised: 01/25/2014 Document Reviewed: 05/07/2013 Exit[employer] Patient Information 2015 [employer]. This information is not intended to replace advice given to you by your health care provider. Make sure you discuss any questions you have with your health care provider.  

## 2014-08-25 NOTE — Assessment & Plan Note (Signed)
Increase gabapentin dosage today. Lumbar x-ray and hip x-ray today. Follow-up PCP in 2 weeks

## 2014-08-26 ENCOUNTER — Telehealth: Payer: Self-pay | Admitting: Family Medicine

## 2014-08-26 DIAGNOSIS — M545 Low back pain: Secondary | ICD-10-CM

## 2014-08-26 DIAGNOSIS — M79605 Pain in left leg: Principal | ICD-10-CM

## 2014-08-26 MED ORDER — MELOXICAM 7.5 MG PO TABS: 7.5000 mg | ORAL_TABLET | Freq: Every day | ORAL | Status: DC

## 2014-08-26 NOTE — Telephone Encounter (Signed)
Please call pt and inform him his hip and back have no new abnormal findings. He should attempt the taper up of the gabapentin and continue the stretches of his lower back and strengthening exercises for his lower back and abdomen he was given prior. If I also called in an anti-inflammatory pill he can try to take every morning. He should  Not take additional NSAIDS with the use of this medication.  He needs to follow up with his PCP in a few weeks. Thanks.

## 2014-08-31 ENCOUNTER — Telehealth: Payer: Self-pay | Admitting: Pulmonary Disease

## 2014-08-31 NOTE — Telephone Encounter (Signed)
SN please advise if you would like to schedule an appt for this pt.  thanks

## 2014-08-31 NOTE — Telephone Encounter (Signed)
Per SN---  SN is partially retired now---doing pulmonary only issues Needs to set up with new PCP  Either Dr. Jenny Reichmann or Dr Doug Sou.  thanks

## 2014-08-31 NOTE — Telephone Encounter (Signed)
Spoke with patient-he is aware to call [facility]. He will call the office to set up with Dr Jenny Reichmann or Dr Doug Sou. Nothing more needed at this time.

## 2014-09-02 ENCOUNTER — Encounter (HOSPITAL_COMMUNITY): Payer: Self-pay | Admitting: Cardiology

## 2014-09-20 ENCOUNTER — Other Ambulatory Visit: Payer: Self-pay | Admitting: Family Medicine

## 2014-09-20 DIAGNOSIS — M545 Low back pain: Secondary | ICD-10-CM

## 2014-09-20 DIAGNOSIS — M79605 Pain in left leg: Principal | ICD-10-CM

## 2014-09-20 NOTE — Telephone Encounter (Signed)
Pt called and needs a refill on his Mobic. jw

## 2014-09-21 MED ORDER — MELOXICAM 7.5 MG PO TABS: 7.5000 mg | ORAL_TABLET | Freq: Every day | ORAL | Status: DC

## 2014-09-21 NOTE — Telephone Encounter (Signed)
Spoke with patient and informed him of the rx sent in

## 2014-10-19 ENCOUNTER — Other Ambulatory Visit: Payer: Self-pay | Admitting: Family Medicine

## 2014-11-17 ENCOUNTER — Other Ambulatory Visit: Payer: Self-pay | Admitting: Family Medicine

## 2014-11-19 ENCOUNTER — Ambulatory Visit (INDEPENDENT_AMBULATORY_CARE_PROVIDER_SITE_OTHER): Payer: Medicare Other | Admitting: Internal Medicine

## 2014-11-19 ENCOUNTER — Other Ambulatory Visit (INDEPENDENT_AMBULATORY_CARE_PROVIDER_SITE_OTHER): Payer: Medicare Other

## 2014-11-19 ENCOUNTER — Encounter: Payer: Self-pay | Admitting: Internal Medicine

## 2014-11-19 VITALS — BP 130/88 | HR 66 | Temp 98.0°F | Ht 67.0 in | Wt 171.1 lb

## 2014-11-19 DIAGNOSIS — E7800 Pure hypercholesterolemia, unspecified: Secondary | ICD-10-CM

## 2014-11-19 DIAGNOSIS — E785 Hyperlipidemia, unspecified: Secondary | ICD-10-CM

## 2014-11-19 DIAGNOSIS — I10 Essential (primary) hypertension: Secondary | ICD-10-CM

## 2014-11-19 DIAGNOSIS — Z0000 Encounter for general adult medical examination without abnormal findings: Secondary | ICD-10-CM

## 2014-11-19 DIAGNOSIS — Z23 Encounter for immunization: Secondary | ICD-10-CM

## 2014-11-19 LAB — BASIC METABOLIC PANEL
BUN: 10 mg/dL (ref 6–23)
CO2: 29 mEq/L (ref 19–32)
CREATININE: 1 mg/dL (ref 0.40–1.50)
Calcium: 9.4 mg/dL (ref 8.4–10.5)
Chloride: 104 mEq/L (ref 96–112)
GFR: 96.44 mL/min (ref >60.00)
Glucose, Bld: 88 mg/dL (ref 70–99)
POTASSIUM: 3.6 meq/L (ref 3.5–5.1)
Sodium: 137 mEq/L (ref 135–145)

## 2014-11-19 LAB — LIPID PANEL
CHOLESTEROL: 201 mg/dL — AB (ref 0–200)
HDL: 30.1 mg/dL — ABNORMAL LOW (ref >39.00)
LDL CALC: 140 mg/dL — AB (ref 0–99)
NonHDL: 170.9
Total CHOL/HDL Ratio: 7
Triglycerides: 157 mg/dL — ABNORMAL HIGH (ref 0.0–149.0)
VLDL: 31.4 mg/dL (ref 0.0–40.0)

## 2014-11-19 NOTE — Progress Notes (Signed)
Pre visit review using our clinic review tool, if applicable. No additional management support is needed unless otherwise documented below in the visit note. 

## 2014-11-19 NOTE — Progress Notes (Signed)
   Subjective:    Patient ID: [name] Allen, male    DOB: 31-Aug-1951, 64 y.o.   MRN: [id number]  HPI Here for welcome to medicare wellness exam, no new complaints.   Diet: heart healthy Physical activity: sedentary Depression/mood screen: negative Hearing: intact to whispered voice Visual acuity: grossly normal, performs annual eye exam  ADLs: capable Fall risk: none Home safety: good Cognitive evaluation: intact to orientation, naming, recall and repetition EOL planning: adv directives discussed  I have personally reviewed and have noted 1. The patient's medical and social history 2. Their use of alcohol, tobacco or illicit drugs 3. Their current medications and supplements 4. The patient's functional ability including ADL's, fall risks, home safety risks and hearing or visual impairment. 5. Diet and physical activities 6. Evidence for depression or mood disorders 7. Care team reviewed and updated  Review of Systems  Constitutional: Negative for fever, activity change, appetite change, fatigue and unexpected weight change.  HENT: Negative.   Eyes: Negative.   Respiratory: Negative for cough, chest tightness, shortness of breath and wheezing.   Cardiovascular: Negative for chest pain, palpitations and leg swelling.  Gastrointestinal: Negative for abdominal pain, diarrhea, constipation and abdominal distention.  Genitourinary: Negative.   Skin: Negative.   Neurological: Negative.   Psychiatric/Behavioral: Negative.       Objective:   Physical Exam  Constitutional: He appears well-developed and well-nourished.  HENT:  Head: Normocephalic and atraumatic.  Eyes: EOM are normal.  Neck: Normal range of motion.  Cardiovascular: Normal rate and regular rhythm.   Pulmonary/Chest: Effort normal and breath sounds normal. No respiratory distress. He has no wheezes.  Abdominal: Soft. Bowel sounds are normal. He exhibits no distension. There is no tenderness.  Musculoskeletal: He  exhibits no edema.  Neurological: Coordination normal.  Skin: Skin is warm and dry.  Psychiatric: He has a normal mood and affect.   Filed Vitals:   11/19/14 0907  BP: 130/88  Pulse: 66  Temp: 98 F (36.7 C)  TempSrc: Oral  Height: 5\' 7"  (1.702 m)  Weight: 171 lb 2 oz (77.622 kg)  SpO2: 97%      Assessment & Plan:

## 2014-11-19 NOTE — Patient Instructions (Signed)
We will check the blood work today. We will call you back with the results.   We will also give you the pneumonia shot today. You will need another one in about 1 year and then you will be done with the pneumonia shot for life.   Keep up the good work with being active.   You are likely due to have another colonoscopy and I will look through the records to make sure. It would be a good thing to get this done in the next year.   Health Maintenance A healthy lifestyle and preventative care can promote health and wellness.  Maintain regular health, dental, and eye exams.  Eat a healthy diet. Foods like vegetables, fruits, whole grains, low-fat dairy products, and lean protein foods contain the nutrients you need and are low in calories. Decrease your intake of foods high in solid fats, added sugars, and salt. Get information about a proper diet from your health care provider, if necessary.  Regular physical exercise is one of the most important things you can do for your health. Most adults should get at least 150 minutes of moderate-intensity exercise (any activity that increases your heart rate and causes you to sweat) each week. In addition, most adults need muscle-strengthening exercises on 2 or more days a week.   Maintain a healthy weight. The body mass index (BMI) is a screening tool to identify possible weight problems. It provides an estimate of body fat based on height and weight. Your health care provider can find your BMI and can help you achieve or maintain a healthy weight. For males 20 years and older:  A BMI below 18.5 is considered underweight.  A BMI of 18.5 to 24.9 is normal.  A BMI of 25 to 29.9 is considered overweight.  A BMI of 30 and above is considered obese.  Maintain normal blood lipids and cholesterol by exercising and minimizing your intake of saturated fat. Eat a balanced diet with plenty of fruits and vegetables. Blood tests for lipids and cholesterol should  begin at age 65 and be repeated every 5 years. If your lipid or cholesterol levels are high, you are over age 22, or you are at high risk for heart disease, you may need your cholesterol levels checked more frequently.Ongoing high lipid and cholesterol levels should be treated with medicines if diet and exercise are not working.  If you smoke, find out from your health care provider how to quit. If you do not use tobacco, do not start.  Lung cancer screening is recommended for adults aged 42-80 years who are at high risk for developing lung cancer because of a history of smoking. A yearly low-dose CT scan of the lungs is recommended for people who have at least a 30-pack-year history of smoking and are current smokers or have quit within the past 15 years. A pack year of smoking is smoking an average of 1 pack of cigarettes a day for 1 year (for example, a 30-pack-year history of smoking could mean smoking 1 pack a day for 30 years or 2 packs a day for 15 years). Yearly screening should continue until the smoker has stopped smoking for at least 15 years. Yearly screening should be stopped for people who develop a health problem that would prevent them from having lung cancer treatment.  If you choose to drink alcohol, do not have more than 2 drinks per day. One drink is considered to be 12 oz (360 mL) of beer, 5  oz (150 mL) of wine, or 1.5 oz (45 mL) of liquor.  Avoid the use of street drugs. Do not share needles with anyone. Ask for help if you need support or instructions about stopping the use of drugs.  High blood pressure causes heart disease and increases the risk of stroke. Blood pressure should be checked at least every 1-2 years. Ongoing high blood pressure should be treated with medicines if weight loss and exercise are not effective.  If you are 52-70 years old, ask your health care provider if you should take aspirin to prevent heart disease.  Diabetes screening involves taking a blood  sample to check your fasting blood sugar level. This should be done once every 3 years after age 46 if you are at a normal weight and without risk factors for diabetes. Testing should be considered at a younger age or be carried out more frequently if you are overweight and have at least 1 risk factor for diabetes.  Colorectal cancer can be detected and often prevented. Most routine colorectal cancer screening begins at the age of 42 and continues through age 31. However, your health care provider may recommend screening at an earlier age if you have risk factors for colon cancer. On a yearly basis, your health care provider may provide home test kits to check for hidden blood in the stool. A small camera at the end of a tube may be used to directly examine the colon (sigmoidoscopy or colonoscopy) to detect the earliest forms of colorectal cancer. Talk to your health care provider about this at age 35 when routine screening begins. A direct exam of the colon should be repeated every 5-10 years through age 54, unless early forms of precancerous polyps or small growths are found.  People who are at an increased risk for hepatitis B should be screened for this virus. You are considered at high risk for hepatitis B if:  You were born in a country where hepatitis B occurs often. Talk with your health care provider about which countries are considered high risk.  Your parents were born in a high-risk country and you have not received a shot to protect against hepatitis B (hepatitis B vaccine).  You have HIV or AIDS.  You use needles to inject street drugs.  You live with, or have sex with, someone who has hepatitis B.  You are a man who has sex with other men (MSM).  You get hemodialysis treatment.  You take certain medicines for conditions like cancer, organ transplantation, and autoimmune conditions.  Hepatitis C blood testing is recommended for all people born from [birth year] through 1966 and any  individual with known risk factors for hepatitis C.  Healthy men should no longer receive prostate-specific antigen (PSA) blood tests as part of routine cancer screening. Talk to your health care provider about prostate cancer screening.  Testicular cancer screening is not recommended for adolescents or adult males who have no symptoms. Screening includes self-exam, a health care provider exam, and other screening tests. Consult with your health care provider about any symptoms you have or any concerns you have about testicular cancer.  Practice safe sex. Use condoms and avoid high-risk sexual practices to reduce the spread of sexually transmitted infections (STIs).  You should be screened for STIs, including gonorrhea and chlamydia if:  You are sexually active and are younger than 24 years.  You are older than 24 years, and your health care provider tells you that you are at risk  for this type of infection.  Your sexual activity has changed since you were last screened, and you are at an increased risk for chlamydia or gonorrhea. Ask your health care provider if you are at risk.  If you are at risk of being infected with HIV, it is recommended that you take a prescription medicine daily to prevent HIV infection. This is called pre-exposure prophylaxis (PrEP). You are considered at risk if:  You are a man who has sex with other men (MSM).  You are a heterosexual man who is sexually active with multiple partners.  You take drugs by injection.  You are sexually active with a partner who has HIV.  Talk with your health care provider about whether you are at high risk of being infected with HIV. If you choose to begin PrEP, you should first be tested for HIV. You should then be tested every 3 months for as long as you are taking PrEP.  Use sunscreen. Apply sunscreen liberally and repeatedly throughout the day. You should seek shade when your shadow is shorter than you. Protect yourself by  wearing long sleeves, pants, a wide-brimmed hat, and sunglasses year round whenever you are outdoors.  Tell your health care provider of new moles or changes in moles, especially if there is a change in shape or color. Also, tell your health care provider if a mole is larger than the size of a pencil eraser.  A one-time screening for abdominal aortic aneurysm (AAA) and surgical repair of large AAAs by ultrasound is recommended for men aged 23-75 years who are current or former smokers.  Stay current with your vaccines (immunizations). Document Released: 03/08/2008 Document Revised: 09/15/2013 Document Reviewed: 02/05/2011 [facility] Patient Information 2015 [place], [place]. This information is not intended to replace advice given to you by your health care provider. Make sure you discuss any questions you have with your health care provider.

## 2014-11-20 ENCOUNTER — Encounter: Payer: Self-pay | Admitting: Internal Medicine

## 2014-11-20 DIAGNOSIS — Z0000 Encounter for general adult medical examination without abnormal findings: Secondary | ICD-10-CM | POA: Insufficient documentation

## 2014-11-20 DIAGNOSIS — Z0001 Encounter for general adult medical examination with abnormal findings: Secondary | ICD-10-CM | POA: Insufficient documentation

## 2014-11-20 NOTE — Assessment & Plan Note (Signed)
Not taking his statin currently. Will check lipid panel. Will his CAD history would like <100 or <70 if possible.

## 2014-11-20 NOTE — Assessment & Plan Note (Signed)
BP at goal or borderline. For now check BMP (not checked in some time) and adjust as needed. On imdur, toprol-xl, lisinopril. Room to increase most of his meds if needed. Would increase his lisinopril next if BP still borderline.

## 2014-11-20 NOTE — Assessment & Plan Note (Signed)
Last colonoscopy unknown but he is likely overdue for repeat. He thinks it was a long time ago. Will try to get those records but if cannot find will refer for screening colonoscopy at next visit. Given pneumonia 23 shot today. Due for Prevnar next year. Has not had shingles shot and due. Declines flu shot today. Tetanus shot due in 2023. He is a non-smoker and reminded him of the harms of resuming cigarettes. He does not exercise much advised him that it would be good for his health. Talked about sun safety and skin cancer prevention.

## 2014-12-18 ENCOUNTER — Other Ambulatory Visit: Payer: Self-pay | Admitting: Family Medicine

## 2014-12-29 ENCOUNTER — Other Ambulatory Visit: Payer: Self-pay | Admitting: Family Medicine

## 2015-01-26 ENCOUNTER — Other Ambulatory Visit: Payer: Self-pay | Admitting: Family Medicine

## 2015-02-04 ENCOUNTER — Other Ambulatory Visit: Payer: Self-pay | Admitting: Family Medicine

## 2015-03-10 ENCOUNTER — Ambulatory Visit: Payer: Medicare Other | Admitting: Internal Medicine

## 2015-03-25 ENCOUNTER — Telehealth: Payer: Self-pay | Admitting: Internal Medicine

## 2015-03-25 ENCOUNTER — Ambulatory Visit (INDEPENDENT_AMBULATORY_CARE_PROVIDER_SITE_OTHER): Payer: Medicare Other | Admitting: Internal Medicine

## 2015-03-25 ENCOUNTER — Encounter: Payer: Self-pay | Admitting: Internal Medicine

## 2015-03-25 VITALS — BP 142/84 | HR 58 | Temp 98.2°F | Resp 14 | Ht 67.0 in | Wt 166.0 lb

## 2015-03-25 DIAGNOSIS — B351 Tinea unguium: Secondary | ICD-10-CM | POA: Diagnosis not present

## 2015-03-25 DIAGNOSIS — N529 Male erectile dysfunction, unspecified: Secondary | ICD-10-CM | POA: Diagnosis not present

## 2015-03-25 MED ORDER — SILDENAFIL CITRATE 50 MG PO TABS: 50.0000 mg | ORAL_TABLET | Freq: Every day | ORAL | Status: DC | PRN

## 2015-03-25 MED ORDER — TERBINAFINE HCL 250 MG PO TABS: 250.0000 mg | ORAL_TABLET | Freq: Every day | ORAL | Status: DC

## 2015-03-25 NOTE — Patient Instructions (Signed)
We will send you to urology for the pump. You can try viagra in the meantime if needed.   If you have an erection lasting more than 4 hours please seek medical care as you can get permanent damage.

## 2015-03-25 NOTE — Progress Notes (Signed)
   Subjective:    Patient ID: [name] Allen, male    DOB: Feb 09, 1951, 64 y.o.   MRN: [id number]  HPI The patient is a 64 YO man coming in for erectile dysfunction and toenail fungus. ED going on for 3 months and denies other stressors during that time. Hesitant to discuss with me. Would feel more comfortable with urologist. Wants to try pump possibly. Denies it is a problem in the past.  Toenail fungus going on for several years. On his big toes. Has tried OTC creams which did not help. No pain, some color change. They are thicker than they used to be.   Review of Systems  Constitutional: Negative for fever, activity change, appetite change, fatigue and unexpected weight change.  HENT: Negative.   Eyes: Negative.   Respiratory: Negative for cough, chest tightness, shortness of breath and wheezing.   Cardiovascular: Negative for chest pain, palpitations and leg swelling.  Gastrointestinal: Negative for abdominal pain, diarrhea, constipation and abdominal distention.  Genitourinary: Negative.   Skin: Negative.   Neurological: Negative.   Psychiatric/Behavioral: Negative.       Objective:   Physical Exam  Constitutional: He appears well-developed and well-nourished.  HENT:  Head: Normocephalic and atraumatic.  Eyes: EOM are normal.  Neck: Normal range of motion.  Cardiovascular: Normal rate and regular rhythm.   Pulmonary/Chest: Effort normal and breath sounds normal. No respiratory distress. He has no wheezes.  Abdominal: Soft. Bowel sounds are normal. He exhibits no distension. There is no tenderness.  Musculoskeletal: He exhibits no edema.  Neurological: Coordination normal.  Skin: Skin is warm and dry.  Psychiatric: He has a normal mood and affect.   Filed Vitals:   03/25/15 0850  BP: 142/84  Pulse: 58  Temp: 98.2 F (36.8 C)  TempSrc: Oral  Resp: 14  Height: 5\' 7"  (1.702 m)  Weight: 166 lb (75.297 kg)  SpO2: 98%      Assessment & Plan:

## 2015-03-25 NOTE — Assessment & Plan Note (Addendum)
Referral to urology for pump evaluation. Can try viagra. No anginal symptoms and does well with exercise. Instructions for erection lasting longer than 4 hours seek care immediately.

## 2015-03-25 NOTE — Telephone Encounter (Signed)
Patient states Dr. Doug Sou was to call in a med for toe fungus.  Patient states pharmacy did not receive.  Patient uses [facility] on [address]   Patient also states that insurance will not cover viagra.  Patient would like something in place that insurance will cover.

## 2015-03-25 NOTE — Assessment & Plan Note (Signed)
Lamisil daily for 3 months.

## 2015-03-25 NOTE — Progress Notes (Signed)
Pre visit review using our clinic review tool, if applicable. No additional management support is needed unless otherwise documented below in the visit note. 

## 2015-03-25 NOTE — Telephone Encounter (Signed)
Insurance does not cover any medications for erectile dysfunction. Sent in lamisil for the toenail fungus.

## 2015-03-30 ENCOUNTER — Encounter: Payer: Self-pay | Admitting: Internal Medicine

## 2015-05-20 ENCOUNTER — Ambulatory Visit (INDEPENDENT_AMBULATORY_CARE_PROVIDER_SITE_OTHER): Payer: Medicare Other | Admitting: Internal Medicine

## 2015-05-20 ENCOUNTER — Encounter: Payer: Self-pay | Admitting: Internal Medicine

## 2015-05-20 VITALS — BP 170/108 | HR 85 | Temp 98.4°F | Resp 16 | Ht 67.0 in | Wt 169.8 lb

## 2015-05-20 DIAGNOSIS — E7800 Pure hypercholesterolemia, unspecified: Secondary | ICD-10-CM

## 2015-05-20 DIAGNOSIS — I10 Essential (primary) hypertension: Secondary | ICD-10-CM

## 2015-05-20 MED ORDER — TRAZODONE HCL 50 MG PO TABS: 50.0000 mg | ORAL_TABLET | Freq: Every evening | ORAL | Status: DC | PRN

## 2015-05-20 MED ORDER — METOPROLOL SUCCINATE ER 25 MG PO TB24: 25.0000 mg | ORAL_TABLET | Freq: Every day | ORAL | Status: DC

## 2015-05-20 NOTE — Patient Instructions (Signed)
We have sent in a medicine for sleep called trazodone. You can take 1 pill about 30 minutes before bedtime. It can take 5-7 days to kick in so give it a chance to work.   We do think that you should be taking 1 medicine for blood pressure and the heart called metoprolol. I have sent in refills to the pharmacy. Take 1 pill daily.   You can use zyrtec or allegra or claritin

## 2015-05-20 NOTE — Progress Notes (Signed)
   Subjective:    Patient ID: [name] Allen, male    DOB: 1951/05/05, 64 y.o.   MRN: [id number]  HPI The patient is a 64 YO man who is coming in for follow up. He is not taking his blood pressure medicines anymore as he thought he did not need them. Having some mild headache, no chest pains, SOB, nausea, confusion. Has been not taking them for several months now.   Review of Systems  Constitutional: Negative for fever, activity change, appetite change, fatigue and unexpected weight change.  Respiratory: Negative for cough, chest tightness, shortness of breath and wheezing.   Cardiovascular: Negative for chest pain, palpitations and leg swelling.  Gastrointestinal: Negative for abdominal pain, diarrhea, constipation and abdominal distention.  Skin: Negative.   Neurological: Positive for headaches. Negative for dizziness, weakness and numbness.  Psychiatric/Behavioral: Negative.       Objective:   Physical Exam  Constitutional: He appears well-developed and well-nourished.  HENT:  Head: Normocephalic and atraumatic.  Eyes: EOM are normal.  Neck: Normal range of motion.  Cardiovascular: Normal rate and regular rhythm.   Pulmonary/Chest: Effort normal and breath sounds normal. No respiratory distress. He has no wheezes.  Abdominal: Soft. Bowel sounds are normal. He exhibits no distension. There is no tenderness.  Musculoskeletal: He exhibits no edema.  Neurological: Coordination normal.  Skin: Skin is warm and dry.  Psychiatric: He has a normal mood and affect.   Filed Vitals:   05/20/15 0938 05/20/15 1001  BP: 192/110 170/108  Pulse: 85   Temp: 98.4 F (36.9 C)   TempSrc: Oral   Resp: 16   Height: 5\' 7"  (1.702 m)   Weight: 169 lb 12.8 oz (77.021 kg)   SpO2: 98%       Assessment & Plan:

## 2015-05-20 NOTE — Assessment & Plan Note (Signed)
Not taking his statin and LDL up to 140. Tried to talk to him about going back on but he does not want medication. Talked with him about decreasing his 28% risk of heart disease or stroke in the next 5 years.

## 2015-05-20 NOTE — Progress Notes (Signed)
Pre visit review using our clinic review tool, if applicable. No additional management support is needed unless otherwise documented below in the visit note. 

## 2015-05-20 NOTE — Assessment & Plan Note (Signed)
BP severely elevated as he is not taking imdur or toprol-xl or lisinopril. Will have him start back on metoprolol (he refuses to take more than one medicine and does not even want to take that). Went over his cardiac risk score of 28% in the next 5 years. Could decrease his risk if he took his statin and BP medication but he does not want to take that either. Come back soon for BP recheck.

## 2015-05-31 DIAGNOSIS — Z125 Encounter for screening for malignant neoplasm of prostate: Secondary | ICD-10-CM | POA: Diagnosis not present

## 2015-05-31 DIAGNOSIS — N529 Male erectile dysfunction, unspecified: Secondary | ICD-10-CM | POA: Diagnosis not present

## 2015-06-18 DIAGNOSIS — H5231 Anisometropia: Secondary | ICD-10-CM | POA: Diagnosis not present

## 2015-06-18 DIAGNOSIS — H5210 Myopia, unspecified eye: Secondary | ICD-10-CM | POA: Diagnosis not present

## 2015-06-20 DIAGNOSIS — Z0100 Encounter for examination of eyes and vision without abnormal findings: Secondary | ICD-10-CM | POA: Diagnosis not present

## 2015-06-22 ENCOUNTER — Other Ambulatory Visit: Payer: Self-pay | Admitting: Internal Medicine

## 2015-06-22 ENCOUNTER — Other Ambulatory Visit: Payer: Self-pay | Admitting: Geriatric Medicine

## 2015-06-22 MED ORDER — TERBINAFINE HCL 250 MG PO TABS: ORAL_TABLET | ORAL | Status: DC

## 2015-06-22 NOTE — Telephone Encounter (Signed)
Sent to pharmacy 

## 2015-07-19 DIAGNOSIS — E291 Testicular hypofunction: Secondary | ICD-10-CM | POA: Diagnosis not present

## 2015-07-19 DIAGNOSIS — N5201 Erectile dysfunction due to arterial insufficiency: Secondary | ICD-10-CM | POA: Diagnosis not present

## 2015-07-28 DIAGNOSIS — E291 Testicular hypofunction: Secondary | ICD-10-CM | POA: Diagnosis not present

## 2015-09-07 ENCOUNTER — Ambulatory Visit (INDEPENDENT_AMBULATORY_CARE_PROVIDER_SITE_OTHER): Payer: Commercial Managed Care - HMO

## 2015-09-07 DIAGNOSIS — Z23 Encounter for immunization: Secondary | ICD-10-CM | POA: Diagnosis not present

## 2015-09-13 DIAGNOSIS — E291 Testicular hypofunction: Secondary | ICD-10-CM | POA: Diagnosis not present

## 2015-09-26 ENCOUNTER — Other Ambulatory Visit: Payer: Self-pay | Admitting: Internal Medicine

## 2015-09-27 ENCOUNTER — Other Ambulatory Visit: Payer: Self-pay | Admitting: Geriatric Medicine

## 2015-09-27 MED ORDER — TERBINAFINE HCL 250 MG PO TABS: ORAL_TABLET | ORAL | Status: DC

## 2015-09-27 NOTE — Telephone Encounter (Signed)
Sent to pharmacy 

## 2015-09-28 DIAGNOSIS — E291 Testicular hypofunction: Secondary | ICD-10-CM | POA: Diagnosis not present

## 2015-10-04 ENCOUNTER — Ambulatory Visit (INDEPENDENT_AMBULATORY_CARE_PROVIDER_SITE_OTHER): Payer: Medicare Other | Admitting: Internal Medicine

## 2015-10-04 ENCOUNTER — Encounter: Payer: Self-pay | Admitting: Internal Medicine

## 2015-10-04 ENCOUNTER — Ambulatory Visit: Payer: Commercial Managed Care - HMO | Admitting: Internal Medicine

## 2015-10-04 ENCOUNTER — Other Ambulatory Visit (INDEPENDENT_AMBULATORY_CARE_PROVIDER_SITE_OTHER): Payer: Medicare Other

## 2015-10-04 VITALS — BP 150/92 | HR 67 | Temp 98.1°F | Resp 16 | Ht 67.0 in | Wt 173.0 lb

## 2015-10-04 DIAGNOSIS — Z0000 Encounter for general adult medical examination without abnormal findings: Secondary | ICD-10-CM

## 2015-10-04 DIAGNOSIS — R252 Cramp and spasm: Secondary | ICD-10-CM | POA: Insufficient documentation

## 2015-10-04 DIAGNOSIS — E7800 Pure hypercholesterolemia, unspecified: Secondary | ICD-10-CM | POA: Diagnosis not present

## 2015-10-04 LAB — COMPREHENSIVE METABOLIC PANEL
ALT: 8 U/L (ref 0–53)
AST: 12 U/L (ref 0–37)
Albumin: 3.9 g/dL (ref 3.5–5.2)
Alkaline Phosphatase: 90 U/L (ref 39–117)
BILIRUBIN TOTAL: 0.2 mg/dL (ref 0.2–1.2)
BUN: 5 mg/dL — ABNORMAL LOW (ref 6–23)
CALCIUM: 9.5 mg/dL (ref 8.4–10.5)
CO2: 29 meq/L (ref 19–32)
CREATININE: 0.99 mg/dL (ref 0.40–1.50)
Chloride: 105 mEq/L (ref 96–112)
GFR: 97.3 mL/min (ref >60.00)
Glucose, Bld: 87 mg/dL (ref 70–99)
Potassium: 4.4 mEq/L (ref 3.5–5.1)
Sodium: 140 mEq/L (ref 135–145)
Total Protein: 7.6 g/dL (ref 6.0–8.3)

## 2015-10-04 LAB — LIPID PANEL
Cholesterol: 206 mg/dL — ABNORMAL HIGH (ref 0–200)
HDL: 33.1 mg/dL — ABNORMAL LOW (ref >39.00)
LDL CALC: 150 mg/dL — AB (ref 0–99)
NONHDL: 172.53
Total CHOL/HDL Ratio: 6
Triglycerides: 114 mg/dL (ref 0.0–149.0)
VLDL: 22.8 mg/dL (ref 0.0–40.0)

## 2015-10-04 LAB — MAGNESIUM: MAGNESIUM: 2.1 mg/dL (ref 1.5–2.5)

## 2015-10-04 LAB — CK: Total CK: 158 U/L (ref 7–232)

## 2015-10-04 NOTE — Assessment & Plan Note (Signed)
Checking CMP, CK, magnesium today and adjust as needed. Advised to start taking a multivitamin. Can use heating pad or massage to help with the pain.

## 2015-10-04 NOTE — Progress Notes (Signed)
   Subjective:    Patient ID: [name] Allen, male    DOB: [birth year], 65 y.o.   MRN: [id number]  HPI The patient is a 65 YO man coming in for muscle pain in the lower right leg. He noticed it started about 1 month ago. He denies fevers or chills. No swelling in the area. Hurts when he is standing for about 20-30 minutes at a time. Goes away when he sits down. Has not tried anything else for it.   Review of Systems  Constitutional: Negative for fever, activity change, appetite change, fatigue and unexpected weight change.  Respiratory: Negative for cough, chest tightness, shortness of breath and wheezing.   Cardiovascular: Negative for chest pain, palpitations and leg swelling.  Gastrointestinal: Negative for abdominal pain, diarrhea, constipation and abdominal distention.  Musculoskeletal: Positive for myalgias.  Skin: Negative.   Neurological: Negative for dizziness, weakness, numbness and headaches.  Psychiatric/Behavioral: Negative.       Objective:   Physical Exam  Constitutional: He appears well-developed and well-nourished.  HENT:  Head: Normocephalic and atraumatic.  Eyes: EOM are normal.  Neck: Normal range of motion.  Cardiovascular: Normal rate and regular rhythm.   Pulmonary/Chest: Effort normal and breath sounds normal. No respiratory distress. He has no wheezes.  Abdominal: Soft. Bowel sounds are normal. He exhibits no distension. There is no tenderness.  Musculoskeletal: He exhibits no tenderness.  Neurological: Coordination normal.  Skin: Skin is warm and dry.  Psychiatric: He has a normal mood and affect.   Filed Vitals:   10/04/15 1051  BP: 150/92  Pulse: 67  Temp: 98.1 F (36.7 C)  TempSrc: Oral  Resp: 16  Height: 5\' 7"  (1.702 m)  Weight: 173 lb (78.472 kg)  SpO2: 98%      Assessment & Plan:

## 2015-10-04 NOTE — Patient Instructions (Signed)
We will check the labs today and call you back with the results.   I would recommend to try taking a multivitamin to see if that helps to get rid of the cramps.   Muscle Cramps and Spasms Muscle cramps and spasms occur when a muscle or muscles tighten and you have no control over this tightening (involuntary muscle contraction). They are a common problem and can develop in any muscle. The most common place is in the calf muscles of the leg. Both muscle cramps and muscle spasms are involuntary muscle contractions, but they also have differences:   Muscle cramps are sporadic and painful. They may last a few seconds to a quarter of an hour. Muscle cramps are often more forceful and last longer than muscle spasms.  Muscle spasms may or may not be painful. They may also last just a few seconds or much longer. CAUSES  It is uncommon for cramps or spasms to be due to a serious underlying problem. In many cases, the cause of cramps or spasms is unknown. Some common causes are:   Overexertion.   Overuse from repetitive motions (doing the same thing over and over).   Remaining in a certain position for a long period of time.   Improper preparation, form, or technique while performing a sport or activity.   Dehydration.   Injury.   Side effects of some medicines.   Abnormally low levels of the salts and ions in your blood (electrolytes), especially potassium and calcium. This could happen if you are taking water pills (diuretics) or you are pregnant.  Some underlying medical problems can make it more likely to develop cramps or spasms. These include, but are not limited to:   Diabetes.   Parkinson disease.   Hormone disorders, such as thyroid problems.   Alcohol abuse.   Diseases specific to muscles, joints, and bones.   Blood vessel disease where not enough blood is getting to the muscles.  HOME CARE INSTRUCTIONS   Stay well hydrated. Drink enough water and fluids to  keep your urine clear or pale yellow.  It may be helpful to massage, stretch, and relax the affected muscle.  For tight or tense muscles, use a warm towel, heating pad, or hot shower water directed to the affected area.  If you are sore or have pain after a cramp or spasm, applying ice to the affected area may relieve discomfort.  Put ice in a plastic bag.  Place a towel between your skin and the bag.  Leave the ice on for 15-20 minutes, 03-04 times a day.  Medicines used to treat a known cause of cramps or spasms may help reduce their frequency or severity. Only take over-the-counter or prescription medicines as directed by your caregiver. SEEK MEDICAL CARE IF:  Your cramps or spasms get more severe, more frequent, or do not improve over time.  MAKE SURE YOU:   Understand these instructions.  Will watch your condition.  Will get help right away if you are not doing well or get worse.   This information is not intended to replace advice given to you by your health care provider. Make sure you discuss any questions you have with your health care provider.   Document Released: 03/02/2002 Document Revised: 01/05/2013 Document Reviewed: 08/27/2012 Elsevier Interactive Patient Education [employer].

## 2015-10-04 NOTE — Progress Notes (Signed)
Pre visit review using our clinic review tool, if applicable. No additional management support is needed unless otherwise documented below in the visit note. 

## 2015-10-05 LAB — HEPATITIS C ANTIBODY: HCV AB: NEGATIVE

## 2015-10-06 ENCOUNTER — Encounter: Payer: Commercial Managed Care - HMO | Admitting: Cardiology

## 2015-10-12 DIAGNOSIS — E291 Testicular hypofunction: Secondary | ICD-10-CM | POA: Diagnosis not present

## 2015-10-20 ENCOUNTER — Encounter: Payer: Self-pay | Admitting: Internal Medicine

## 2015-10-20 ENCOUNTER — Ambulatory Visit (INDEPENDENT_AMBULATORY_CARE_PROVIDER_SITE_OTHER): Payer: Medicare Other | Admitting: Internal Medicine

## 2015-10-20 VITALS — BP 126/72 | HR 52 | Temp 98.4°F | Resp 20 | Wt 175.0 lb

## 2015-10-20 DIAGNOSIS — M79604 Pain in right leg: Secondary | ICD-10-CM | POA: Diagnosis not present

## 2015-10-20 DIAGNOSIS — F419 Anxiety disorder, unspecified: Secondary | ICD-10-CM | POA: Diagnosis not present

## 2015-10-20 DIAGNOSIS — I10 Essential (primary) hypertension: Secondary | ICD-10-CM | POA: Diagnosis not present

## 2015-10-20 MED ORDER — MELOXICAM 15 MG PO TABS
15.0000 mg | ORAL_TABLET | Freq: Every day | ORAL | Status: DC | PRN
Start: 2015-10-20 — End: 2018-04-11

## 2015-10-20 NOTE — Progress Notes (Signed)
Pre visit review using our clinic review tool, if applicable. No additional management support is needed unless otherwise documented below in the visit note. 
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
## 2015-10-20 NOTE — Patient Instructions (Signed)
Please take all new medication as prescribed - the anti-inflammatory  Please continue all other medications as before, and refills have been done if requested.  Please have the pharmacy call with any other refills you may need.  Please keep your appointments with your specialists as you may have planned  You will be contacted regarding the referral for: Dr Smith/sports medicine for possible tendon or nerve problem in the leg

## 2015-10-21 ENCOUNTER — Telehealth: Payer: Self-pay | Admitting: Internal Medicine

## 2015-10-21 NOTE — Telephone Encounter (Signed)
Did anyone call this patient

## 2015-10-21 NOTE — Telephone Encounter (Signed)
Did you call patient?

## 2015-10-21 NOTE — Telephone Encounter (Signed)
I think Ria Comment did. He just needs an appointment with Dr. Tamala Julian. Dr. Jenny Reichmann put in a referral for him.

## 2015-10-22 DIAGNOSIS — M79604 Pain in right leg: Secondary | ICD-10-CM | POA: Insufficient documentation

## 2015-10-22 NOTE — Assessment & Plan Note (Signed)
Mild situational, declines change in tx or need for counseling at this time

## 2015-10-22 NOTE — Assessment & Plan Note (Signed)
stable overall by history and exam, recent data reviewed with pt, and pt to continue medical treatment as before,  to f/u any worsening symptoms or concerns BP Readings from Last 3 Encounters:  10/20/15 126/72  10/04/15 150/92  05/20/15 170/108

## 2015-10-22 NOTE — Assessment & Plan Note (Signed)
Persistent RLE pain, suspect msk likely muscle/tendon like syndrome, cant r/o neuritic or vascular but less likely, for now for nsaid prn, also refer Sport med/dr Tamala Julian for consideration further evaluation

## 2015-10-24 NOTE — Telephone Encounter (Signed)
Called pt, scheduled him a new pt appt with dr Tamala Julian.

## 2015-10-26 DIAGNOSIS — N5201 Erectile dysfunction due to arterial insufficiency: Secondary | ICD-10-CM | POA: Diagnosis not present

## 2015-10-26 DIAGNOSIS — E291 Testicular hypofunction: Secondary | ICD-10-CM | POA: Diagnosis not present

## 2015-10-31 ENCOUNTER — Encounter: Payer: Self-pay | Admitting: Cardiology

## 2015-11-02 ENCOUNTER — Encounter: Payer: Self-pay | Admitting: Family Medicine

## 2015-11-02 ENCOUNTER — Ambulatory Visit (INDEPENDENT_AMBULATORY_CARE_PROVIDER_SITE_OTHER): Payer: Medicare Other | Admitting: Family Medicine

## 2015-11-02 VITALS — BP 118/82 | HR 64 | Ht 67.0 in | Wt 173.0 lb

## 2015-11-02 DIAGNOSIS — M79604 Pain in right leg: Secondary | ICD-10-CM | POA: Diagnosis not present

## 2015-11-02 NOTE — Patient Instructions (Addendum)
Good to see you  Ice 20 minutes 2 times daily. Usually after activity and before bed. pennsaid pinkie amount topically 2 times daily as needed.  Spenco orthotics "total support" online would be great  We will check the blood supply to your leg  Possible compression sleeve to the calf can help as well Vitamin D 2000 IU daily  See me again in 3 weeks and if not better we will consider further imaging and possible big boot.
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
## 2015-11-02 NOTE — Assessment & Plan Note (Addendum)
With patient's mottled appearance of his leg and history of coronary artery disease I'm concern for more of a peripheral vascular disease that is likely contributing to some of the discomfort. Patient denies any back pain so I do not think that there is any neurogenic claudication that is occurring at this time. Patient will try compression sleeve, over-the-counter orthotics, as well as icing protocol. We will be getting a venous Doppler as well as an ABI. This will help evaluate for any significant blood flow ordered clot that could be possible. Patient has not had any shortness of breath and I'm do put deep venous thrombosis is a low likelihood we should rule it out secondary to his history. Patient knows if any worsening symptoms to go to the emergency department immediately. We discussed proper shoes. Patient given home exercises by athletic trainer today for the calf. Patient will come back and see me again in 2-3 weeks for further evaluation and treatment. At that time if continuing to have difficulty can advance testing may be necessary.  Stress reaction is another possibility a referred pain from the knee and possible x-rays will be needed. I think these are low likelihood as well.

## 2015-11-02 NOTE — Progress Notes (Signed)
Pre visit review using our clinic review tool, if applicable. No additional management support is needed unless otherwise documented below in the visit note. 

## 2015-11-03 ENCOUNTER — Other Ambulatory Visit: Payer: Self-pay

## 2015-11-03 DIAGNOSIS — M79604 Pain in right leg: Secondary | ICD-10-CM

## 2015-11-04 ENCOUNTER — Ambulatory Visit (HOSPITAL_COMMUNITY)
Admission: RE | Admit: 2015-11-04 | Discharge: 2015-11-04 | Disposition: A | Discharge status: Home / Self Care | Payer: Medicare Other | Source: Ambulatory Visit | Attending: Urology | Admitting: Urology

## 2015-11-04 DIAGNOSIS — M79604 Pain in right leg: Secondary | ICD-10-CM | POA: Insufficient documentation

## 2015-11-04 DIAGNOSIS — M79661 Pain in right lower leg: Secondary | ICD-10-CM | POA: Insufficient documentation

## 2015-11-04 DIAGNOSIS — E785 Hyperlipidemia, unspecified: Secondary | ICD-10-CM | POA: Insufficient documentation

## 2015-11-09 ENCOUNTER — Ambulatory Visit (HOSPITAL_COMMUNITY)
Admission: RE | Admit: 2015-11-09 | Discharge: 2015-11-09 | Disposition: A | Discharge status: Home / Self Care | Payer: Medicare Other | Source: Ambulatory Visit | Attending: Cardiovascular Disease | Admitting: Cardiovascular Disease

## 2015-11-09 ENCOUNTER — Ambulatory Visit (INDEPENDENT_AMBULATORY_CARE_PROVIDER_SITE_OTHER): Payer: Medicare Other | Admitting: Cardiology

## 2015-11-09 ENCOUNTER — Encounter: Payer: Self-pay | Admitting: Cardiology

## 2015-11-09 ENCOUNTER — Other Ambulatory Visit: Payer: Self-pay | Admitting: Internal Medicine

## 2015-11-09 VITALS — BP 126/78 | HR 80 | Ht 67.0 in | Wt 174.0 lb

## 2015-11-09 DIAGNOSIS — Z87891 Personal history of nicotine dependence: Secondary | ICD-10-CM | POA: Diagnosis not present

## 2015-11-09 DIAGNOSIS — M79604 Pain in right leg: Secondary | ICD-10-CM | POA: Insufficient documentation

## 2015-11-09 DIAGNOSIS — E785 Hyperlipidemia, unspecified: Secondary | ICD-10-CM | POA: Insufficient documentation

## 2015-11-09 DIAGNOSIS — Z951 Presence of aortocoronary bypass graft: Secondary | ICD-10-CM | POA: Diagnosis not present

## 2015-11-09 DIAGNOSIS — I10 Essential (primary) hypertension: Secondary | ICD-10-CM

## 2015-11-09 DIAGNOSIS — I2583 Coronary atherosclerosis due to lipid rich plaque: Secondary | ICD-10-CM

## 2015-11-09 DIAGNOSIS — I2510 Atherosclerotic heart disease of native coronary artery without angina pectoris: Secondary | ICD-10-CM | POA: Diagnosis not present

## 2015-11-09 MED ORDER — ATORVASTATIN CALCIUM 40 MG PO TABS: 40.0000 mg | ORAL_TABLET | Freq: Every day | ORAL | Status: DC

## 2015-11-09 NOTE — Patient Instructions (Signed)
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
## 2015-11-23 ENCOUNTER — Ambulatory Visit: Payer: Medicare Other | Admitting: Family Medicine

## 2015-11-26 ENCOUNTER — Other Ambulatory Visit: Payer: Self-pay | Admitting: Internal Medicine

## 2015-11-29 ENCOUNTER — Other Ambulatory Visit: Payer: Self-pay | Admitting: Internal Medicine

## 2015-12-05 ENCOUNTER — Ambulatory Visit (INDEPENDENT_AMBULATORY_CARE_PROVIDER_SITE_OTHER): Payer: Medicare Other | Admitting: Family Medicine

## 2015-12-05 ENCOUNTER — Encounter: Payer: Self-pay | Admitting: Family Medicine

## 2015-12-05 ENCOUNTER — Ambulatory Visit (INDEPENDENT_AMBULATORY_CARE_PROVIDER_SITE_OTHER)
Admission: RE | Admit: 2015-12-05 | Discharge: 2015-12-05 | Disposition: A | Discharge status: Home / Self Care | Payer: Medicare Other | Source: Ambulatory Visit | Attending: Family Medicine | Admitting: Family Medicine

## 2015-12-05 VITALS — BP 136/80 | HR 70 | Ht 67.0 in | Wt 172.0 lb

## 2015-12-05 DIAGNOSIS — M19071 Primary osteoarthritis, right ankle and foot: Secondary | ICD-10-CM

## 2015-12-05 DIAGNOSIS — M25571 Pain in right ankle and joints of right foot: Secondary | ICD-10-CM

## 2015-12-05 DIAGNOSIS — M129 Arthropathy, unspecified: Secondary | ICD-10-CM | POA: Diagnosis not present

## 2015-12-05 NOTE — Progress Notes (Signed)
[name] [place], [place] [address] Phone: [phone] Subjective:    I'm seeing this patient by the request  of:  Hoyt Koch, MD   CC: Right leg pain  [id number] [name] Allen is a 65 y.o. male coming in with complaint of right leg pain. Patient was found to have what seemed to be more ischemic changes. Patient was sent to have a Doppler as well as ABIs. These were fairly unremarkable and independently visualized by me. Patient states that he has been doing some the exercises, icing, and make some difference approximately 55-60% better. Still has discomfort when standing long amount of time or walking. Still on the anterior aspect of the ankle. Denies it giving out on him at all.States though that it continues to give him discomfort.     Past Medical History  Diagnosis Date  . Anxiety   . Coronary artery disease 2003    status post CABG 2005  //     [place] 03/26/12: Ostial LAD 30-40%, mid LAD 20%, circumflex with minor irregularities less than 10%, mid RCA 99%, LIMA-LAD patent but with significant competitive flow from the native LAD, EF 55-65%.  PCI: Evolve 2 study stent to the mid RCA.  [name] Dysrhythmia 2003    irregular  . Depression ~ 2005  . HLD (hyperlipidemia)   . Ejection fraction     EF 55-65%, catheterization, July, 2013  . Hypertension    Past Surgical History  Procedure Laterality Date  . Coronary angioplasty with stent placement  03/26/12    "1"  . Coronary artery bypass graft  2003    CABG X1  . Inguinal hernia repair  ~ 2008    right  . Left heart catheterization with coronary/graft angiogram  03/26/2012    Procedure: LEFT HEART CATHETERIZATION WITH Beatrix Fetters;  Surgeon: Peter M Martinique, MD;  Location: [facility] CATH LAB;  Service: Cardiovascular;;  . Percutaneous coronary stent intervention (pci-s)  03/26/2012    Procedure: PERCUTANEOUS CORONARY STENT INTERVENTION (PCI-S);  Surgeon: Peter M Martinique, MD;   Location: [facility] CATH LAB;  Service: Cardiovascular;;   Social History   Social History  . Marital Status: Legally Separated    Spouse Name: N/A  . Number of Children: N/A  . Years of Education: N/A   Social History Main Topics  . Smoking status: Former Smoker -- 0.10 packs/day for 10 years    Types: Cigarettes    Quit date: 06/30/2001  . Smokeless tobacco: Never Used  . Alcohol Use: 0.0 oz/week    0 Standard drinks or equivalent per week     Comment: "drink alcohol once/yr; motorcycle ralley"  . Drug Use: Yes    Special: Marijuana     Comment: "tried marijuana one time in 1970; couldn't even talk"  . Sexual Activity: Not Currently   Other Topics Concern  . None   Social History Narrative   No Known Allergies Family History  Problem Relation Age of Onset  . Heart disease Mother   . Depression Mother   . Diabetes Mother   . Early death Mother   . Hypertension Mother   . Hyperlipidemia Mother   . Kidney disease Father   . Early death Father   . Cancer Father   . Hypertension Sister     Past medical history, social, surgical and family history all reviewed in electronic medical record.  No pertanent information unless stated regarding to the chief complaint.   Review  of Systems: No headache, visual changes, nausea, vomiting, diarrhea, constipation, dizziness, abdominal pain, skin rash, fevers, chills, night sweats, weight loss, swollen lymph nodes, body aches, joint swelling, muscle aches, chest pain, shortness of breath, mood changes.   Objective Blood pressure 136/80, pulse 70, height 5\' 7"  (1.702 m), weight 172 lb (78.019 kg), SpO2 99 %.  General: No apparent distress alert and oriented x3 mood and affect normal, dressed appropriately.  HEENT: Pupils equal, extraocular movements intact  Respiratory: Patient's speak in full sentences and does not appear short of breath  Cardiovascular: No lower extremity edema, non tender, no erythema  Skin: Warm dry intact with no  signs of infection or rash on extremities or on axial skeleton.  Abdomen: Soft nontender  Neuro: Cranial nerves II through XII are intact, neurovascularly intact in all extremities with 2+ DTRs and 2+ pulses.  Lymph: No lymphadenopathy of posterior or anterior cervical chain or axillae bilaterally.  Gait normal with good balance and coordination.  MSK:  Non tender with full range of motion and good stability and symmetric strength and tone of shoulders, elbows, wrist, hip, knee and ankles bilaterally. Arthritic changes of multiple joints. Patient does have some limitation in dorsiflexion secondary to pain of the ankle. Mild crepitus with range of motion. Tender to palpation over the anterior talar dome. Mild pain over the dorsal aspect of the foot.  Foot exam shows the patient does have mild pes planus bilaterally right greater left. Patient does have significant toe fungus. Mild breakdown of the transverse arch. Mottled appearance of skin seems less than previous exam but still has significant amount over the dorsal foot.. Mild coldness to palpation. Negative Thompson test. More tenderness to palpation over the anterior aspect of the ankle.     .   Impression and Recommendations:     This case required medical decision making of moderate complexity.      Note: This dictation was prepared with Dragon dictation along with smaller phrase technology. Any transcriptional errors that result from this process are unintentional.

## 2015-12-05 NOTE — Patient Instructions (Signed)
Good to see you  Xray downstairs today   Wear good shoes with padding.  Try the brace if it helps pennsaid pinkie amount topically 2 times daily as needed.  Over the counter medicines I would like you to get  Vitamin D 2000 IU daily  Fish oil 2 grams daily  Turmeric 500mg  daily they should help with arthritis.  See me again in 6 weeks.

## 2015-12-05 NOTE — Progress Notes (Signed)
Pre visit review using our clinic review tool, if applicable. No additional management support is needed unless otherwise documented below in the visit note. 

## 2015-12-05 NOTE — Assessment & Plan Note (Signed)
I believe the patient does have more of an anterior impingement syndrome. Discussed with patient at great length. We discussed icing, patient will try topical anti-inflammatories and given a trial size. We discussed home exercises. Patient given a brace. X-rays ordered today. If patient does not make any improvement in 6 weeks we'll consider injection.  Spent  25 minutes with patient face-to-face and had greater than 50% of counseling including as described above in assessment and plan.

## 2016-01-16 ENCOUNTER — Ambulatory Visit: Payer: Medicare Other | Admitting: Family Medicine

## 2016-01-16 DIAGNOSIS — Z0289 Encounter for other administrative examinations: Secondary | ICD-10-CM

## 2016-01-26 ENCOUNTER — Other Ambulatory Visit: Payer: Self-pay | Admitting: Family Medicine

## 2016-03-04 ENCOUNTER — Other Ambulatory Visit: Payer: Self-pay | Admitting: Internal Medicine

## 2016-05-04 ENCOUNTER — Other Ambulatory Visit: Payer: Self-pay | Admitting: Internal Medicine

## 2016-06-07 ENCOUNTER — Other Ambulatory Visit: Payer: Self-pay | Admitting: Internal Medicine

## 2016-06-14 ENCOUNTER — Telehealth: Payer: Self-pay | Admitting: Internal Medicine

## 2016-06-14 NOTE — Telephone Encounter (Signed)
Ending email to billing to have [facility] 01/16/2016 no show fee removed

## 2016-07-23 ENCOUNTER — Ambulatory Visit: Payer: Medicare Other | Admitting: Internal Medicine

## 2016-08-28 ENCOUNTER — Other Ambulatory Visit: Payer: Self-pay | Admitting: Internal Medicine

## 2016-09-08 ENCOUNTER — Other Ambulatory Visit: Payer: Self-pay | Admitting: Internal Medicine

## 2016-09-10 NOTE — Progress Notes (Deleted)
Pre visit review using our clinic review tool, if applicable. No additional management support is needed unless otherwise documented below in the visit note. 

## 2016-09-10 NOTE — Progress Notes (Deleted)
Subjective:   [name] Allen is a 65 y.o. male who presents for Medicare Annual/Subsequent preventive examination.  The Patient was informed that the wellness visit is to identify future health risk and educate and initiate measures that can reduce risk for increased disease through the lifespan.   Describes health as fair, good or great?   Review of Systems:  No ROS.  Medicare Wellness Visit.    Sleep patterns:  Home Safety/Smoke Alarms:   Living environment; residence and Audiological scientist Safety/Bike Helmet:   Counseling:   Eye Exam-  Dental-   Male:   CCS-Colonoscopy 11/27/2001, normal.      Flexible Sigmoidoscopy 11/20/2014 PSA-05/31/15, 3.4. Dr. Risa Grill.      Objective:    Vitals: There were no vitals taken for this visit.  There is no height or weight on file to calculate BMI.  Tobacco History  Smoking Status  . Former Smoker  . Packs/day: 0.10  . Years: 10.00  . Types: Cigarettes  . Quit date: 06/30/2001  Smokeless Tobacco  . Never Used     Counseling given: Not Answered   Past Medical History:  Diagnosis Date  . Anxiety   . Coronary artery disease 2003   status post CABG 2005  //     [place] 03/26/12: Ostial LAD 30-40%, mid LAD 20%, circumflex with minor irregularities less than 10%, mid RCA 99%, LIMA-LAD patent but with significant competitive flow from the native LAD, EF 55-65%.  PCI: Evolve 2 study stent to the mid RCA.  . Depression ~ 2005  . Dysrhythmia 2003   irregular  . Ejection fraction    EF 55-65%, catheterization, July, 2013  . HLD (hyperlipidemia)   . Hypertension    Past Surgical History:  Procedure Laterality Date  . CORONARY ANGIOPLASTY WITH STENT PLACEMENT  03/26/12   "1"  . CORONARY ARTERY BYPASS GRAFT  2003   CABG X1  . INGUINAL HERNIA REPAIR  ~ 2008   right  . LEFT HEART CATHETERIZATION WITH CORONARY/GRAFT ANGIOGRAM  03/26/2012   Procedure: LEFT HEART CATHETERIZATION WITH Beatrix Fetters;  Surgeon: Peter M Martinique,  MD;  Location: [facility] CATH LAB;  Service: Cardiovascular;;  . PERCUTANEOUS CORONARY STENT INTERVENTION (PCI-S)  03/26/2012   Procedure: PERCUTANEOUS CORONARY STENT INTERVENTION (PCI-S);  Surgeon: Peter M Martinique, MD;  Location: [facility] CATH LAB;  Service: Cardiovascular;;   Family History  Problem Relation Age of Onset  . Heart disease Mother   . Depression Mother   . Diabetes Mother   . Early death Mother   . Hypertension Mother   . Hyperlipidemia Mother   . Kidney disease Father   . Early death Father   . Cancer Father   . Hypertension Sister    History  Sexual Activity  . Sexual activity: Not Currently    Outpatient Encounter Prescriptions as of 09/11/2016  Medication Sig  . aspirin 81 MG tablet Take 81 mg by mouth daily.  [name] atorvastatin (LIPITOR) 40 MG tablet Take 1 tablet (40 mg total) by mouth daily.  . cyclobenzaprine (FLEXERIL) 5 MG tablet Take 1 tablet (5 mg total) by mouth 2 (two) times daily as needed for muscle spasms.  [name] gabapentin (NEURONTIN) 400 MG capsule Take 1 capsule (400 mg total) by mouth 3 (three) times daily.  [name] lisinopril (PRINIVIL,ZESTRIL) 5 MG tablet Take 5 mg by mouth daily.  . meloxicam (MOBIC) 15 MG tablet Take 1 tablet (15 mg total) by mouth daily as needed for pain.  . metoprolol  succinate (TOPROL-XL) 25 MG 24 hr tablet Take 1 tablet (25 mg total) by mouth daily.  . sildenafil (REVATIO) 20 MG tablet TAKE 2 TO 5 TABLETS BY MOUTH AS NEEDED  . terbinafine (LAMISIL) 250 MG tablet Take 250 mg by mouth daily. For jock itch  . terbinafine (LAMISIL) 250 MG tablet TAKE 1 TABLET (250 MG TOTAL) BY MOUTH DAILY.  [name] terbinafine (LAMISIL) 250 MG tablet TAKE 1 TABLET (250 MG TOTAL) BY MOUTH DAILY.  . traZODone (DESYREL) 50 MG tablet TAKE 1 TABLET (50 MG TOTAL) BY MOUTH AT BEDTIME AS NEEDED FOR SLEEP.   No facility-administered encounter medications on file as of 09/11/2016.     Activities of Daily Living No flowsheet data found.  Patient Care Team: Hoyt Koch,  MD as PCP - General (Internal Medicine)   Assessment:    Physical assessment deferred to PCP.  Exercise Activities and Dietary recommendations   Diet (meal preparation, eat out, water intake, caffeinated beverages, dairy products, fruits and vegetables):  Breakfast: Lunch:  Dinner:      Goals    None     Fall Risk Fall Risk  10/04/2015  Falls in the past year? No   Depression Screen PHQ 2/9 Scores 10/04/2015 08/25/2014 08/13/2014 06/03/2012  PHQ - 2 Score 0 0 0 0    Cognitive Function        Immunization History  Administered Date(s) Administered  . Influenza, High Dose Seasonal PF 09/07/2015  . Pneumococcal Polysaccharide-23 11/19/2014  . Tdap 02/29/2012   Screening Tests Health Maintenance  Topic Date Due  . ZOSTAVAX  11/16/2009  . COLONOSCOPY  11/28/2011  . PNA vac Low Risk Adult (2 of 2 - PCV13) 11/20/2015  . INFLUENZA VACCINE  04/24/2016  . TETANUS/TDAP  02/28/2022  . Hepatitis C Screening  Completed      Plan:    During the course of the visit the patient was educated and counseled about the following appropriate screening and preventive services:   Vaccines to include Pneumoccal, Influenza, Hepatitis B, Td, Zostavax, HCV  Electrocardiogram  Cardiovascular Disease  Colorectal cancer screening  Diabetes screening  Prostate Cancer Screening  Glaucoma screening  Nutrition counseling   Smoking cessation counseling  Patient Instructions (the written plan) was given to the patient.    Gerilyn Nestle, RN  09/10/2016

## 2016-09-11 ENCOUNTER — Ambulatory Visit: Payer: Medicare Other

## 2016-11-08 ENCOUNTER — Ambulatory Visit (INDEPENDENT_AMBULATORY_CARE_PROVIDER_SITE_OTHER): Payer: Medicare Other | Admitting: Cardiology

## 2016-11-08 ENCOUNTER — Encounter: Payer: Self-pay | Admitting: Cardiology

## 2016-11-08 VITALS — BP 130/72 | HR 82 | Ht 67.0 in | Wt 162.6 lb

## 2016-11-08 DIAGNOSIS — I2583 Coronary atherosclerosis due to lipid rich plaque: Secondary | ICD-10-CM

## 2016-11-08 DIAGNOSIS — E7800 Pure hypercholesterolemia, unspecified: Secondary | ICD-10-CM

## 2016-11-08 DIAGNOSIS — I2510 Atherosclerotic heart disease of native coronary artery without angina pectoris: Secondary | ICD-10-CM

## 2016-11-08 DIAGNOSIS — Z951 Presence of aortocoronary bypass graft: Secondary | ICD-10-CM | POA: Diagnosis not present

## 2016-11-08 DIAGNOSIS — I10 Essential (primary) hypertension: Secondary | ICD-10-CM

## 2016-11-08 NOTE — Progress Notes (Signed)
Cardiology Office Note    Date:  11/08/2016   ID:  [name] Allen, DOB 10-Oct-1950, MRN [id number]  PCP:  Hoyt Koch, MD  Cardiologist:   Candee Furbish, MD (Former Dr. Ron Parker)    History of Present Illness:  [name] Allen is a 66 y.o. male with coronary artery disease status post bypass surgery in 2005 with subsequent PCI here to follow-up. Former patient of Dr. Ron Parker.  In the past, he did not feel well with Plavix. He has therefore been on aspirin only.  Nuclear stress test February 2014. No ischemia. He has done well. No chest pain. He did ask me what can be done for depression.  Since we last saw each other, he took himself off of the Toprol 25 mg.  Right leg pain. Saw ortho. Reassuring workup  No bleeding, no fevers, no chills, no syncope, no orthopnea.  Wearing his cowboy boots.  Past Medical History:  Diagnosis Date  . Anxiety   . Coronary artery disease 2003   status post CABG 2005  //     [place] 03/26/12: Ostial LAD 30-40%, mid LAD 20%, circumflex with minor irregularities less than 10%, mid RCA 99%, LIMA-LAD patent but with significant competitive flow from the native LAD, EF 55-65%.  PCI: Evolve 2 study stent to the mid RCA.  . Depression ~ 2005  . Dysrhythmia 2003   irregular  . Ejection fraction    EF 55-65%, catheterization, July, 2013  . HLD (hyperlipidemia)   . Hypertension     Past Surgical History:  Procedure Laterality Date  . CORONARY ANGIOPLASTY WITH STENT PLACEMENT  03/26/12   "1"  . CORONARY ARTERY BYPASS GRAFT  2003   CABG X1  . INGUINAL HERNIA REPAIR  ~ 2008   right  . LEFT HEART CATHETERIZATION WITH CORONARY/GRAFT ANGIOGRAM  03/26/2012   Procedure: LEFT HEART CATHETERIZATION WITH Beatrix Fetters;  Surgeon: Peter M Martinique, MD;  Location: [facility] CATH LAB;  Service: Cardiovascular;;  . PERCUTANEOUS CORONARY STENT INTERVENTION (PCI-S)  03/26/2012   Procedure: PERCUTANEOUS CORONARY STENT INTERVENTION (PCI-S);  Surgeon: Peter M Martinique, MD;  Location:  [facility] CATH LAB;  Service: Cardiovascular;;    Outpatient Medications Prior to Visit  Medication Sig Dispense Refill  . aspirin 81 MG tablet Take 81 mg by mouth daily.    [name] atorvastatin (LIPITOR) 40 MG tablet Take 1 tablet (40 mg total) by mouth daily. 30 tablet 11  . cyclobenzaprine (FLEXERIL) 5 MG tablet Take 1 tablet (5 mg total) by mouth 2 (two) times daily as needed for muscle spasms. 90 tablet 0  . lisinopril (PRINIVIL,ZESTRIL) 5 MG tablet Take 5 mg by mouth daily.    . meloxicam (MOBIC) 15 MG tablet Take 1 tablet (15 mg total) by mouth daily as needed for pain. 30 tablet 2  . terbinafine (LAMISIL) 250 MG tablet TAKE 1 TABLET (250 MG TOTAL) BY MOUTH DAILY. 90 tablet 0  . traZODone (DESYREL) 50 MG tablet TAKE 1 TABLET (50 MG TOTAL) BY MOUTH AT BEDTIME AS NEEDED FOR SLEEP. 30 tablet 1  . gabapentin (NEURONTIN) 400 MG capsule Take 1 capsule (400 mg total) by mouth 3 (three) times daily. 90 capsule 7  . metoprolol succinate (TOPROL-XL) 25 MG 24 hr tablet Take 1 tablet (25 mg total) by mouth daily. 90 tablet 3  . sildenafil (REVATIO) 20 MG tablet TAKE 2 TO 5 TABLETS BY MOUTH AS NEEDED  6  . terbinafine (LAMISIL) 250 MG tablet Take 250 mg by mouth daily.  For jock itch    . terbinafine (LAMISIL) 250 MG tablet TAKE 1 TABLET (250 MG TOTAL) BY MOUTH DAILY. 90 tablet 0   No facility-administered medications prior to visit.      Allergies:   Patient has no known allergies.   Social History   Social History  . Marital status: Legally Separated    Spouse name: N/A  . Number of children: N/A  . Years of education: N/A   Social History Main Topics  . Smoking status: Former Smoker    Packs/day: 0.10    Years: 10.00    Types: Cigarettes    Quit date: 06/30/2001  . Smokeless tobacco: Never Used  . Alcohol use 0.0 oz/week     Comment: "drink alcohol once/yr; motorcycle ralley"  . Drug use: Yes    Types: Marijuana     Comment: "tried marijuana one time in 1970; couldn't even talk"  . Sexual  activity: Not Currently   Other Topics Concern  . None   Social History Narrative  . None     Family History:  The patient's family history includes Cancer in his father; Depression in his mother; Diabetes in his mother; Early death in his father and mother; Heart disease in his mother; Hyperlipidemia in his mother; Hypertension in his mother and sister; Kidney disease in his father.   ROS:   Please see the history of present illness.   Leg pain. No chest pain, no shortness of breath. No bleeding. ROS All other systems reviewed and are negative.   PHYSICAL EXAM:   VS:  BP 130/72   Pulse 82   Ht 5\' 7"  (1.702 m)   Wt 162 lb 9.6 oz (73.8 kg)   SpO2 98%   BMI 25.47 kg/m    GEN: Well nourished, well developed, in no acute distress HEENT: normal Neck: no JVD, carotid bruits, or masses Cardiac: RRR; no murmurs, rubs, or gallops,no edema  Respiratory:  clear to auscultation bilaterally, normal work of breathing GI: soft, nontender, nondistended, + BS MS: no deformity or atrophy Skin: warm and dry, no rash, wearing cowboy boots Neuro:  Alert and Oriented x 3, Strength and sensation are intact Psych: euthymic mood, full affect  Wt Readings from Last 3 Encounters:  11/08/16 162 lb 9.6 oz (73.8 kg)  12/05/15 172 lb (78 kg)  11/09/15 174 lb (78.9 kg)      Studies/Labs Reviewed:   EKG:  EKG is ordered today.  The ekg ordered today demonstrates2/15/18 shows sinus rhythm, 69 with nonspecific T-wave changes. 11/09/15-sinus rhythm, 67, nonspecific ST-T wave changes. Personally viewed.  Recent Labs: No results found for requested labs within last 8760 hours.   Lipid Panel    Component Value Date/Time   CHOL 206 (H) 10/04/2015 1118   TRIG 114.0 10/04/2015 1118   HDL 33.10 (L) 10/04/2015 1118   CHOLHDL 6 10/04/2015 1118   VLDL 22.8 10/04/2015 1118   LDLCALC 150 (H) 10/04/2015 1118    Additional studies/ records that were reviewed today include:  Prior office notes reviewed,  stress test reviewed, lab work reviewed  Arterial Dopplers 11/09/15:No evidence of segmental lower extremity arterial disease at rest, bilaterally. Normal ABI's, bilaterally.   ASSESSMENT:    1. Coronary artery disease due to lipid rich plaque   2. S/P CABG (coronary artery bypass graft)   3. Essential hypertension   4. Pure hypercholesterolemia      PLAN:  In order of problems listed above:  Coronary artery disease-status post bypass and  subsequent PCI. No anginal symptoms, stress test 2014 low risk, no ischemia. Reassuring. Continue with aggressive secondary prevention.He has taken himself off the Toprol which is reasonable. Doing well. Continue with aspirin.  Hyperlipidemia-LDL 140-150. I have refilled his atorvastatin 40 mg once a day. I think he is unsure whether or not he was taking that medication. Dr. Sharlet Salina can continue with this prescription.  Right leg pain-venous ultrasound normal. No evidence of segmental lower extremity arterial disease at rest, bilaterally. Normal ABI's, bilaterally.. Pain is described as anterior lower leg that started suddenly while standing and talking for instance. Doing well.  Essential hypertension-currently well controlled. Medications reviewed.  Depression-asked him to discuss this further with Dr. Sharlet Salina.  We will see him back on as-needed basis. His coronary disease is currently stable.    Medication Adjustments/Labs and Tests Ordered: Current medicines are reviewed at length with the patient today.  Concerns regarding medicines are outlined above.  Medication changes, Labs and Tests ordered today are listed in the Patient Instructions below. Patient Instructions  Medication Instructions:  The current medical regimen is effective;  continue present plan and medications.  Follow-Up: Follow up with your primary care doctor.  Follow up with D Julianne Chamberlin as needed.  Thank you for choosing [facility]!!           Signed, Candee Furbish, MD  11/08/2016 9:35 AM    [facility] [place], [place], [place]  [address] Phone: [phone]; Fax: [phone]

## 2016-11-08 NOTE — Patient Instructions (Signed)
Medication Instructions:  The current medical regimen is effective;  continue present plan and medications.  Follow-Up: Follow up with your primary care doctor.  Follow up with D Skains as needed.  Thank you for choosing [facility]!!

## 2016-11-16 ENCOUNTER — Other Ambulatory Visit: Payer: Self-pay | Admitting: Internal Medicine

## 2017-01-23 ENCOUNTER — Other Ambulatory Visit: Payer: Self-pay | Admitting: Internal Medicine

## 2017-03-27 ENCOUNTER — Encounter: Payer: Self-pay | Admitting: Internal Medicine

## 2017-03-30 ENCOUNTER — Emergency Department (HOSPITAL_COMMUNITY)
Admission: EM | Admit: 2017-03-30 | Discharge: 2017-03-30 | Disposition: A | Discharge status: Home / Self Care | Payer: Medicare Other | Attending: Emergency Medicine | Admitting: Emergency Medicine

## 2017-03-30 ENCOUNTER — Emergency Department (HOSPITAL_COMMUNITY): Payer: Medicare Other

## 2017-03-30 ENCOUNTER — Encounter (HOSPITAL_COMMUNITY): Payer: Self-pay | Admitting: Emergency Medicine

## 2017-03-30 DIAGNOSIS — Y999 Unspecified external cause status: Secondary | ICD-10-CM | POA: Insufficient documentation

## 2017-03-30 DIAGNOSIS — Z951 Presence of aortocoronary bypass graft: Secondary | ICD-10-CM | POA: Diagnosis not present

## 2017-03-30 DIAGNOSIS — Z79899 Other long term (current) drug therapy: Secondary | ICD-10-CM | POA: Diagnosis not present

## 2017-03-30 DIAGNOSIS — W010XXA Fall on same level from slipping, tripping and stumbling without subsequent striking against object, initial encounter: Secondary | ICD-10-CM | POA: Diagnosis not present

## 2017-03-30 DIAGNOSIS — M25572 Pain in left ankle and joints of left foot: Secondary | ICD-10-CM | POA: Diagnosis not present

## 2017-03-30 DIAGNOSIS — S99912A Unspecified injury of left ankle, initial encounter: Secondary | ICD-10-CM

## 2017-03-30 DIAGNOSIS — I2510 Atherosclerotic heart disease of native coronary artery without angina pectoris: Secondary | ICD-10-CM | POA: Diagnosis not present

## 2017-03-30 DIAGNOSIS — S99922A Unspecified injury of left foot, initial encounter: Secondary | ICD-10-CM | POA: Diagnosis not present

## 2017-03-30 DIAGNOSIS — Y93K9 Activity, other involving animal care: Secondary | ICD-10-CM | POA: Insufficient documentation

## 2017-03-30 DIAGNOSIS — Z7982 Long term (current) use of aspirin: Secondary | ICD-10-CM | POA: Diagnosis not present

## 2017-03-30 DIAGNOSIS — Y92017 Garden or yard in single-family (private) house as the place of occurrence of the external cause: Secondary | ICD-10-CM | POA: Insufficient documentation

## 2017-03-30 DIAGNOSIS — Z87891 Personal history of nicotine dependence: Secondary | ICD-10-CM | POA: Diagnosis not present

## 2017-03-30 DIAGNOSIS — I119 Hypertensive heart disease without heart failure: Secondary | ICD-10-CM | POA: Diagnosis not present

## 2017-03-30 MED ORDER — HYDROCODONE-ACETAMINOPHEN 5-325 MG PO TABS: 1.0000 | ORAL_TABLET | Freq: Four times a day (QID) | ORAL | 0 refills | Status: DC | PRN

## 2017-03-30 MED ORDER — HYDROCODONE-ACETAMINOPHEN 5-325 MG PO TABS
1.0000 | ORAL_TABLET | Freq: Once | ORAL | Status: AC
  Administered 2017-03-30: 1 via ORAL
  Filled 2017-03-30: qty 1

## 2017-03-30 NOTE — ED Triage Notes (Signed)
States was running through a [place] and tripped over a stump, injuryng left foot.

## 2017-03-30 NOTE — ED Notes (Signed)
Pt waiting to be seen by EDP before being discharge, due to age.

## 2017-03-30 NOTE — Progress Notes (Signed)
Orthopedic Tech Progress Note Patient Details:  [name] Allen 1951/02/15 [id number]  Ortho Devices Type of Ortho Device: ASO Ortho Device/Splint Location: lue Ortho Device/Splint Interventions: Application   [name] 03/30/2017, 11:42 AM

## 2017-03-30 NOTE — Discharge Instructions (Signed)
Please read instructions below. Apply ice to your ankle for 20 minutes at a time. You can take 600mg  of advil every 6 hours as needed for pain. You can take Norco/Vicodin as needed for severe pain. Do not take tylenol with this medication, do not drive or drink alcohol with this medication. Schedule an appointment with your orthopedic specialist or Dr. Sharol Given in 3-5 days for follow-up on your injury. Return to the ER for new or concerning symptoms.

## 2017-03-30 NOTE — ED Provider Notes (Signed)
[facility] DEPT Provider Note   CSN: [id number] Arrival date & time: 03/30/17  0941  By signing my name below, I, [name] Allen, attest that this documentation has been prepared under the direction and in the presence of [name] Russo, PA-C Electronically Signed: Marcello Allen, ED Scribe. 03/30/17. 10:16 AM.  History   Chief Complaint Chief Complaint  Patient presents with  . Foot Injury   The history is provided by the patient. No language interpreter was used.   HPI Comments: [name] Allen is a 66 y.o. male who presents to the Emergency Department complaining of acute onset of 9/10 "sharp" and achey" left ankle pain s/p mechanical fall that occurred last night. The pt states his horses escaped and once he put them in the stable, he was looking along the fence for a hole when he tripped over a stump falling on his side. Has not taken any medications for pain prior to arrival. States pain is worsened with ambulation and weightbearing, and minimal at rest. Denies head trauma or LOC. Denies numbness or tingling to left foot. No other complaints today.   Past Medical History:  Diagnosis Date  . Anxiety   . Coronary artery disease 2003   status post CABG 2005  //     [place] 03/26/12: Ostial LAD 30-40%, mid LAD 20%, circumflex with minor irregularities less than 10%, mid RCA 99%, LIMA-LAD patent but with significant competitive flow from the native LAD, EF 55-65%.  PCI: Evolve 2 study stent to the mid RCA.  . Depression ~ 2005  . Dysrhythmia 2003   irregular  . Ejection fraction    EF 55-65%, catheterization, July, 2013  . HLD (hyperlipidemia)   . Hypertension     Patient Active Problem List   Diagnosis Date Noted  . Arthritis of right ankle 12/05/2015  . Right leg pain 10/22/2015  . Muscle cramps 10/04/2015  . ED (erectile dysfunction) 03/25/2015  . Toenail fungus 03/25/2015  . Routine general medical examination at a health care facility 11/20/2014  . Lumbar pain with  radiation down left leg 08/25/2014  . Low back pain 06/18/2014  . Coronary artery disease   . Anxiety   . Hypertension   . 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 HYPERCHOLESTEROLEMIA 03/08/2008    Past Surgical History:  Procedure Laterality Date  . CARDIAC SURGERY    . CORONARY ANGIOPLASTY WITH STENT PLACEMENT  03/26/12   "1"  . CORONARY ARTERY BYPASS GRAFT  2003   CABG X1  . INGUINAL HERNIA REPAIR  ~ 2008   right  . LEFT HEART CATHETERIZATION WITH CORONARY/GRAFT ANGIOGRAM  03/26/2012   Procedure: LEFT HEART CATHETERIZATION WITH [name] Allen;  Surgeon: Peter M Martinique, MD;  Location[facility] CATH LAB;  Service: Cardiovascular;;  . PERCUTANEOUS CORONARY STENT INTERVENTION (PCI-S)  03/26/2012   Procedure: PERCUTANEOUS CORONARY STENT INTERVENTION (PCI-S);  Surgeon: Peter M Martinique, MD;  Location: [facility] CATH LAB;  Service: Cardiovascular;;       Home Medications    Prior to Admission medications   Medication Sig Start Date End Date Taking? Authorizing Provider  aspirin 81 MG tablet Take 81 mg by mouth daily.    [provider]  atorvastatin (LIPITOR) 40 MG tablet Take 1 tablet (40 mg total) by mouth daily. 11/09/15   Jerline Pain, MD  cyclobenzaprine (FLEXERIL) 5 MG tablet Take 1 tablet (5 mg total) by mouth 2 (two) times daily as needed for muscle spasms. 06/18/14   Frazier Richards, MD  lisinopril (PRINIVIL,ZESTRIL) 5 MG tablet Take 5 mg by mouth daily.    [provider]  meloxicam (MOBIC) 15 MG tablet Take 1 tablet (15 mg total) by mouth daily as needed for pain. 10/20/15   Biagio Borg, MD  terbinafine (LAMISIL) 250 MG tablet TAKE 1 TABLET (250 MG TOTAL) BY MOUTH DAILY. 06/07/16   Rowe Clack, MD  traZODone (DESYREL) 50 MG tablet TAKE 1 TABLET (50 MG TOTAL) BY MOUTH AT BEDTIME AS NEEDED FOR SLEEP. 01/23/17   Hoyt Koch, MD    Family History Family History  Problem Relation Age of Onset  . Heart disease Mother   . Depression  Mother   . Diabetes Mother   . Early death Mother   . Hypertension Mother   . Hyperlipidemia Mother   . Kidney disease Father   . Early death Father   . Cancer Father   . Hypertension Sister     Social History Social History  Substance Use Topics  . Smoking status: Former Smoker    Packs/day: 0.10    Years: 10.00    Types: Cigarettes    Quit date: 06/30/2001  . Smokeless tobacco: Never Used  . Alcohol use 0.0 oz/week     Comment: "drink alcohol once/yr; motorcycle ralley"     Allergies   Patient has no known allergies.   Review of Systems Review of Systems  Musculoskeletal: Positive for arthralgias and joint swelling. Negative for back pain and neck pain.  Neurological: Negative for numbness.     Physical Exam Updated Vital Signs BP 122/74 (BP Location: Right Arm)   Pulse 72   Temp 98.5 F (36.9 C) (Oral)   Resp 18   Ht 5\' 7"  (1.702 m)   Wt 165 lb (74.8 kg)   SpO2 100%   BMI 25.84 kg/m   Physical Exam  Constitutional: He appears well-developed and well-nourished. No distress.  HENT:  Head: Normocephalic and atraumatic.  Eyes: Conjunctivae are normal.  Cardiovascular: Normal rate.   Pulses:      Dorsalis pedis pulses are 2+ on the right side, and 2+ on the left side.  Intact dorsalis pedis pulses.   Pulmonary/Chest: Effort normal.  Musculoskeletal:  LLE: Edema to lateral ankle, no ecchymosis or gross deformities. Mild tenderness to lateral malleolus. Medial malleolus is nontender. Dorsolateral aspect of foot and ankle with tenderness.  Moving his toes. Dec AROM of ankle 2/t pain. Pain with PROM.  Neurological:  Sensation of distal foot intact.   Skin:  No wounds  Psychiatric: He has a normal mood and affect. His behavior is normal.  Nursing note and vitals reviewed.    ED Treatments / Results   DIAGNOSTIC STUDIES: Oxygen Saturation is 100% on RA, normal by my interpretation.   COORDINATION OF CARE: 10:08 AM-Discussed next steps with pt. Pt  verbalized understanding and is agreeable with the plan.   Labs (all labs ordered are listed, but only abnormal results are displayed) Labs Reviewed - No data to display  EKG  EKG Interpretation None       Radiology Dg Foot Complete Left  Result Date: 03/30/2017 CLINICAL DATA:  Patient status post fall. Foot injury. Initial encounter. EXAM: LEFT FOOT - COMPLETE 3+ VIEW COMPARISON:  None. FINDINGS: Normal anatomic alignment. No evidence for acute fracture or dislocation. Plantar calcaneal spurring. Midfoot degenerative changes. The distal aspect of the distal phalanx of the fifth digit is mildly eroded, nonspecific. IMPRESSION: No acute osseous abnormality. Nonspecific erosion of the distal aspect  of the distal phalanx of the fifth digit. Electronically Signed   By: Lovey Newcomer M.D.   On: 03/30/2017 10:49    Procedures Procedures (including critical care time)  Medications Ordered in ED Medications - No data to display   Initial Impression / Assessment and Plan / ED Course  I have reviewed the triage vital signs and the nursing notes.  Pertinent labs & imaging results that were available during my care of the patient were reviewed by me and considered in my medical decision making (see chart for details).     Patient left ankle pain status post mechanical fall. X-Ray negative for obvious fracture or dislocation. Pain managed in ED. Pt advised to follow up with orthopedics if symptoms persist for possibility of missed fracture diagnosis. Patient given brace while in ED, he brought his own crutches that he is advised to use, conservative therapy recommended and discussed. Orthopedic referral given. Patient will be dc home & is agreeable with above plan.  [place] Controlled Substance reporting System queried  Discussed results, findings, treatment and follow up. Patient advised of return precautions. Patient verbalized understanding and agreed with plan.   Final Clinical  Impressions(s) / ED Diagnoses   Final diagnoses:  Left ankle injury, initial encounter    New Prescriptions Discharge Medication List as of 03/30/2017 11:34 AM    START taking these medications   Details  HYDROcodone-acetaminophen (NORCO/VICODIN) 5-325 MG tablet Take 1-2 tablets by mouth every 6 (six) hours as needed for severe pain., Starting Sat 03/30/2017, Print       I personally performed the services described in this documentation, which was scribed in my presence. The recorded information has been reviewed and is accurate.     Allen, [name] N, PA-C 03/30/17 Berenda Morale, MD 03/31/17 1254

## 2017-04-01 ENCOUNTER — Other Ambulatory Visit: Payer: Self-pay

## 2017-04-01 NOTE — Patient Outreach (Signed)
Outreach patient on 04/01/17.  There was no answer and no voicemail sit up to leave message.

## 2017-04-05 LAB — FECAL OCCULT BLOOD, GUAIAC: FECAL OCCULT BLD: NEGATIVE

## 2017-06-06 ENCOUNTER — Encounter: Payer: Self-pay | Admitting: Internal Medicine

## 2017-06-06 NOTE — Progress Notes (Signed)
Abstracted and sent to scan  

## 2017-06-19 ENCOUNTER — Other Ambulatory Visit: Payer: Self-pay | Admitting: Internal Medicine

## 2017-06-19 NOTE — Telephone Encounter (Signed)
Pharmacy called checking on this refill.

## 2017-07-25 DIAGNOSIS — Z125 Encounter for screening for malignant neoplasm of prostate: Secondary | ICD-10-CM | POA: Diagnosis not present

## 2017-08-01 DIAGNOSIS — Z23 Encounter for immunization: Secondary | ICD-10-CM | POA: Diagnosis not present

## 2017-08-07 ENCOUNTER — Encounter: Payer: Self-pay | Admitting: Internal Medicine

## 2017-08-07 ENCOUNTER — Telehealth: Payer: Self-pay

## 2017-08-07 ENCOUNTER — Telehealth: Payer: Self-pay | Admitting: Internal Medicine

## 2017-08-07 ENCOUNTER — Ambulatory Visit (INDEPENDENT_AMBULATORY_CARE_PROVIDER_SITE_OTHER): Payer: Medicare Other | Admitting: Internal Medicine

## 2017-08-07 ENCOUNTER — Other Ambulatory Visit (INDEPENDENT_AMBULATORY_CARE_PROVIDER_SITE_OTHER): Payer: Medicare Other

## 2017-08-07 VITALS — BP 138/82 | HR 68 | Temp 97.8°F | Ht 67.0 in | Wt 164.0 lb

## 2017-08-07 DIAGNOSIS — R972 Elevated prostate specific antigen [PSA]: Secondary | ICD-10-CM

## 2017-08-07 DIAGNOSIS — I10 Essential (primary) hypertension: Secondary | ICD-10-CM

## 2017-08-07 DIAGNOSIS — Z0000 Encounter for general adult medical examination without abnormal findings: Secondary | ICD-10-CM | POA: Diagnosis not present

## 2017-08-07 DIAGNOSIS — R7301 Impaired fasting glucose: Secondary | ICD-10-CM

## 2017-08-07 DIAGNOSIS — E7800 Pure hypercholesterolemia, unspecified: Secondary | ICD-10-CM

## 2017-08-07 DIAGNOSIS — Z23 Encounter for immunization: Secondary | ICD-10-CM

## 2017-08-07 LAB — COMPREHENSIVE METABOLIC PANEL
ALK PHOS: 94 U/L (ref 39–117)
ALT: 8 U/L (ref 0–53)
AST: 12 U/L (ref 0–37)
Albumin: 3.9 g/dL (ref 3.5–5.2)
BUN: 8 mg/dL (ref 6–23)
CALCIUM: 9.8 mg/dL (ref 8.4–10.5)
CO2: 30 mEq/L (ref 19–32)
Chloride: 103 mEq/L (ref 96–112)
Creatinine, Ser: 0.97 mg/dL (ref 0.40–1.50)
GFR: 99.07 mL/min (ref >60.00)
Glucose, Bld: 96 mg/dL (ref 70–99)
Potassium: 4.1 mEq/L (ref 3.5–5.1)
SODIUM: 139 meq/L (ref 135–145)
Total Bilirubin: 0.4 mg/dL (ref 0.2–1.2)
Total Protein: 7.3 g/dL (ref 6.0–8.3)

## 2017-08-07 LAB — LIPID PANEL
CHOLESTEROL: 204 mg/dL — AB (ref 0–200)
HDL: 29.1 mg/dL — AB (ref >39.00)
LDL Cholesterol: 137 mg/dL — ABNORMAL HIGH (ref 0–99)
NonHDL: 175.16
Total CHOL/HDL Ratio: 7
Triglycerides: 191 mg/dL — ABNORMAL HIGH (ref 0.0–149.0)
VLDL: 38.2 mg/dL (ref 0.0–40.0)

## 2017-08-07 LAB — CBC
HEMATOCRIT: 44.1 % (ref 39.0–52.0)
Hemoglobin: 14.4 g/dL (ref 13.0–17.0)
MCHC: 32.6 g/dL (ref 30.0–36.0)
MCV: 89.2 fl (ref 78.0–100.0)
Platelets: 369 10*3/uL (ref 150.0–400.0)
RBC: 4.95 Mil/uL (ref 4.22–5.81)
RDW: 14.7 % (ref 11.5–15.5)
WBC: 10.5 10*3/uL (ref 4.0–10.5)

## 2017-08-07 LAB — HEMOGLOBIN A1C: Hgb A1c MFr Bld: 6.1 % (ref 4.6–6.5)

## 2017-08-07 LAB — PSA: PSA: 4.53 ng/mL — ABNORMAL HIGH (ref 0.10–4.00)

## 2017-08-07 MED ORDER — PANTOPRAZOLE SODIUM 40 MG PO TBEC: 40.0000 mg | DELAYED_RELEASE_TABLET | Freq: Every day | ORAL | 3 refills | Status: DC

## 2017-08-07 MED ORDER — TRAZODONE HCL 50 MG PO TABS: 50.0000 mg | ORAL_TABLET | Freq: Every day | ORAL | 3 refills | Status: DC

## 2017-08-07 NOTE — Assessment & Plan Note (Signed)
Flu shot up to date. Pneumonia given at visit, tetanus up to date. cologuard ordered. Counseled about sun safety and mole surveillance. Given 10 year screening recommendations.

## 2017-08-07 NOTE — Assessment & Plan Note (Signed)
BP at goal on lisinopril 5 mg daily. Checking CMP and adjust as needed.  

## 2017-08-07 NOTE — Patient Instructions (Signed)
We have given you the pneumonia shot.  We have sent in a medicine for sleeping and a medicine for the heart burn.   Health Maintenance, Male A healthy lifestyle and preventive care is important for your health and wellness. Ask your health care provider about what schedule of regular examinations is right for you. What should I know about weight and diet? Eat a Healthy Diet  Eat plenty of vegetables, fruits, whole grains, low-fat dairy products, and lean protein.  Do not eat a lot of foods high in solid fats, added sugars, or salt.  Maintain a Healthy Weight Regular exercise can help you achieve or maintain a healthy weight. You should:  Do at least 150 minutes of exercise each week. The exercise should increase your heart rate and make you sweat (moderate-intensity exercise).  Do strength-training exercises at least twice a week.  Watch Your Levels of Cholesterol and Blood Lipids  Have your blood tested for lipids and cholesterol every 5 years starting at 67 years of age. If you are at high risk for heart disease, you should start having your blood tested when you are 67 years old. You may need to have your cholesterol levels checked more often if: ? Your lipid or cholesterol levels are high. ? You are older than 67 years of age. ? You are at high risk for heart disease.  What should I know about cancer screening? Many types of cancers can be detected early and may often be prevented. Lung Cancer  You should be screened every year for lung cancer if: ? You are a current smoker who has smoked for at least 30 years. ? You are a former smoker who has quit within the past 15 years.  Talk to your health care provider about your screening options, when you should start screening, and how often you should be screened.  Colorectal Cancer  Routine colorectal cancer screening usually begins at 68 years of age and should be repeated every 5-10 years until you are 67 years old. You may  need to be screened more often if early forms of precancerous polyps or small growths are found. Your health care provider may recommend screening at an earlier age if you have risk factors for colon cancer.  Your health care provider may recommend using home test kits to check for hidden blood in the stool.  A small camera at the end of a tube can be used to examine your colon (sigmoidoscopy or colonoscopy). This checks for the earliest forms of colorectal cancer.  Prostate and Testicular Cancer  Depending on your age and overall health, your health care provider may do certain tests to screen for prostate and testicular cancer.  Talk to your health care provider about any symptoms or concerns you have about testicular or prostate cancer.  Skin Cancer  Check your skin from head to toe regularly.  Tell your health care provider about any new moles or changes in moles, especially if: ? There is a change in a mole's size, shape, or color. ? You have a mole that is larger than a pencil eraser.  Always use sunscreen. Apply sunscreen liberally and repeat throughout the day.  Protect yourself by wearing long sleeves, pants, a wide-brimmed hat, and sunglasses when outside.  What should I know about heart disease, diabetes, and high blood pressure?  If you are 23-25 years of age, have your blood pressure checked every 3-5 years. If you are 54 years of age or older, have  your blood pressure checked every year. You should have your blood pressure measured twice-once when you are at a hospital or clinic, and once when you are not at a hospital or clinic. Record the average of the two measurements. To check your blood pressure when you are not at a hospital or clinic, you can use: ? An automated blood pressure machine at a pharmacy. ? A home blood pressure monitor.  Talk to your health care provider about your target blood pressure.  If you are between 53-29 years old, ask your health care  provider if you should take aspirin to prevent heart disease.  Have regular diabetes screenings by checking your fasting blood sugar level. ? If you are at a normal weight and have a low risk for diabetes, have this test once every three years after the age of 74. ? If you are overweight and have a high risk for diabetes, consider being tested at a younger age or more often.  A one-time screening for abdominal aortic aneurysm (AAA) by ultrasound is recommended for men aged 44-75 years who are current or former smokers. What should I know about preventing infection? Hepatitis B If you have a higher risk for hepatitis B, you should be screened for this virus. Talk with your health care provider to find out if you are at risk for hepatitis B infection. Hepatitis C Blood testing is recommended for:  Everyone born from [birth year] through 1966.  Anyone with known risk factors for hepatitis C.  Sexually Transmitted Diseases (STDs)  You should be screened each year for STDs including gonorrhea and chlamydia if: ? You are sexually active and are younger than 67 years of age. ? You are older than 67 years of age and your health care provider tells you that you are at risk for this type of infection. ? Your sexual activity has changed since you were last screened and you are at an increased risk for chlamydia or gonorrhea. Ask your health care provider if you are at risk.  Talk with your health care provider about whether you are at high risk of being infected with HIV. Your health care provider may recommend a prescription medicine to help prevent HIV infection.  What else can I do?  Schedule regular health, dental, and eye exams.  Stay current with your vaccines (immunizations).  Do not use any tobacco products, such as cigarettes, chewing tobacco, and e-cigarettes. If you need help quitting, ask your health care provider.  Limit alcohol intake to no more than 2 drinks per day. One drink equals 12  ounces of beer, 5 ounces of wine, or 1 ounces of hard liquor.  Do not use street drugs.  Do not share needles.  Ask your health care provider for help if you need support or information about quitting drugs.  Tell your health care provider if you often feel depressed.  Tell your health care provider if you have ever been abused or do not feel safe at home. This information is not intended to replace advice given to you by your health care provider. Make sure you discuss any questions you have with your health care provider. Document Released: 03/08/2008 Document Revised: 05/09/2016 Document Reviewed: 06/14/2015 Elsevier Interactive Patient Education  [employer].

## 2017-08-07 NOTE — Progress Notes (Signed)
   Subjective:    Patient ID: [name] Allen, male    DOB: 12-26-1950, 66 y.o.   MRN: [id number]  HPI Here for medicare wellness and physical, no new complaints. Please see A/P for status and treatment of chronic medical problems.   Diet: heart healthy Physical activity: sedentary Depression/mood screen: negative Hearing: intact to whispered voice Visual acuity: grossly normal with lens, performs annual eye exam  ADLs: capable Fall risk: none Home safety: good Cognitive evaluation: intact to orientation, naming, recall and repetition EOL planning: adv directives discussed  I have personally reviewed and have noted 1. The patient's medical and social history - reviewed today no changes 2. Their use of alcohol, tobacco or illicit drugs 3. Their current medications and supplements 4. The patient's functional ability including ADL's, fall risks, home safety risks and hearing or visual impairment. 5. Diet and physical activities 6. Evidence for depression or mood disorders 7. Care team reviewed and updated (available in snapshot)  Review of Systems  Constitutional: Negative.   HENT: Negative.   Eyes: Negative.   Respiratory: Negative for cough, chest tightness and shortness of breath.   Cardiovascular: Negative for chest pain, palpitations and leg swelling.  Gastrointestinal: Negative for abdominal distention, abdominal pain, constipation, diarrhea, nausea and vomiting.  Musculoskeletal: Negative.   Skin: Negative.   Neurological: Negative.   Psychiatric/Behavioral: Negative.       Objective:   Physical Exam  Constitutional: He is oriented to person, place, and time. He appears well-developed and well-nourished.  HENT:  Head: Normocephalic and atraumatic.  Eyes: EOM are normal.  lens  Neck: Normal range of motion.  Cardiovascular: Normal rate and regular rhythm.  Pulmonary/Chest: Effort normal and breath sounds normal. No respiratory distress. He has no wheezes. He has no  rales.  Abdominal: Soft. Bowel sounds are normal. He exhibits no distension. There is no tenderness. There is no rebound.  Musculoskeletal: He exhibits no edema.  Neurological: He is alert and oriented to person, place, and time. Coordination normal.  Skin: Skin is warm and dry.  Psychiatric: He has a normal mood and affect.   Vitals:   08/07/17 0927  BP: 138/82  Pulse: 68  Temp: 97.8 F (36.6 C)  TempSrc: Oral  SpO2: 99%  Weight: 164 lb (74.4 kg)  Height: 5\' 7"  (1.702 m)      Assessment & Plan:

## 2017-08-07 NOTE — Assessment & Plan Note (Signed)
Checking lipid panel and adjust as needed. Atorvastatin 40 mg daily and adjust for LDL <100.

## 2017-08-07 NOTE — Telephone Encounter (Signed)
Order [id number]

## 2017-08-07 NOTE — Addendum Note (Signed)
Addended by: Raford Pitcher R on: 08/07/2017 10:20 AM   Modules accepted: Orders

## 2017-08-07 NOTE — Telephone Encounter (Signed)
Patient is saying he should of been getting RX for cramps. Please advise.

## 2017-08-07 NOTE — Assessment & Plan Note (Signed)
Checking PSA and he has a biopsy scheduled later this month with urology he is encouraged to keep.

## 2017-08-08 NOTE — Telephone Encounter (Signed)
We are checking the labs to see if there is a cause of the cramps.

## 2017-08-12 ENCOUNTER — Other Ambulatory Visit: Payer: Self-pay | Admitting: Internal Medicine

## 2017-08-12 NOTE — Telephone Encounter (Signed)
Pt called asking since his labs were normal if he will be receiving any medications for his cramps also for the slow urinating at night.  Please advise and call back

## 2017-08-13 DIAGNOSIS — R972 Elevated prostate specific antigen [PSA]: Secondary | ICD-10-CM | POA: Diagnosis not present

## 2017-08-13 NOTE — Telephone Encounter (Signed)
We had advised him to try taking magnesium daily to see if that helps with the cramps. The urination he needs to ask urology when he gets the biopsy done.

## 2017-08-13 NOTE — Telephone Encounter (Signed)
Contacted patient and informed him to get OTC magnesium for cramping to see if that helps and then informed him to talk to urology about slow urination when he gets biopsy done. Patient stated that he did not want biopsy done and wanted just to get medication, told him that he needs to go talk to urology about it still because he needs the biopsy done.

## 2017-08-29 ENCOUNTER — Other Ambulatory Visit: Payer: Self-pay | Admitting: Urology

## 2017-09-06 ENCOUNTER — Encounter (HOSPITAL_BASED_OUTPATIENT_CLINIC_OR_DEPARTMENT_OTHER): Payer: Self-pay | Admitting: *Deleted

## 2017-09-06 ENCOUNTER — Other Ambulatory Visit: Payer: Self-pay

## 2017-09-06 NOTE — Progress Notes (Signed)
Pt instructed npo pmn 12/18 x protonix w sip of water.  To [facility] 12/19 @ 0630.  Pt needs istat on arrival.  ekg in epic. Pt is scheduled OWER. Pt instructed to bring home meds with him.  Pt plans to drive self home upon d/c on Thursday. Yolanda Bonine will be caregiver @ home

## 2017-09-09 DIAGNOSIS — Z1211 Encounter for screening for malignant neoplasm of colon: Secondary | ICD-10-CM | POA: Diagnosis not present

## 2017-09-09 DIAGNOSIS — Z1212 Encounter for screening for malignant neoplasm of rectum: Secondary | ICD-10-CM | POA: Diagnosis not present

## 2017-09-11 ENCOUNTER — Observation Stay (HOSPITAL_BASED_OUTPATIENT_CLINIC_OR_DEPARTMENT_OTHER)
Admission: RE | Admit: 2017-09-11 | Discharge: 2017-09-12 | Disposition: A | Discharge status: Home / Self Care | Payer: Medicare Other | Source: Ambulatory Visit | Attending: Urology | Admitting: Urology

## 2017-09-11 ENCOUNTER — Encounter (HOSPITAL_BASED_OUTPATIENT_CLINIC_OR_DEPARTMENT_OTHER): Payer: Self-pay | Admitting: *Deleted

## 2017-09-11 ENCOUNTER — Ambulatory Visit (HOSPITAL_BASED_OUTPATIENT_CLINIC_OR_DEPARTMENT_OTHER): Payer: Medicare Other | Admitting: Anesthesiology

## 2017-09-11 ENCOUNTER — Encounter (HOSPITAL_BASED_OUTPATIENT_CLINIC_OR_DEPARTMENT_OTHER)
Admission: RE | Disposition: A | Discharge status: Home / Self Care | Payer: Self-pay | Source: Ambulatory Visit | Attending: Urology

## 2017-09-11 DIAGNOSIS — Z79899 Other long term (current) drug therapy: Secondary | ICD-10-CM | POA: Diagnosis not present

## 2017-09-11 DIAGNOSIS — F419 Anxiety disorder, unspecified: Secondary | ICD-10-CM | POA: Diagnosis not present

## 2017-09-11 DIAGNOSIS — N529 Male erectile dysfunction, unspecified: Secondary | ICD-10-CM | POA: Diagnosis present

## 2017-09-11 DIAGNOSIS — Z8719 Personal history of other diseases of the digestive system: Secondary | ICD-10-CM | POA: Insufficient documentation

## 2017-09-11 DIAGNOSIS — I2510 Atherosclerotic heart disease of native coronary artery without angina pectoris: Secondary | ICD-10-CM | POA: Insufficient documentation

## 2017-09-11 DIAGNOSIS — Z8679 Personal history of other diseases of the circulatory system: Secondary | ICD-10-CM | POA: Diagnosis not present

## 2017-09-11 DIAGNOSIS — Z955 Presence of coronary angioplasty implant and graft: Secondary | ICD-10-CM | POA: Diagnosis not present

## 2017-09-11 DIAGNOSIS — N5201 Erectile dysfunction due to arterial insufficiency: Principal | ICD-10-CM | POA: Insufficient documentation

## 2017-09-11 DIAGNOSIS — Z8659 Personal history of other mental and behavioral disorders: Secondary | ICD-10-CM | POA: Insufficient documentation

## 2017-09-11 DIAGNOSIS — E291 Testicular hypofunction: Secondary | ICD-10-CM | POA: Diagnosis not present

## 2017-09-11 DIAGNOSIS — R972 Elevated prostate specific antigen [PSA]: Secondary | ICD-10-CM | POA: Diagnosis not present

## 2017-09-11 DIAGNOSIS — E7800 Pure hypercholesterolemia, unspecified: Secondary | ICD-10-CM | POA: Diagnosis not present

## 2017-09-11 DIAGNOSIS — I10 Essential (primary) hypertension: Secondary | ICD-10-CM | POA: Insufficient documentation

## 2017-09-11 DIAGNOSIS — I252 Old myocardial infarction: Secondary | ICD-10-CM | POA: Diagnosis not present

## 2017-09-11 DIAGNOSIS — Z87891 Personal history of nicotine dependence: Secondary | ICD-10-CM | POA: Insufficient documentation

## 2017-09-11 DIAGNOSIS — F329 Major depressive disorder, single episode, unspecified: Secondary | ICD-10-CM | POA: Insufficient documentation

## 2017-09-11 HISTORY — PX: PENILE PROSTHESIS IMPLANT: SHX240

## 2017-09-11 LAB — POCT I-STAT 4, (NA,K, GLUC, HGB,HCT)
Glucose, Bld: 90 mg/dL (ref 65–99)
HEMATOCRIT: 43 % (ref 39.0–52.0)
Hemoglobin: 14.6 g/dL (ref 13.0–17.0)
POTASSIUM: 3.6 mmol/L (ref 3.5–5.1)
SODIUM: 143 mmol/L (ref 135–145)

## 2017-09-11 LAB — COLOGUARD: COLOGUARD: POSITIVE

## 2017-09-11 SURGERY — INSERTION, PENILE PROSTHESIS, INFLATABLE
Anesthesia: General | Site: Scrotum

## 2017-09-11 MED ORDER — DEXAMETHASONE SODIUM PHOSPHATE 10 MG/ML IJ SOLN
INTRAMUSCULAR | Status: AC
  Filled 2017-09-11: qty 1

## 2017-09-11 MED ORDER — FENTANYL CITRATE (PF) 100 MCG/2ML IJ SOLN
INTRAMUSCULAR | Status: DC | PRN
  Administered 2017-09-11 (×3): 50 ug via INTRAVENOUS
  Administered 2017-09-11: 100 ug via INTRAVENOUS
  Administered 2017-09-11: 50 ug via INTRAVENOUS
  Administered 2017-09-11: 100 ug via INTRAVENOUS
  Administered 2017-09-11: 50 ug via INTRAVENOUS

## 2017-09-11 MED ORDER — VANCOMYCIN HCL 10 G IV SOLR
1500.0000 mg | INTRAVENOUS | Status: AC
  Administered 2017-09-11 (×2): 1500 mg via INTRAVENOUS
  Filled 2017-09-11 (×2): qty 1500

## 2017-09-11 MED ORDER — ONDANSETRON HCL 4 MG/2ML IJ SOLN
4.0000 mg | INTRAMUSCULAR | Status: DC | PRN
  Filled 2017-09-11: qty 2

## 2017-09-11 MED ORDER — ACETAMINOPHEN 500 MG PO TABS
ORAL_TABLET | ORAL | Status: AC
  Filled 2017-09-11: qty 2

## 2017-09-11 MED ORDER — LIDOCAINE 2% (20 MG/ML) 5 ML SYRINGE
INTRAMUSCULAR | Status: AC
  Filled 2017-09-11: qty 5

## 2017-09-11 MED ORDER — ONDANSETRON HCL 4 MG/2ML IJ SOLN
INTRAMUSCULAR | Status: DC | PRN
  Administered 2017-09-11: 4 mg via INTRAVENOUS

## 2017-09-11 MED ORDER — SODIUM CHLORIDE 0.9 % IV SOLN
INTRAVENOUS | Status: AC
  Filled 2017-09-11: qty 100

## 2017-09-11 MED ORDER — ACETAMINOPHEN 500 MG PO TABS
1000.0000 mg | ORAL_TABLET | Freq: Four times a day (QID) | ORAL | Status: AC
  Administered 2017-09-11 – 2017-09-12 (×4): 1000 mg via ORAL
  Filled 2017-09-11: qty 2

## 2017-09-11 MED ORDER — SUGAMMADEX SODIUM 200 MG/2ML IV SOLN
INTRAVENOUS | Status: AC
  Filled 2017-09-11: qty 2

## 2017-09-11 MED ORDER — GENTAMICIN SULFATE 40 MG/ML IJ SOLN
5.0000 mg/kg | INTRAVENOUS | Status: AC
  Administered 2017-09-11: 370 mg via INTRAVENOUS
  Filled 2017-09-11 (×2): qty 9.25

## 2017-09-11 MED ORDER — FENTANYL CITRATE (PF) 100 MCG/2ML IJ SOLN
INTRAMUSCULAR | Status: AC
  Filled 2017-09-11: qty 2

## 2017-09-11 MED ORDER — SULFAMETHOXAZOLE-TRIMETHOPRIM 800-160 MG PO TABS: 1.0000 | ORAL_TABLET | Freq: Two times a day (BID) | ORAL | 0 refills | Status: AC

## 2017-09-11 MED ORDER — ONDANSETRON HCL 4 MG/2ML IJ SOLN
INTRAMUSCULAR | Status: AC
  Filled 2017-09-11: qty 2

## 2017-09-11 MED ORDER — PANTOPRAZOLE SODIUM 40 MG PO TBEC
40.0000 mg | DELAYED_RELEASE_TABLET | Freq: Every day | ORAL | Status: DC
  Administered 2017-09-11: 40 mg via ORAL
  Filled 2017-09-11: qty 1

## 2017-09-11 MED ORDER — LIDOCAINE 2% (20 MG/ML) 5 ML SYRINGE
INTRAMUSCULAR | Status: DC | PRN
  Administered 2017-09-11: 60 mg via INTRAVENOUS

## 2017-09-11 MED ORDER — OXYBUTYNIN CHLORIDE 5 MG PO TABS
5.0000 mg | ORAL_TABLET | Freq: Three times a day (TID) | ORAL | Status: DC | PRN
  Filled 2017-09-11: qty 1

## 2017-09-11 MED ORDER — PROPOFOL 10 MG/ML IV BOLUS
INTRAVENOUS | Status: DC | PRN
  Administered 2017-09-11: 70 mg via INTRAVENOUS

## 2017-09-11 MED ORDER — ALBUMIN HUMAN 5 % IV SOLN
INTRAVENOUS | Status: AC
  Filled 2017-09-11: qty 250

## 2017-09-11 MED ORDER — SODIUM CHLORIDE 0.9 % IV SOLN
INTRAVENOUS | Status: DC
  Administered 2017-09-11 (×2): via INTRAVENOUS
  Filled 2017-09-11: qty 1000

## 2017-09-11 MED ORDER — MIDAZOLAM HCL 2 MG/2ML IJ SOLN
INTRAMUSCULAR | Status: AC
  Filled 2017-09-11: qty 2

## 2017-09-11 MED ORDER — CEFAZOLIN SODIUM-DEXTROSE 1-4 GM/50ML-% IV SOLN
INTRAVENOUS | Status: AC
  Filled 2017-09-11: qty 50

## 2017-09-11 MED ORDER — SUGAMMADEX SODIUM 200 MG/2ML IV SOLN
INTRAVENOUS | Status: DC | PRN
  Administered 2017-09-11: 100 mg via INTRAVENOUS

## 2017-09-11 MED ORDER — VANCOMYCIN HCL 500 MG IV SOLR
INTRAVENOUS | Status: AC
  Filled 2017-09-11: qty 500

## 2017-09-11 MED ORDER — PROPOFOL 10 MG/ML IV BOLUS
INTRAVENOUS | Status: AC
  Filled 2017-09-11: qty 40

## 2017-09-11 MED ORDER — ROCURONIUM BROMIDE 50 MG/5ML IV SOSY
PREFILLED_SYRINGE | INTRAVENOUS | Status: AC
  Filled 2017-09-11: qty 5

## 2017-09-11 MED ORDER — LABETALOL HCL 5 MG/ML IV SOLN
INTRAVENOUS | Status: DC | PRN
  Administered 2017-09-11 (×2): 5 mg via INTRAVENOUS

## 2017-09-11 MED ORDER — SCOPOLAMINE 1 MG/3DAYS TD PT72
1.0000 | MEDICATED_PATCH | TRANSDERMAL | Status: DC
  Administered 2017-09-11: 1 via TRANSDERMAL
  Filled 2017-09-11: qty 1

## 2017-09-11 MED ORDER — ROCURONIUM BROMIDE 10 MG/ML (PF) SYRINGE
PREFILLED_SYRINGE | INTRAVENOUS | Status: DC | PRN
  Administered 2017-09-11: 50 mg via INTRAVENOUS

## 2017-09-11 MED ORDER — LACTATED RINGERS IV SOLN
INTRAVENOUS | Status: DC
  Administered 2017-09-11: 1000 mL via INTRAVENOUS
  Administered 2017-09-11: 10:00:00 via INTRAVENOUS
  Filled 2017-09-11: qty 1000

## 2017-09-11 MED ORDER — CEFAZOLIN SODIUM-DEXTROSE 2-4 GM/100ML-% IV SOLN
2.0000 g | INTRAVENOUS | Status: AC
  Administered 2017-09-11: 2 g via INTRAVENOUS
  Filled 2017-09-11: qty 100

## 2017-09-11 MED ORDER — HYDROMORPHONE HCL 1 MG/ML IJ SOLN
0.5000 mg | INTRAMUSCULAR | Status: DC | PRN
  Filled 2017-09-11: qty 1

## 2017-09-11 MED ORDER — OXYCODONE HCL 5 MG PO TABS
5.0000 mg | ORAL_TABLET | ORAL | Status: DC | PRN
  Administered 2017-09-11 – 2017-09-12 (×3): 5 mg via ORAL
  Filled 2017-09-11: qty 1

## 2017-09-11 MED ORDER — KETOROLAC TROMETHAMINE 30 MG/ML IJ SOLN
INTRAMUSCULAR | Status: DC | PRN
  Administered 2017-09-11: 30 mg via INTRAVENOUS

## 2017-09-11 MED ORDER — KETOROLAC TROMETHAMINE 30 MG/ML IJ SOLN
INTRAMUSCULAR | Status: AC
  Filled 2017-09-11: qty 1

## 2017-09-11 MED ORDER — SODIUM CHLORIDE 0.9 % IJ SOLN
INTRAMUSCULAR | Status: AC
  Filled 2017-09-11: qty 10

## 2017-09-11 MED ORDER — CEFAZOLIN SODIUM 1 G IJ SOLR
INTRAMUSCULAR | Status: AC
  Filled 2017-09-11: qty 20

## 2017-09-11 MED ORDER — ALBUMIN HUMAN 5 % IV SOLN
INTRAVENOUS | Status: DC | PRN
  Administered 2017-09-11: 10:00:00 via INTRAVENOUS

## 2017-09-11 MED ORDER — LABETALOL HCL 5 MG/ML IV SOLN
INTRAVENOUS | Status: AC
  Filled 2017-09-11: qty 4

## 2017-09-11 MED ORDER — SCOPOLAMINE 1 MG/3DAYS TD PT72
MEDICATED_PATCH | TRANSDERMAL | Status: AC
  Filled 2017-09-11: qty 1

## 2017-09-11 MED ORDER — BUPIVACAINE HCL (PF) 0.25 % IJ SOLN
INTRAMUSCULAR | Status: DC | PRN
Start: 2017-09-11 — End: 2017-09-11
  Administered 2017-09-11: 10 mL

## 2017-09-11 MED ORDER — FENTANYL CITRATE (PF) 250 MCG/5ML IJ SOLN
INTRAMUSCULAR | Status: AC
  Filled 2017-09-11: qty 5

## 2017-09-11 MED ORDER — FENTANYL CITRATE (PF) 100 MCG/2ML IJ SOLN
25.0000 ug | INTRAMUSCULAR | Status: DC | PRN
  Administered 2017-09-11 (×2): 50 ug via INTRAVENOUS
  Filled 2017-09-11: qty 1

## 2017-09-11 MED ORDER — OXYCODONE HCL 5 MG PO TABS
ORAL_TABLET | ORAL | Status: AC
  Filled 2017-09-11: qty 1

## 2017-09-11 MED ORDER — MIDAZOLAM HCL 2 MG/2ML IJ SOLN
INTRAMUSCULAR | Status: DC | PRN
  Administered 2017-09-11: 2 mg via INTRAVENOUS

## 2017-09-11 MED ORDER — PROMETHAZINE HCL 25 MG/ML IJ SOLN
6.2500 mg | INTRAMUSCULAR | Status: DC | PRN
  Filled 2017-09-11: qty 1

## 2017-09-11 MED ORDER — CEFAZOLIN SODIUM-DEXTROSE 1-4 GM/50ML-% IV SOLN
1.0000 g | Freq: Three times a day (TID) | INTRAVENOUS | Status: AC
  Administered 2017-09-11 – 2017-09-12 (×2): 1 g via INTRAVENOUS
  Filled 2017-09-11: qty 50

## 2017-09-11 MED ORDER — DEXAMETHASONE SODIUM PHOSPHATE 10 MG/ML IJ SOLN
INTRAMUSCULAR | Status: DC | PRN
  Administered 2017-09-11: 10 mg via INTRAVENOUS

## 2017-09-11 MED ORDER — SODIUM CHLORIDE 0.9 % IV SOLN
INTRAVENOUS | Status: DC | PRN
  Administered 2017-09-11: 500 mL

## 2017-09-11 SURGICAL SUPPLY — 61 items
ADH SKN CLS APL DERMABOND .7 (GAUZE/BANDAGES/DRESSINGS) ×1
AMS 700ACCESSORY KIT ×2 IMPLANT
APL SKNCLS STERI-STRIP NONHPOA (GAUZE/BANDAGES/DRESSINGS)
BAG DECANTER FOR FLEXI CONT (MISCELLANEOUS) ×3 IMPLANT
BAG URINE DRAINAGE (UROLOGICAL SUPPLIES) ×3 IMPLANT
BANDAGE COBAN STERILE 2 (GAUZE/BANDAGES/DRESSINGS) IMPLANT
BENZOIN TINCTURE PRP APPL 2/3 (GAUZE/BANDAGES/DRESSINGS) IMPLANT
BLADE HEX COATED 2.75 (ELECTRODE) ×3 IMPLANT
BLADE SURG 15 STRL LF DISP TIS (BLADE) ×1 IMPLANT
BLADE SURG 15 STRL SS (BLADE) ×6
BNDG GAUZE ELAST 4 BULKY (GAUZE/BANDAGES/DRESSINGS) ×2 IMPLANT
CATH FOLEY 2WAY SLVR  5CC 16FR (CATHETERS) ×2
CATH FOLEY 2WAY SLVR 5CC 16FR (CATHETERS) ×1 IMPLANT
CLOSURE WOUND 1/2 X4 (GAUZE/BANDAGES/DRESSINGS)
COVER MAYO STAND STRL (DRAPES) ×6 IMPLANT
COVER SURGICAL LIGHT HANDLE (MISCELLANEOUS) ×1 IMPLANT
COVER TABLE BACK 60X90 (DRAPES) ×3 IMPLANT
DERMABOND ADVANCED (GAUZE/BANDAGES/DRESSINGS) ×2
DERMABOND ADVANCED .7 DNX12 (GAUZE/BANDAGES/DRESSINGS) ×1 IMPLANT
DISSECTOR ROUND CHERRY 3/8 STR (MISCELLANEOUS) ×1 IMPLANT
DRAPE INCISE IOBAN 66X45 STRL (DRAPES) ×3 IMPLANT
DRAPE LAPAROTOMY 100X72 PEDS (DRAPES) ×3 IMPLANT
DRSG TEGADERM 4X4.75 (GAUZE/BANDAGES/DRESSINGS) IMPLANT
EVACUATOR DRAINAGE 7X20 100CC (MISCELLANEOUS) IMPLANT
EVACUATOR SILICONE 100CC (MISCELLANEOUS) ×3
GAUZE SPONGE 4X4 12PLY STRL (GAUZE/BANDAGES/DRESSINGS) IMPLANT
GAUZE SPONGE 4X4 16PLY XRAY LF (GAUZE/BANDAGES/DRESSINGS) ×4 IMPLANT
GAUZE XEROFORM 1X8 LF (GAUZE/BANDAGES/DRESSINGS) ×2 IMPLANT
GLOVE BIO SURGEON STRL SZ7.5 (GLOVE) ×18 IMPLANT
GLOVE BIOGEL PI IND STRL 7.5 (GLOVE) IMPLANT
GLOVE BIOGEL PI INDICATOR 7.5 (GLOVE) ×10
GOWN STRL REUS W/TWL LRG LVL3 (GOWN DISPOSABLE) ×5 IMPLANT
IMPL RTE SNAPCONE CX 3.0 (Breast) IMPLANT
IMPLANT RTE SNAPCONE CX 3.0 (Breast) ×3 IMPLANT
KIT ACCESSORY AMS 700 PUMP (UROLOGICAL SUPPLIES) ×2 IMPLANT
NEEDLE HYPO 22GX1.5 SAFETY (NEEDLE) ×3 IMPLANT
NS IRRIG 1000ML POUR BTL (IV SOLUTION) ×3 IMPLANT
PACK BASIN DAY SURGERY FS (CUSTOM PROCEDURE TRAY) ×3 IMPLANT
PENCIL BUTTON HOLSTER BLD 10FT (ELECTRODE) ×3 IMPLANT
PLUG CATH AND CAP STER (CATHETERS) ×3 IMPLANT
PUMP PRECONNECT MS 18 LGX (Miscellaneous) IMPLANT
PUMP PRECONNECT MS 18CM LGX (Miscellaneous) ×3 IMPLANT
RESERVOIR PENILE 100ML (Miscellaneous) ×2 IMPLANT
RETRACTOR WILSON SYSTEM (INSTRUMENTS) ×3 IMPLANT
SOL PREP PROV IODINE SCRUB 4OZ (MISCELLANEOUS) IMPLANT
SPONGE LAP 4X18 X RAY DECT (DISPOSABLE) IMPLANT
STRIP CLOSURE SKIN 1/2X4 (GAUZE/BANDAGES/DRESSINGS) ×1 IMPLANT
SUPPORT SCROTAL LG STRP (MISCELLANEOUS) ×2 IMPLANT
SUPPORTER ATHLETIC LG (MISCELLANEOUS) ×1
SUT MNCRL AB 4-0 PS2 18 (SUTURE) ×2 IMPLANT
SUT VIC AB 2-0 SH 18 (SUTURE) ×4 IMPLANT
SUT VIC AB 2-0 UR5 27 (SUTURE) ×6 IMPLANT
SUT VIC AB 4-0 PS2 18 (SUTURE) ×4 IMPLANT
SYR 20CC LL (SYRINGE) ×3 IMPLANT
SYR 50ML LL SCALE MARK (SYRINGE) ×6 IMPLANT
SYR BULB IRRIGATION 50ML (SYRINGE) ×3 IMPLANT
TOWEL OR 17X24 6PK STRL BLUE (TOWEL DISPOSABLE) ×8 IMPLANT
TUBE CONNECTING 12'X1/4 (SUCTIONS) ×1
TUBE CONNECTING 12X1/4 (SUCTIONS) ×2 IMPLANT
WATER STERILE IRR 1000ML POUR (IV SOLUTION) ×9 IMPLANT
YANKAUER SUCT BULB TIP NO VENT (SUCTIONS) ×5 IMPLANT

## 2017-09-11 NOTE — Op Note (Signed)
Operative Note  Preoperative diagnosis:  1.  Erectile dysfunction  Postoperative diagnosis: 1.  Erectile dysfunction  Procedure(s): 1.  Insertion of AMS inflatable penile prosthesis  Surgeon: Link Snuffer, MD  Assistants: Maud Deed, MD, resident  Anesthesia: General  Complications: None immediate  EBL: 100 cc  Specimens: 1.  None  Drains/Catheters: 1.  Foley catheter 2.  JP drain  Intraoperative findings: Corporal length was the following: Right proximal 9 cm, right distal 12cm, left proximal 10 cm, left distal 11 cm.  Therefore a 18 cm +3 cm rear tip extender was placed.  The reservoir was placed in the right retropubic space.  Indication: 66 year old male with erectile dysfunction refractory to medical management presents for inflatable penile prosthesis.  Thorough discussion was made with the patient regarding the potential risk including but not limited to bleeding, infection, injury to surrounding structures, and the fact that infection can be devastating and require device removal.  He understands these risks and wishes to proceed.  Description of procedure:  The patient was identified and consent was obtained.  The patient was taken to the operating room and placed in the supine position.  The patient was placed under general anesthesia.  Perioperative antibiotics were administered. Patient was prepped and draped in a standard sterile fashion and a timeout was performed.  A Foley catheter was placed.  A 4 cm transverse penoscrotal incision was made with Bovie electrocautery on cut setting.  Blunt dissection clear tissue away from the corpora bilaterally.  Sharp dissection was used to carefully take down the septum taking great care to avoid the urethra.  Ring retractor was used to assist with retraction.  Additional sharp dissection was performed bilaterally to expose the corpora.  Spot electrocautery was used for hemostasis.  2 separate 2-0 Vicryl sutures were placed on  each side in the proximal corpora.  An incision was made in between each of these bilaterally to make our corporotomy.  First Metzenbaum scissors were used to carefully dilate proximally and distally on each side.  Brooks dilators were then used from a size of 11-12 to carefully dilate proximally and distally.  There was minimal resistance with dilation.  We then proceeded with measurement with the measurements noted above.  We irrigated the corpora with antibiotic solution and there was no evidence of any urethral injury.  We inserted the [facility] dilators proximally to confirm there was no crossover and also did this distally as well.  There was no evidence of any crossover.  We thoroughly irrigated the wound.  We bluntly developed the space for a reservoir piercing the transversalis fascia on the right bluntly and gained access to the retropubic space. 0.25 percent Marcaine was instilled into the area bilaterally for anesthetic effect.  At this point in time all members of the surgical team changed their over gloves.  Sterile towel was placed inferior to the scrotum.  The device was prepared.  The sizing of the device is noted above.  First, the reservoir was placed in the right retropubic space and 100 cc of normal saline filled the reservoir.  We then used the furlough device to pierce the distal glands with the [facility] needle.  On each side we first inserted the proximal portion of the cylinder into the proximal corpora followed by situation of the distal portion and the distal corpora and it sat nicely within the corpora.  We then test inflated the device and noted it to be in excellent position and it was even on each  side.  There was a slight ST deformity.  There was no significant curvature.  We then secured down the stay sutures to close the corporotomy.  To further close the corporotomy, additional 2-0 Vicryl sutures were carefully placed and secured down to close the corporotomy.  Once bilateral  corporotomies were closed, excess tubing was trimmed and properly connected together.  Copious antibiotic solution was used to irrigate the area.  A dartos pouch on the anterior scrotum was then created and the pump was placed within this.  The dartos tissue was closed with a pursestring stitch to keep the pump in place.  A JP drain was placed with the tubing coming out the right side.  This was secured down with a drain stitch.  The dartos tissue was closed with running 2-0 Vicryl to cover the tubing.  A second layer of dartos was then closed with 2-0 Vicryl in a running fashion to close the incision.  The skin was then closed with running 4-0 Monocryl and Dermabond.  The prosthetic was inflated about 70%.  The scrotum and penis were then wrapped with a mummy wrap dressing.  This concluded the operation.  The patient tolerated the procedure well and was stable postoperatively.  Plan: The patient will be monitored overnight.  His Foley catheter will be removed tomorrow and the prosthesis will be deflated.  After the drain is removed he will be discharged with 7 days of p.o. antibiotic.  He will return in 6 weeks for a postoperative check and to be taught how to use the device.

## 2017-09-11 NOTE — Progress Notes (Signed)
Dr. Gloriann Loan in earlier.  Stated pt's IV can be removed at 0600 if pt desires.

## 2017-09-11 NOTE — H&P (Signed)
CC: I am having trouble with my erections.  HPI: [name] Allen is a 66 year-old male established patient who is here for erectile dysfunction.  He first stated noticing pain on approximately 07/25/2012. His symptoms did begin gradually. His symptoms have been worse over the last year.   He does have difficulties achieving an erection. He does have problems maintaining his erections. His erections are straight. He has tried Viagra. It did not work. He has tried a vacuum erection device. It did not work.   07/25/17  The patient is unable to obtain an erection. He has tried Viagra in the past without success. He has tried the vacuum erection device without success. He is not interested in intracorporeal injections nor is he interested in MUSE.   He is also interested in getting a PSA screening.   He has no voiding complaints.   08/13/17:  The patient presents today to discuss his elevated PSA again. This was repeated by his primary care physician and came back slightly above 4. At a long discussion with him about PSA. I explained that it is slightly elevated but only borderline. After discussion, he does not wish to proceed with biopsy and would like to just watch it for now. He would like to proceed with penile prosthesis placement as discussed before.     ALLERGIES: No Allergies    MEDICATIONS: Levaquin 750 mg tablet 1 tablet PO morning of procedure  Aspirin Ec 81 mg tablet, delayed release Oral  Metoprolol Tartrate 75 mg tablet Oral  Sildenafil Citrate 20 MG Oral Tablet 0 Oral     GU PSH: No GU PSH      PSH Notes: Heart Surgery   NON-GU PSH: No Non-GU PSH    GU PMH: ED due to arterial insufficiency, Erectile dysfunction due to arterial insufficiency - 2016 Primary hypogonadism, Hypogonadism, testicular - 2016 Encounter for Prostate Cancer screening, Prostate cancer screening - 2016 Male ED, unspecified, Organic erectile dysfunction - 2016    NON-GU PMH: Encounter for general adult  medical examination without abnormal findings, Encounter for preventive health examination - 2016 Anxiety, Anxiety - 2016 Personal history of other diseases of the circulatory system, History of cardiac disorder - 2016 Personal history of other mental and behavioral disorders, History of depression - 2016 Personal history of other specified conditions, History of heartburn - 2016    FAMILY HISTORY: Diabetes - Runs In Family kidney disease - Runs In Family   SOCIAL HISTORY: No Social History     Notes: Caffeine use, Single, Father deceased, Retired, Mother deceased, Former smoker, Alcohol use   REVIEW OF SYSTEMS:    GU Review Male:   Patient denies frequent urination, hard to postpone urination, burning/ pain with urination, get up at night to urinate, leakage of urine, stream starts and stops, trouble starting your stream, have to strain to urinate , erection problems, and penile pain.  Gastrointestinal (Upper):   Patient denies indigestion/ heartburn, nausea, and vomiting.  Gastrointestinal (Lower):   Patient denies diarrhea and constipation.  Constitutional:   Patient denies fever, night sweats, weight loss, and fatigue.  Skin:   Patient denies skin rash/ lesion and itching.  Eyes:   Patient denies blurred vision and double vision.  Ears/ Nose/ Throat:   Patient denies sore throat and sinus problems.  Hematologic/Lymphatic:   Patient denies swollen glands and easy bruising.  Cardiovascular:   Patient denies leg swelling and chest pains.  Respiratory:   Patient denies cough and shortness of breath.  Endocrine:   Patient denies excessive thirst.  Musculoskeletal:   Patient denies back pain and joint pain.  Neurological:   Patient denies headaches and dizziness.  Psychologic:   Patient denies depression and anxiety.   VITAL SIGNS: None   MULTI-SYSTEM PHYSICAL EXAMINATION:    Constitutional: Well-nourished. No physical deformities. Normally developed. Good grooming.  Respiratory: No  labored breathing, no use of accessory muscles.   Cardiovascular: Normal temperature, adequate peripheral perfusion  Skin: No paleness, no jaundice  Neurologic / Psychiatric: Oriented to time, oriented to place, oriented to person. No depression, no anxiety, no agitation.  Gastrointestinal: No mass, no tenderness, no rigidity, non obese abdomen.  Eyes: Normal conjunctivae. Normal eyelids.  Musculoskeletal: Normal gait and station of head and neck.     PAST DATA REVIEWED:  Source Of History:  Patient   07/25/17 05/31/15  PSA  Total PSA 4.18 ng/mL 3.40     10/26/15 06/01/15  Hormones  Testosterone, Total 565  248     PROCEDURES: None   ASSESSMENT:      ICD-10 Details  1 GU:   ED due to arterial insufficiency - N52.01   2   Elevated PSA - R97.20    PLAN:           Document Letter(s):  Created for Patient: Clinical Summary         Notes:   After extensive discussion, he would like to hold off on prostate biopsy. He would like to proceed with penile prosthesis placement.   The patient has elected to undergo placement of a three-piece inflatable penile prosthesis. He understands that this is an invasive surgical option for erectile dysfunction and will result in permanent inability to achieve natural erections. He understands the potential complications of the surgery including bleeding, pain, ST deformity, injury to surrounding structures such as urethral injury, erosion of the device, as well as the potentially devastating complication of infection that would require complete removal of the entire prosthesis. He also understands that the typical lifespan of a prosthetic is 5-10 years (but may last shorter or longer) and if it becomes nonfunctional then it will require replacement. He understands that the length of his erection will be approximately equal to stretched penile length and that the most common complaint among men after this surgery is the appearance of a smaller erection.  We discussed that there is an overall high satisfaction rate upwards of 80-90% among men who undergo the surgery. He is eager to proceed.      Signed by Link Snuffer, III, M.D. on 08/13/17 at 2:39 PM (EST

## 2017-09-11 NOTE — Anesthesia Preprocedure Evaluation (Addendum)
Anesthesia Evaluation  Patient identified by MRN, date of birth, ID band Patient awake    Reviewed: Allergy & Precautions, NPO status , Patient's Chart, lab work & pertinent test results  Airway Mallampati: II  TM Distance: >3 FB Neck ROM: Full    Dental  (+) Teeth Intact, Dental Advisory Given, Upper Dentures, Partial Lower   Pulmonary neg pulmonary ROS, former smoker,    Pulmonary exam normal        Cardiovascular Exercise Tolerance: Good hypertension, Pt. on medications + CAD, + Past MI and + Cardiac Stents  Normal cardiovascular exam  Overall Impression:  Normal stress nuclear study.  LV Ejection Fraction: 58%.  LV Wall Motion:  NL LV Function; NL Wall Motion    Neuro/Psych PSYCHIATRIC DISORDERS Anxiety Depression negative neurological ROS     GI/Hepatic negative GI ROS, Neg liver ROS,   Endo/Other  negative endocrine ROS  Renal/GU negative Renal ROS     Musculoskeletal negative musculoskeletal ROS (+)   Abdominal   Peds  Hematology negative hematology ROS (+)   Anesthesia Other Findings   Reproductive/Obstetrics negative OB ROS                         Anesthesia Physical Anesthesia Plan  ASA: III  Anesthesia Plan: General   Post-op Pain Management:    Induction: Intravenous  PONV Risk Score and Plan: 3 and Ondansetron, Dexamethasone and Scopolamine patch - Pre-op  Airway Management Planned: LMA  Additional Equipment:   Intra-op Plan:   Post-operative Plan: Extubation in OR  Informed Consent: I have reviewed the patients History and Physical, chart, labs and discussed the procedure including the risks, benefits and alternatives for the proposed anesthesia with the patient or authorized representative who has indicated his/her understanding and acceptance.   Dental advisory given  Plan Discussed with: CRNA and Anesthesiologist  Anesthesia Plan Comments:          Anesthesia Quick Evaluation

## 2017-09-11 NOTE — Interval H&P Note (Signed)
History and Physical Interval Note:  09/11/2017 8:30 AM  [name] Allen  has presented today for surgery, with the diagnosis of ERECTILE DYSFUNCTION  The various methods of treatment have been discussed with the patient and family. After consideration of risks, benefits and other options for treatment, the patient has consented to  Procedure(s): [place] (N/A) as a surgical intervention .  The patient's history has been reviewed, patient examined, no change in status, stable for surgery.  I have reviewed the patient's chart and labs.  Questions were answered to the patient's satisfaction.     Marton Redwood, III

## 2017-09-11 NOTE — Anesthesia Postprocedure Evaluation (Signed)
Anesthesia Post Note  Patient: [name] Allen  Procedure(s) Performed: PENILE PROTHESIS INFLATABLE AMS (N/A Scrotum)     Patient location during evaluation: PACU Anesthesia Type: General Level of consciousness: sedated Pain management: pain level controlled Vital Signs Assessment: post-procedure vital signs reviewed and stable Respiratory status: spontaneous breathing and respiratory function stable Cardiovascular status: stable Postop Assessment: no apparent nausea or vomiting Anesthetic complications: no    Last Vitals:  Vitals:   09/11/17 1300 09/11/17 1329  BP: 139/72 (!) 141/84  Pulse: 60 68  Resp: 15 16  Temp: 36.8 C 36.8 C  SpO2: 97% 98%    Last Pain:  Vitals:   09/11/17 1245  TempSrc:   PainSc: 0-No pain                 Errika Narvaiz,Esai DANIEL

## 2017-09-11 NOTE — Anesthesia Procedure Notes (Signed)
Procedure Name: Intubation Date/Time: 09/11/2017 8:46 AM Performed by: Wanita Chamberlain, CRNA Pre-anesthesia Checklist: Patient identified, Emergency Drugs available, Suction available, Patient being monitored and Timeout performed Patient Re-evaluated:Patient Re-evaluated prior to induction Oxygen Delivery Method: Circle system utilized Preoxygenation: Pre-oxygenation with 100% oxygen Induction Type: IV induction Ventilation: Mask ventilation without difficulty Laryngoscope Size: Mac and 3 Grade View: Grade I Tube type: Oral Tube size: 7.5 mm Number of attempts: 1 Airway Equipment and Method: Stylet Placement Confirmation: ETT inserted through vocal cords under direct vision,  positive ETCO2 and breath sounds checked- equal and bilateral Secured at: 22 cm Tube secured with: Tape Dental Injury: Teeth and Oropharynx as per pre-operative assessment

## 2017-09-11 NOTE — Transfer of Care (Signed)
Immediate Anesthesia Transfer of Care Note  Patient: [name] Allen  Procedure(s) Performed: PENILE PROTHESIS INFLATABLE AMS (N/A Scrotum)  Patient Location: PACU  Anesthesia Type:General  Level of Consciousness: awake, alert , oriented and patient cooperative  Airway & Oxygen Therapy: Patient Spontanous Breathing and Patient connected to nasal cannula oxygen  Post-op Assessment: Report given to RN and Post -op Vital signs reviewed and stable  Post vital signs: Reviewed and stable  Last Vitals:  Vitals:   09/11/17 0627  BP: (!) 146/80  Pulse: (!) 59  Resp: 16  Temp: 36.9 C  SpO2: 100%    Last Pain:  Vitals:   09/11/17 0627  TempSrc: Oral         Complications: No apparent anesthesia complications

## 2017-09-12 ENCOUNTER — Telehealth: Payer: Self-pay | Admitting: *Deleted

## 2017-09-12 ENCOUNTER — Encounter (HOSPITAL_BASED_OUTPATIENT_CLINIC_OR_DEPARTMENT_OTHER): Payer: Self-pay | Admitting: Urology

## 2017-09-12 DIAGNOSIS — N5201 Erectile dysfunction due to arterial insufficiency: Secondary | ICD-10-CM | POA: Diagnosis not present

## 2017-09-12 DIAGNOSIS — Z8719 Personal history of other diseases of the digestive system: Secondary | ICD-10-CM | POA: Diagnosis not present

## 2017-09-12 DIAGNOSIS — Z87891 Personal history of nicotine dependence: Secondary | ICD-10-CM | POA: Diagnosis not present

## 2017-09-12 DIAGNOSIS — Z955 Presence of coronary angioplasty implant and graft: Secondary | ICD-10-CM | POA: Diagnosis not present

## 2017-09-12 DIAGNOSIS — I252 Old myocardial infarction: Secondary | ICD-10-CM | POA: Diagnosis not present

## 2017-09-12 DIAGNOSIS — I10 Essential (primary) hypertension: Secondary | ICD-10-CM | POA: Diagnosis not present

## 2017-09-12 DIAGNOSIS — I2510 Atherosclerotic heart disease of native coronary artery without angina pectoris: Secondary | ICD-10-CM | POA: Diagnosis not present

## 2017-09-12 DIAGNOSIS — Z8679 Personal history of other diseases of the circulatory system: Secondary | ICD-10-CM | POA: Diagnosis not present

## 2017-09-12 DIAGNOSIS — E291 Testicular hypofunction: Secondary | ICD-10-CM | POA: Diagnosis not present

## 2017-09-12 DIAGNOSIS — R972 Elevated prostate specific antigen [PSA]: Secondary | ICD-10-CM | POA: Diagnosis not present

## 2017-09-12 DIAGNOSIS — Z79899 Other long term (current) drug therapy: Secondary | ICD-10-CM | POA: Diagnosis not present

## 2017-09-12 MED ORDER — ACETAMINOPHEN 500 MG PO TABS
ORAL_TABLET | ORAL | Status: AC
  Filled 2017-09-12: qty 2

## 2017-09-12 MED ORDER — OXYCODONE HCL 5 MG PO TABS
ORAL_TABLET | ORAL | Status: AC
  Filled 2017-09-12: qty 1

## 2017-09-12 MED ORDER — CEFAZOLIN SODIUM-DEXTROSE 1-4 GM/50ML-% IV SOLN
INTRAVENOUS | Status: AC
  Filled 2017-09-12: qty 50

## 2017-09-12 NOTE — Discharge Summary (Signed)
[facility] Discharge Summary  Admit date: 09/11/2017  Discharge date and time: 09/12/17   Discharge to: Home  Discharge Service: Urology  Discharge Attending Physician:  Gloriann Loan  Discharge  Diagnoses: <principal problem not specified>  Secondary Diagnosis: Active Problems:   Erectile dysfunction   OR Procedures: Procedure(s): PENILE PROTHESIS INFLATABLE AMS 09/11/2017   Ancillary Procedures: None   Discharge Day Services: The patient was seen and examined by the Urology team both in the morning and immediately prior to discharge.  Vital signs and laboratory values were stable and within normal limits.  The physical exam was benign and unchanged and all surgical wounds were examined.  Discharge instructions were explained and all questions answered.  Subjective  No acute events overnight. Pain Controlled. No fever or chills.  Objective Patient Vitals for the past 8 hrs:  BP Temp Temp src Pulse Resp SpO2  09/12/17 0200 (!) 116/54 98.5 F (36.9 C) Oral (!) 58 16 97 %   No intake/output data recorded.  General Appearance:        No acute distress Lungs:                       Normal work of breathing on room air Heart:                                Regular rate and rhythm Abdomen:                         Soft, non-tender, non-distended GU   Prosthesis semi inflated, no scrotal edema, erythema or ecchymosis. Pump in good position, incision c/d/i. JP removed Extremities:                      Warm and well perfused   Hospital Course:  The patient underwent IPP on 09/11/2017.  The patient tolerated the procedure well, was extubated in the OR, and afterwards was taken to the PACU for routine post-surgical care. When stable the patient was transferred to the recovery area.   The patient did well postoperatively.  The patient's diet was slowly advanced and at the time of discharge was tolerating a regular diet.  The patient was discharged home 1 Day Post-Op, at which point was  tolerating a regular solid diet, was able to void spontaneously, have adequate pain control with P.O. pain medication, and could ambulate without difficulty. The patient will follow up with [place] for post op check.   JP removed prior to DC Send home with 7 days of bactrim   Condition at Discharge: Improved  Discharge Medications:  Allergies as of 09/12/2017   No Known Allergies     Medication List    TAKE these medications   aspirin 81 MG tablet Take 81 mg by mouth daily.   atorvastatin 40 MG tablet Commonly known as:  LIPITOR Take 1 tablet (40 mg total) by mouth daily.   cyclobenzaprine 5 MG tablet Commonly known as:  FLEXERIL Take 1 tablet (5 mg total) by mouth 2 (two) times daily as needed for muscle spasms.   HYDROcodone-acetaminophen 5-325 MG tablet Commonly known as:  NORCO/VICODIN Take 1-2 tablets by mouth every 6 (six) hours as needed for severe pain.   lisinopril 5 MG tablet Commonly known as:  PRINIVIL,ZESTRIL Take 5 mg by mouth daily.   meloxicam 15 MG tablet Commonly known as:  MOBIC Take  1 tablet (15 mg total) by mouth daily as needed for pain.   pantoprazole 40 MG tablet Commonly known as:  PROTONIX Take 1 tablet (40 mg total) daily by mouth.   sulfamethoxazole-trimethoprim 800-160 MG tablet Commonly known as:  BACTRIM DS,SEPTRA DS Take 1 tablet by mouth 2 (two) times daily for 7 days.   traZODone 50 MG tablet Commonly known as:  DESYREL Take 1 tablet (50 mg total) at bedtime by mouth.

## 2017-09-12 NOTE — Discharge Instructions (Signed)
Penile prosthesis postoperative instructions  Wound:  In most cases your incision will have absorbable sutures that will dissolve within the first 10-20 days. Some will fall out even earlier. Expect some redness as the sutures dissolved but this should occur only around the sutures. If there is generalized redness, especially with increasing pain or swelling, let us know. The scrotum and penis will very likely get "black and blue" as the blood in the tissues spread. Sometimes the whole scrotum will turn colors. The black and blue is followed by a yellow and brown color. In time, all the discoloration will go away. In some cases some firm swelling in the area of the testicle and pump may persist for up to 4-6 weeks after the surgery and is considered normal in most cases.  Diet:  You may return to your normal diet within 24 hours following your surgery. You may note some mild nausea and possibly vomiting the first 6-8 hours following surgery. This is usually due to the side effects of anesthesia, and will disappear quite soon. I would suggest clear liquids and a very light meal the first evening following your surgery.  Activity:  Your physical activity should be restricted the first 48 hours. During that time you should remain relatively inactive, moving about only when necessary. During the first 7-10 days following surgery he should avoid lifting any heavy objects (anything greater than 15 pounds), and avoid strenuous exercise. If you work, ask [place] specifically about your restrictions, both for work and home. We will write a note to your employer if needed.  You should plan to wear a tight pair of jockey shorts or an athletic supporter for the first 4-5 days, even to sleep. This will keep the scrotum immobilized to some degree and keep the swelling down.The position of your penis will determine what is most comfortable but I strongly urge you to keep the penis in the "up" position (toward your  head).  Ice packs should be placed on and off over the scrotum for the first 48 hours. Frozen peas or corn in a ZipLock bag can be frozen, used and re-frozen. Fifteen minutes on and 15 minutes off is a reasonable schedule. The ice is a good pain reliever and keeps the swelling down.  Hygiene:  You may shower 48 hours after your surgery. Tub bathing should be restricted until the seventh day.  Medication:  You will be sent home with some type of pain medication. In many cases you will be sent home with a narcotic pain pill (hydrocodone or oxycodone). If the pain is not too bad, you may take either Tylenol (acetaminophen) or Advil (ibuprofen) which contain no narcotic agents, and might be tolerated a little better, with fewer side effects. If the pain medication you are sent home with does not control the pain, you will have to let us know. Some narcotic pain medications cannot be given or refilled by a phone call to a pharmacy.  Problems you should report to [place]:   Fever of 101.0 degrees Fahrenheit or greater.  Moderate or severe swelling under the skin incision or involving the scrotum. Drug reaction such as hives, a rash, nausea or vomiting.   Finish the prescribed antibiotics

## 2017-09-12 NOTE — Telephone Encounter (Signed)
Pt was on TCM report admitted 09/11/17, and underwent IPP on 09/11/2017.  The patient tolerated the procedure well, was extubated in the OR, and afterwards was taken to the PACU for routine post-surgical care. Pt D/C 09/12/17 and will f/u w/urologist 7-10 days...Johny Chess

## 2017-10-02 ENCOUNTER — Other Ambulatory Visit: Payer: Self-pay | Admitting: Internal Medicine

## 2017-10-02 ENCOUNTER — Encounter: Payer: Self-pay | Admitting: Internal Medicine

## 2017-10-02 ENCOUNTER — Telehealth: Payer: Self-pay

## 2017-10-02 DIAGNOSIS — R195 Other fecal abnormalities: Secondary | ICD-10-CM

## 2017-10-02 NOTE — Telephone Encounter (Signed)
Positive cologuard result, patient notified referral placed to GI

## 2017-10-02 NOTE — Progress Notes (Unsigned)
Abstracted and sent to scan  

## 2017-10-02 NOTE — Telephone Encounter (Signed)
Copied from [place] [id number]. Topic: Inquiry >> Oct 01, 2017  3:19 PM Scherrie Gerlach wrote: Reason for CRM: [employer] wants to make sure you received abnormal colo guard result on pt

## 2017-10-02 NOTE — Telephone Encounter (Signed)
Yes received the results, patient notified and Referral to GI is placed

## 2017-10-03 ENCOUNTER — Encounter: Payer: Self-pay | Admitting: Gastroenterology

## 2017-11-08 ENCOUNTER — Ambulatory Visit (AMBULATORY_SURGERY_CENTER): Payer: Self-pay

## 2017-11-08 VITALS — Ht 67.0 in | Wt 169.8 lb

## 2017-11-08 DIAGNOSIS — Z86010 Personal history of colonic polyps: Secondary | ICD-10-CM

## 2017-11-08 DIAGNOSIS — R195 Other fecal abnormalities: Secondary | ICD-10-CM

## 2017-11-08 MED ORDER — NA SULFATE-K SULFATE-MG SULF 17.5-3.13-1.6 GM/177ML PO SOLN: 1.0000 | Freq: Once | ORAL | 0 refills | Status: AC

## 2017-11-08 NOTE — Progress Notes (Signed)
Per pt, no allergies to soy or egg products.Pt not taking any weight loss meds or using  O2 at home.  Pt refused emmi video. 

## 2017-11-22 ENCOUNTER — Other Ambulatory Visit: Payer: Self-pay

## 2017-11-22 ENCOUNTER — Ambulatory Visit (AMBULATORY_SURGERY_CENTER): Payer: Medicare Other | Admitting: Gastroenterology

## 2017-11-22 ENCOUNTER — Encounter: Payer: Self-pay | Admitting: Gastroenterology

## 2017-11-22 VITALS — BP 141/80 | HR 51 | Temp 97.5°F | Resp 15 | Ht 67.0 in | Wt 169.0 lb

## 2017-11-22 DIAGNOSIS — Z86010 Personal history of colonic polyps: Secondary | ICD-10-CM

## 2017-11-22 DIAGNOSIS — D125 Benign neoplasm of sigmoid colon: Secondary | ICD-10-CM

## 2017-11-22 DIAGNOSIS — K6389 Other specified diseases of intestine: Secondary | ICD-10-CM | POA: Diagnosis not present

## 2017-11-22 DIAGNOSIS — R195 Other fecal abnormalities: Secondary | ICD-10-CM

## 2017-11-22 DIAGNOSIS — K635 Polyp of colon: Secondary | ICD-10-CM | POA: Diagnosis not present

## 2017-11-22 DIAGNOSIS — D120 Benign neoplasm of cecum: Secondary | ICD-10-CM

## 2017-11-22 DIAGNOSIS — D123 Benign neoplasm of transverse colon: Secondary | ICD-10-CM

## 2017-11-22 MED ORDER — SODIUM CHLORIDE 0.9 % IV SOLN: 500.0000 mL | Freq: Once | INTRAVENOUS | Status: DC

## 2017-11-22 MED ORDER — PANTOPRAZOLE SODIUM 40 MG PO TBEC: 40.0000 mg | DELAYED_RELEASE_TABLET | Freq: Every day | ORAL | 3 refills | Status: DC

## 2017-11-22 NOTE — Progress Notes (Signed)
Called to room to assist during endoscopic procedure.  Patient ID and intended procedure confirmed with present staff. Received instructions for my participation in the procedure from the performing physician.  

## 2017-11-22 NOTE — Progress Notes (Signed)
To recovery, report to RN, VSS. 

## 2017-11-22 NOTE — Progress Notes (Signed)
Pt's states no medical or surgical changes since previsit or office visit. 

## 2017-11-22 NOTE — Patient Instructions (Signed)
YOU HAD AN ENDOSCOPIC PROCEDURE TODAY AT [facility]:   Refer to the procedure report that was given to you for any specific questions about what was found during the examination.  If the procedure report does not answer your questions, please call your gastroenterologist to clarify.  If you requested that your care partner not be given the details of your procedure findings, then the procedure report has been included in a sealed envelope for you to review at your convenience later.  YOU SHOULD EXPECT: Some feelings of bloating in the abdomen. Passage of more gas than usual.  Walking can help get rid of the air that was put into your GI tract during the procedure and reduce the bloating. If you had a lower endoscopy (such as a colonoscopy or flexible sigmoidoscopy) you may notice spotting of blood in your stool or on the toilet paper. If you underwent a bowel prep for your procedure, you may not have a normal bowel movement for a few days.  Please Note:  You might notice some irritation and congestion in your nose or some drainage.  This is from the oxygen used during your procedure.  There is no need for concern and it should clear up in a day or so.  SYMPTOMS TO REPORT IMMEDIATELY:   Following lower endoscopy (colonoscopy or flexible sigmoidoscopy):  Excessive amounts of blood in the stool  Significant tenderness or worsening of abdominal pains  Swelling of the abdomen that is new, acute  Fever of 100F or higher  Please see handouts given to you by your recovery nurse on polyps.  For urgent or emergent issues, a gastroenterologist can be reached at any hour by calling [phone].   DIET:  We do recommend a small meal at first, but then you may proceed to your regular diet.  Drink plenty of fluids but you should avoid alcoholic beverages for 24 hours.  ACTIVITY:  You should plan to take it easy for the rest of today and you should NOT DRIVE or use heavy machinery until  tomorrow (because of the sedation medicines used during the test).    FOLLOW UP: Our staff will call the number listed on your records the next business day following your procedure to check on you and address any questions or concerns that you may have regarding the information given to you following your procedure. If we do not reach you, we will leave a message.  However, if you are feeling well and you are not experiencing any problems, there is no need to return our call.  We will assume that you have returned to your regular daily activities without incident.  If any biopsies were taken you will be contacted by phone or by letter within the next 1-3 weeks.  Please call us at [phone] if you have not heard about the biopsies in 3 weeks.    SIGNATURES/CONFIDENTIALITY: You and/or your care partner have signed paperwork which will be entered into your electronic medical record.  These signatures attest to the fact that that the information above on your After Visit Summary has been reviewed and is understood.  Full responsibility of the confidentiality of this discharge information lies with you and/or your care-partner.  Thank you for letting us take care of your healthcare needs today.

## 2017-11-22 NOTE — Op Note (Signed)
[place] Patient Name: [name] Allen Procedure Date: 11/22/2017 11:12 AM MRN: [id number] Endoscopist: Remo Lipps P. Tonji Elliff MD, MD Age: 67 Referring MD:  Date of Birth: 10-22-1950 Gender: Male Account #: [id number] Procedure:                Colonoscopy Indications:              Surveillance: Personal history of adenomatous                            polyps on last colonoscopy (last colonoscopy 2003) Medicines:                Monitored Anesthesia Care Procedure:                Pre-Anesthesia Assessment:                           - Prior to the procedure, a History and Physical                            was performed, and patient medications and                            allergies were reviewed. The patient's tolerance of                            previous anesthesia was also reviewed. The risks                            and benefits of the procedure and the sedation                            options and risks were discussed with the patient.                            All questions were answered, and informed consent                            was obtained. Prior Anticoagulants: The patient has                            taken no previous anticoagulant or antiplatelet                            agents. ASA Grade Assessment: III - A patient with                            severe systemic disease. After reviewing the risks                            and benefits, the patient was deemed in                            satisfactory condition to undergo the procedure.  After obtaining informed consent, the colonoscope                            was passed under direct vision. Throughout the                            procedure, the patient's blood pressure, pulse, and                            oxygen saturations were monitored continuously. The                            Colonoscope was introduced through the anus and                            advanced  to the the cecum, identified by                            appendiceal orifice and ileocecal valve. The                            colonoscopy was performed without difficulty. The                            patient tolerated the procedure well. The quality                            of the bowel preparation was good. The ileocecal                            valve, appendiceal orifice, and rectum were                            photographed. Scope In: 11:21:19 AM Scope Out: 11:40:08 AM Scope Withdrawal Time: 0 hours 13 minutes 35 seconds  Total Procedure Duration: 0 hours 18 minutes 49 seconds  Findings:                 The perianal and digital rectal examinations were                            normal.                           A diminutive polyp was found in the cecum. The                            polyp was sessile. The polyp was removed with a                            cold biopsy forceps. Resection and retrieval were                            complete.  A 5 mm polyp was found in the transverse colon. The                            polyp was flat. The polyp was removed with a cold                            snare. Resection and retrieval were complete.                           Multiple benign appearing flat polyps, suspected                            hyperplastic, were found in the sigmoid colon. Two                            representative polyps, 4 to 5 mm in size, were                            removed with a cold snare. Resection and retrieval                            were complete.                           The colon was tortuous.                           The exam was otherwise without abnormality on                            direct and retroflexion views. Complications:            No immediate complications. Estimated blood loss:                            Minimal. Estimated Blood Loss:     Estimated blood loss was minimal. Impression:                - One diminutive polyp in the cecum, removed with a                            cold biopsy forceps. Resected and retrieved.                           - One 5 mm polyp in the transverse colon, removed                            with a cold snare. Resected and retrieved.                           - Mulitple suspected benign left sided hyperplastic                            polyps. Two representative polyps in the sigmoid  colon removed with a cold snare. Resected and                            retrieved.                           - Tortuous colon.                           - The examination was otherwise normal on direct                            and retroflexion views. Recommendation:           - Patient has a contact number available for                            emergencies. The signs and symptoms of potential                            delayed complications were discussed with the                            patient. Return to normal activities tomorrow.                            Written discharge instructions were provided to the                            patient.                           - Resume previous diet.                           - Continue present medications.                           - Await pathology results.                           - Repeat colonoscopy is recommended for                            surveillance. The colonoscopy date will be                            determined after pathology results from today's                            exam become available for review. Remo Lipps P. Sheehan Stacey MD, MD 11/22/2017 11:45:29 AM This report has been signed electronically.

## 2017-11-25 ENCOUNTER — Telehealth: Payer: Self-pay

## 2017-11-25 NOTE — Telephone Encounter (Signed)
  Follow up Call-  Call back number 11/22/2017  Post procedure Call Back phone  # [phone] cell  Permission to leave phone message Yes  Some recent data might be hidden     Patient questions:  Do you have a fever, pain , or abdominal swelling? No. Pain Score  0 *  Have you tolerated food without any problems? Yes.    Have you been able to return to your normal activities? Yes.    Do you have any questions about your discharge instructions: Diet   No. Medications  No. Follow up visit  No.  Do you have questions or concerns about your Care? No.  Actions: * If pain score is 4 or above: No action needed, pain <4.

## 2017-11-27 ENCOUNTER — Encounter: Payer: Self-pay | Admitting: Gastroenterology

## 2018-01-28 ENCOUNTER — Other Ambulatory Visit: Payer: Self-pay | Admitting: Internal Medicine

## 2018-02-27 ENCOUNTER — Other Ambulatory Visit: Payer: Self-pay | Admitting: Internal Medicine

## 2018-04-11 ENCOUNTER — Ambulatory Visit (INDEPENDENT_AMBULATORY_CARE_PROVIDER_SITE_OTHER)
Admission: RE | Admit: 2018-04-11 | Discharge: 2018-04-11 | Disposition: A | Discharge status: Home / Self Care | Payer: Medicare Other | Source: Ambulatory Visit | Attending: Internal Medicine | Admitting: Internal Medicine

## 2018-04-11 ENCOUNTER — Ambulatory Visit (INDEPENDENT_AMBULATORY_CARE_PROVIDER_SITE_OTHER): Payer: Medicare Other | Admitting: Internal Medicine

## 2018-04-11 ENCOUNTER — Encounter: Payer: Self-pay | Admitting: Internal Medicine

## 2018-04-11 VITALS — BP 120/90 | HR 55 | Temp 97.8°F | Ht 67.0 in | Wt 160.0 lb

## 2018-04-11 DIAGNOSIS — M19012 Primary osteoarthritis, left shoulder: Secondary | ICD-10-CM | POA: Diagnosis not present

## 2018-04-11 DIAGNOSIS — M25512 Pain in left shoulder: Secondary | ICD-10-CM | POA: Diagnosis not present

## 2018-04-11 DIAGNOSIS — M79641 Pain in right hand: Secondary | ICD-10-CM | POA: Diagnosis not present

## 2018-04-11 MED ORDER — METHYLPREDNISOLONE ACETATE 40 MG/ML IJ SUSP
40.0000 mg | Freq: Once | INTRAMUSCULAR | Status: AC
Start: 2018-04-11 — End: 2018-04-11
  Administered 2018-04-11: 40 mg via INTRAMUSCULAR

## 2018-04-11 NOTE — Assessment & Plan Note (Signed)
Depo-medrol 40 mg im given at visit. X-ray ordered to check for arthritis. If no improvement may need sports medicine or PT referral.

## 2018-04-11 NOTE — Assessment & Plan Note (Signed)
X-ray ordered right hand to check for arthritis. 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 Depo-medrol 40 mg IM given at visit. Advised ice or tylenol over the counter for pain also.

## 2018-04-11 NOTE — Progress Notes (Signed)
   Subjective:    Patient ID: [name] Allen, male    DOB: [birth year], 67 y.o.   MRN: [id number]  HPI The patient is a 67 YO man coming in for left shoulder pain (started 1-2 weeks ago, pain 5/10, taking ibuprofen without relief, denies injury or overuse, no old injury to the shoulder, some limitation of ROM, overall stable since onset, denies numbness or weakness in the arm) and right hand pain (pain in the right thumb mostly, hurts when he does more, pain 5/10 pain, taking ibuprofen without relief, going on 3-4 weeks, stable since onset, no numbness or weakness).   Review of Systems  Constitutional: Positive for activity change. Negative for appetite change, fatigue, fever and unexpected weight change.  HENT: Negative.   Eyes: Negative.   Respiratory: Negative.   Cardiovascular: Negative.   Musculoskeletal: Positive for arthralgias and myalgias. Negative for back pain, gait problem, joint swelling, neck pain and neck stiffness.  Skin: Negative.   Neurological: Negative for syncope, weakness and numbness.      Objective:   Physical Exam  Constitutional: He is oriented to person, place, and time. He appears well-developed and well-nourished.  HENT:  Head: Normocephalic and atraumatic.  Eyes: EOM are normal.  Neck: Normal range of motion.  Cardiovascular: Normal rate and regular rhythm.  Pulmonary/Chest: Effort normal and breath sounds normal. No respiratory distress. He has no wheezes. He has no rales.  Abdominal: Soft. Bowel sounds are normal. He exhibits no distension. There is no tenderness. There is no rebound.  Musculoskeletal: He exhibits tenderness. He exhibits no edema.  Pain in the [facility] joint left shoulder, no radiation, no pain neck or upper scapula, right hand pain at the thumb base without redness or swelling, no pain at the snuff box  Neurological: He is alert and oriented to person, place, and time. Coordination normal.  Skin: Skin is warm and dry.  Psychiatric: He has a  normal mood and affect.   Vitals:   04/11/18 0951  BP: 120/90  Pulse: (!) 55  Temp: 97.8 F (36.6 C)  TempSrc: Oral  SpO2: 95%  Weight: 160 lb (72.6 kg)  Height: 5\' 7"  (1.702 m)      Assessment & Plan:  Depo-medrol 40 mg IM

## 2018-04-11 NOTE — Patient Instructions (Signed)
We have given you a shot today to help the pain.   We are doing x-ray of the hand and the shoulder.

## 2018-04-22 ENCOUNTER — Other Ambulatory Visit: Payer: Self-pay | Admitting: Gastroenterology

## 2018-04-28 ENCOUNTER — Encounter: Payer: Self-pay | Admitting: Internal Medicine

## 2018-04-28 ENCOUNTER — Telehealth: Payer: Self-pay

## 2018-04-28 NOTE — Telephone Encounter (Signed)
Patient wanted to know what he could do for the arthritis that was found in his shoulder and thumb. Also states that tylenol is not working. Patient aware PCP is not back till tomorrow

## 2018-04-28 NOTE — Telephone Encounter (Signed)
Copied from [place] [id number]. Topic: Quick Communication - Other Results >> Apr 28, 2018 10:29 AM [name] Allen wrote: Pt called to have image resutls explained; contact pt to advise

## 2018-04-29 NOTE — Telephone Encounter (Signed)
Called patient back and informed of MD response patient scheduled with Dr. Raeford Razor on Thursday at 10:10

## 2018-04-29 NOTE — Telephone Encounter (Signed)
Recommend to see sports medicine at our office or orthopedics (we can place referral for this if he wants).

## 2018-05-01 ENCOUNTER — Encounter: Payer: Self-pay | Admitting: Family Medicine

## 2018-05-01 ENCOUNTER — Ambulatory Visit (INDEPENDENT_AMBULATORY_CARE_PROVIDER_SITE_OTHER): Payer: Medicare Other | Admitting: Family Medicine

## 2018-05-01 VITALS — BP 132/68 | HR 68 | Ht 67.0 in | Wt 156.0 lb

## 2018-05-01 DIAGNOSIS — M778 Other enthesopathies, not elsewhere classified: Secondary | ICD-10-CM | POA: Diagnosis not present

## 2018-05-01 MED ORDER — NAPROXEN 500 MG PO TABS: 500.0000 mg | ORAL_TABLET | Freq: Two times a day (BID) | ORAL | 0 refills | Status: DC

## 2018-05-01 NOTE — Patient Instructions (Signed)
Nice to meet you  Please try to wear the brace any time you are doing activity  Please try to ice the wrist  Please take the naproxen for 10 days straight and then as needed  Please try the exercise if the pain is less than a 2/10  Please follow up with me in 3-4 weeks if your pain hasn't improved.
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
## 2018-05-02 DIAGNOSIS — G5600 Carpal tunnel syndrome, unspecified upper limb: Secondary | ICD-10-CM | POA: Insufficient documentation

## 2018-05-02 NOTE — Assessment & Plan Note (Signed)
Findings on ultrasound are suggestive of a flexor tendinitis.  X-ray showed no significant arthritis.  [place] had a small spurring but no significant effusion.  Median nerve appeared to be normal. -Provided a cock-up splint. -Scheduled naproxen to take 7 days and then as needed. -Counseled on home exercise therapy. -If no improvement consider physical therapy.

## 2018-05-09 ENCOUNTER — Telehealth: Payer: Self-pay | Admitting: Internal Medicine

## 2018-05-09 NOTE — Telephone Encounter (Signed)
Copied from [place] [phone]. Topic: Quick Communication - See Telephone Encounter >> May 09, 2018  1:05 PM Berneta Levins wrote: CRM for notification. See Telephone encounter for: 05/09/18.  Pt calling in.  States that right wrist is no better.  Pt would like to know if Dr. Raeford Razor has any other recommendations for him.  Current symptoms:  pain in his wrist and hand, pt thinks it may be swollen just a little.  Pt states that he has been taking the medication that was RXd to him Pt can be reached at [phone].

## 2018-05-09 NOTE — Telephone Encounter (Signed)
Spoke with patient-informed him of Dr. Raeford Razor recommendations of injection vs PT. Made appointment for injection.

## 2018-05-09 NOTE — Telephone Encounter (Signed)
Please advise 

## 2018-05-12 ENCOUNTER — Encounter: Payer: Self-pay | Admitting: Family Medicine

## 2018-05-12 ENCOUNTER — Ambulatory Visit (INDEPENDENT_AMBULATORY_CARE_PROVIDER_SITE_OTHER): Payer: Medicare Other

## 2018-05-12 ENCOUNTER — Ambulatory Visit (INDEPENDENT_AMBULATORY_CARE_PROVIDER_SITE_OTHER): Payer: Medicare Other | Admitting: Family Medicine

## 2018-05-12 VITALS — BP 134/76 | HR 76 | Ht 67.0 in | Wt 157.0 lb

## 2018-05-12 DIAGNOSIS — G5601 Carpal tunnel syndrome, right upper limb: Secondary | ICD-10-CM | POA: Diagnosis not present

## 2018-05-12 DIAGNOSIS — M25531 Pain in right wrist: Secondary | ICD-10-CM

## 2018-05-12 DIAGNOSIS — M778 Other enthesopathies, not elsewhere classified: Secondary | ICD-10-CM

## 2018-05-12 NOTE — Progress Notes (Signed)
[name] Allen - 67 y.o. male MRN [id number]  Date of birth: [birth year]  SUBJECTIVE:  Including CC & ROS.  Chief Complaint  Patient presents with  . Wrist injection    [name] Allen is a 67 y.o. male that is here today for right wrist pain.. Patient states the pain has not improved. He has been wearing a cock-up splint daily. Denies changes in his health.  Denies any improvement with the medications.  The pain is worse with flexion.  He feels most of the pain in the first 3 digits on the palmar aspect.  Independent review of the right hand x-ray from 7/9 shows no abnormalities.   Review of Systems  Constitutional: Negative for fever.  HENT: Negative for congestion.   Respiratory: Negative for cough.   Cardiovascular: Negative for chest pain.  Gastrointestinal: Negative for abdominal pain.  Musculoskeletal: Negative for gait problem.  Skin: Negative for color change.  Neurological: Negative for weakness.  Hematological: Negative for adenopathy.  Psychiatric/Behavioral: Negative for agitation.    HISTORY: Past Medical, Surgical, Social, and Family History Reviewed & Updated per EMR.   Pertinent Historical Findings include:  Past Medical History:  Diagnosis Date  . Anxiety   . Coronary artery disease 2003   status post CABG 2005  //     [place] 03/26/12: Ostial LAD 30-40%, mid LAD 20%, circumflex with minor irregularities less than 10%, mid RCA 99%, LIMA-LAD patent but with significant competitive flow from the native LAD, EF 55-65%.  PCI: Evolve 2 study stent to the mid RCA.  . Depression ~ 2005  . Dysrhythmia 2003   irregular  . Ejection fraction    EF 55-65%, catheterization, July, 2013  . HLD (hyperlipidemia)   . Hypertension   . SOB (shortness of breath) on exertion    in the past/ had CABG    Past Surgical History:  Procedure Laterality Date  . CARDIAC SURGERY  2013  . CORONARY ANGIOPLASTY WITH STENT PLACEMENT  03/26/12   "1"  . CORONARY ARTERY BYPASS GRAFT  2003   CABG X1    . INGUINAL HERNIA REPAIR  ~ 2008   right  . LEFT HEART CATHETERIZATION WITH CORONARY/GRAFT ANGIOGRAM  03/26/2012   Procedure: LEFT HEART CATHETERIZATION WITH Beatrix Fetters;  Surgeon: Peter M Martinique, MD;  Location: [facility] CATH LAB;  Service: Cardiovascular;;  . PENILE PROSTHESIS IMPLANT N/A 09/11/2017   Procedure: PENILE PROTHESIS INFLATABLE AMS;  Surgeon: Lucas Mallow, MD;  Location: [facility];  Service: Urology;  Laterality: N/A;  . PERCUTANEOUS CORONARY STENT INTERVENTION (PCI-S)  03/26/2012   Procedure: PERCUTANEOUS CORONARY STENT INTERVENTION (PCI-S);  Surgeon: Peter M Martinique, MD;  Location: [facility] CATH LAB;  Service: Cardiovascular;;    No Known Allergies  Family History  Problem Relation Age of Onset  . Heart disease Mother   . Depression Mother   . Diabetes Mother   . Early death Mother   . Hypertension Mother   . Hyperlipidemia Mother   . Kidney disease Father   . Early death Father   . Cancer Father   . Prostate cancer Father   . Hypertension Sister   . Colon cancer Neg Hx   . Esophageal cancer Neg Hx   . Ovarian cancer Neg Hx   . Pancreatic cancer Neg Hx   . Rectal cancer Neg Hx   . Stomach cancer Neg Hx      Social History   Socioeconomic History  . Marital status: Legally Separated  Spouse name: Not on file  . Number of children: Not on file  . Years of education: Not on file  . Highest education level: Not on file  Occupational History  . Not on file  Social Needs  . Financial resource strain: Not on file  . Food insecurity:    Worry: Not on file    Inability: Not on file  . Transportation needs:    Medical: Not on file    Non-medical: Not on file  Tobacco Use  . Smoking status: Former Smoker    Packs/day: 0.10    Years: 10.00    Pack years: 1.00    Types: Cigarettes    Last attempt to quit: 06/30/2001    Years since quitting: 16.8  . Smokeless tobacco: Never Used  Substance and Sexual Activity  . Alcohol use: Yes     Comment: "drink alcohol once/yr; motorcycle ralley"  . Drug use: Yes    Types: Marijuana    Comment: "tried marijuana one time in 1970; couldn't even talk"  . Sexual activity: Not Currently  Lifestyle  . Physical activity:    Days per week: Not on file    Minutes per session: Not on file  . Stress: Not on file  Relationships  . Social connections:    Talks on phone: Not on file    Gets together: Not on file    Attends religious service: Not on file    Active member of club or organization: Not on file    Attends meetings of clubs or organizations: Not on file    Relationship status: Not on file  . Intimate partner violence:    Fear of current or ex partner: Not on file    Emotionally abused: Not on file    Physically abused: Not on file    Forced sexual activity: Not on file  Other Topics Concern  . Not on file  Social History Narrative  . Not on file     PHYSICAL EXAM:  VS: BP 134/76 (BP Location: Left Arm, Patient Position: Sitting, Cuff Size: Normal)   Pulse 76   Ht 5\' 7"  (1.702 m)   Wt 157 lb (71.2 kg)   SpO2 98%   BMI 24.59 kg/m  Physical Exam Gen: NAD, alert, cooperative with exam, well-appearing ENT: normal lips, normal nasal mucosa,  Eye: normal EOM, normal conjunctiva and lids CV:  no edema, +2 pedal pulses   Resp: no accessory muscle use, non-labored,  Skin: no rashes, no areas of induration  Neuro: normal tone, normal sensation to touch Psych:  normal insight, alert and oriented MSK:  Right hand: No signs of atrophy, swelling, or ecchymosis. Normal finger abduction and abduction strength resistance. Some pain with resistance to flexion of the second digit. No pain with resistance to wrist flexion or extension. Normal thumb opposition and pincer grasp. Neurovascularly intact   Aspiration/Injection Procedure Note [name] Allen [birth year]  Procedure: Injection Indications: Right wrist pain  Procedure Details Consent: Risks of procedure as well as  the alternatives and risks of each were explained to the (patient/caregiver).  Consent for procedure obtained. Time Out: Verified patient identification, verified procedure, site/side was marked, verified correct patient position, special equipment/implants available, medications/allergies/relevent history reviewed, required imaging and test results available.  Performed.  The area was cleaned with iodine and alcohol swabs.    The right carpal tunnel was injected using 1 cc's of 40 mg Depomedrol and 1 cc's of 0.25% bupivacaine with a 25 1 1/2"  needle.  Ultrasound was used. Images were obtained in  Long views showing the injection.    A sterile dressing was applied.  Patient did tolerate procedure well.       ASSESSMENT & PLAN:   Carpal tunnel syndrome Pain is ongoing.  Appears to have effusion around the flexor pollicis but also symptoms suggestive of carpal tunnel on exam.  Is unable to drive his motorcycle due to this pain. -Carpal tunnel injection today -Continue cock-up splint -If no improvement consider EMG versus physical therapy

## 2018-05-12 NOTE — Patient Instructions (Signed)
Good to see you that  Please continue the brace  Please try to ice the wrist  Please wear the brace at night.  Please follow up with me in 2 weeks

## 2018-05-12 NOTE — Assessment & Plan Note (Signed)
Pain is ongoing.  Appears to have effusion around the flexor pollicis but also symptoms suggestive of carpal tunnel on exam.  Is unable to drive his motorcycle due to this pain. -Carpal tunnel injection today -Continue cock-up splint -If no improvement consider EMG versus physical therapy

## 2018-05-27 ENCOUNTER — Ambulatory Visit: Payer: Medicare Other | Admitting: Family Medicine

## 2018-05-27 NOTE — Progress Notes (Deleted)
[name] - 67 y.o. male MRN [id number]  Date of birth: [birth year]  SUBJECTIVE:  Including CC & ROS.  No chief complaint on file.   [name] Allen is a 67 y.o. male that is  ***.  ***   Review of Systems  HISTORY: Past Medical, Surgical, Social, and Family History Reviewed & Updated per EMR.   Pertinent Historical Findings include:  Past Medical History:  Diagnosis Date  . Anxiety   . Coronary artery disease 2003   status post CABG 2005  //     [place] 03/26/12: Ostial LAD 30-40%, mid LAD 20%, circumflex with minor irregularities less than 10%, mid RCA 99%, LIMA-LAD patent but with significant competitive flow from the native LAD, EF 55-65%.  PCI: Evolve 2 study stent to the mid RCA.  . Depression ~ 2005  . Dysrhythmia 2003   irregular  . Ejection fraction    EF 55-65%, catheterization, July, 2013  . HLD (hyperlipidemia)   . Hypertension   . SOB (shortness of breath) on exertion    in the past/ had CABG    Past Surgical History:  Procedure Laterality Date  . CARDIAC SURGERY  2013  . CORONARY ANGIOPLASTY WITH STENT PLACEMENT  03/26/12   "1"  . CORONARY ARTERY BYPASS GRAFT  2003   CABG X1  . INGUINAL HERNIA REPAIR  ~ 2008   right  . LEFT HEART CATHETERIZATION WITH CORONARY/GRAFT ANGIOGRAM  03/26/2012   Procedure: LEFT HEART CATHETERIZATION WITH Beatrix Fetters;  Surgeon: Peter M Martinique, MD;  Location: [facility] CATH LAB;  Service: Cardiovascular;;  . PENILE PROSTHESIS IMPLANT N/A 09/11/2017   Procedure: PENILE PROTHESIS INFLATABLE AMS;  Surgeon: Lucas Mallow, MD;  Location: [facility];  Service: Urology;  Laterality: N/A;  . PERCUTANEOUS CORONARY STENT INTERVENTION (PCI-S)  03/26/2012   Procedure: PERCUTANEOUS CORONARY STENT INTERVENTION (PCI-S);  Surgeon: Peter M Martinique, MD;  Location: [facility] CATH LAB;  Service: Cardiovascular;;    No Known Allergies  Family History  Problem Relation Age of Onset  . Heart disease Mother   . Depression Mother   . Diabetes  Mother   . Early death Mother   . Hypertension Mother   . Hyperlipidemia Mother   . Kidney disease Father   . Early death Father   . Cancer Father   . Prostate cancer Father   . Hypertension Sister   . Colon cancer Neg Hx   . Esophageal cancer Neg Hx   . Ovarian cancer Neg Hx   . Pancreatic cancer Neg Hx   . Rectal cancer Neg Hx   . Stomach cancer Neg Hx      Social History   Socioeconomic History  . Marital status: Legally Separated    Spouse name: Not on file  . Number of children: Not on file  . Years of education: Not on file  . Highest education level: Not on file  Occupational History  . Not on file  Social Needs  . Financial resource strain: Not on file  . Food insecurity:    Worry: Not on file    Inability: Not on file  . Transportation needs:    Medical: Not on file    Non-medical: Not on file  Tobacco Use  . Smoking status: Former Smoker    Packs/day: 0.10    Years: 10.00    Pack years: 1.00    Types: Cigarettes    Last attempt to quit: 06/30/2001    Years since quitting:  16.9  . Smokeless tobacco: Never Used  Substance and Sexual Activity  . Alcohol use: Yes    Comment: "drink alcohol once/yr; motorcycle ralley"  . Drug use: Yes    Types: Marijuana    Comment: "tried marijuana one time in 1970; couldn't even talk"  . Sexual activity: Not Currently  Lifestyle  . Physical activity:    Days per week: Not on file    Minutes per session: Not on file  . Stress: Not on file  Relationships  . Social connections:    Talks on phone: Not on file    Gets together: Not on file    Attends religious service: Not on file    Active member of club or organization: Not on file    Attends meetings of clubs or organizations: Not on file    Relationship status: Not on file  . Intimate partner violence:    Fear of current or ex partner: Not on file    Emotionally abused: Not on file    Physically abused: Not on file    Forced sexual activity: Not on file    Other Topics Concern  . Not on file  Social History Narrative  . Not on file     PHYSICAL EXAM:  VS: There were no vitals taken for this visit. Physical Exam Gen: NAD, alert, cooperative with exam, well-appearing ENT: normal lips, normal nasal mucosa,  Eye: normal EOM, normal conjunctiva and lids CV:  no edema, +2 pedal pulses   Resp: no accessory muscle use, non-labored,  GI: no masses or tenderness, no hernia  Skin: no rashes, no areas of induration  Neuro: normal tone, normal sensation to touch Psych:  normal insight, alert and oriented MSK:  ***      ASSESSMENT & PLAN:   No problem-specific Assessment & Plan notes found for this encounter.

## 2018-05-28 ENCOUNTER — Other Ambulatory Visit: Payer: Self-pay | Admitting: Family Medicine

## 2018-05-31 ENCOUNTER — Other Ambulatory Visit: Payer: Self-pay | Admitting: Internal Medicine

## 2018-06-24 ENCOUNTER — Other Ambulatory Visit: Payer: Self-pay | Admitting: Family Medicine

## 2018-07-18 ENCOUNTER — Other Ambulatory Visit: Payer: Self-pay | Admitting: Family Medicine

## 2018-08-08 ENCOUNTER — Encounter: Payer: Self-pay | Admitting: Internal Medicine

## 2018-08-08 ENCOUNTER — Ambulatory Visit (INDEPENDENT_AMBULATORY_CARE_PROVIDER_SITE_OTHER): Payer: Medicare Other | Admitting: Internal Medicine

## 2018-08-08 ENCOUNTER — Ambulatory Visit: Payer: Medicare Other

## 2018-08-08 ENCOUNTER — Other Ambulatory Visit (INDEPENDENT_AMBULATORY_CARE_PROVIDER_SITE_OTHER): Payer: Medicare Other

## 2018-08-08 VITALS — BP 130/80 | HR 61 | Temp 97.9°F | Ht 67.0 in | Wt 166.0 lb

## 2018-08-08 DIAGNOSIS — Z0001 Encounter for general adult medical examination with abnormal findings: Secondary | ICD-10-CM

## 2018-08-08 DIAGNOSIS — E7800 Pure hypercholesterolemia, unspecified: Secondary | ICD-10-CM

## 2018-08-08 DIAGNOSIS — Z0000 Encounter for general adult medical examination without abnormal findings: Secondary | ICD-10-CM

## 2018-08-08 DIAGNOSIS — I10 Essential (primary) hypertension: Secondary | ICD-10-CM | POA: Diagnosis not present

## 2018-08-08 DIAGNOSIS — M79641 Pain in right hand: Secondary | ICD-10-CM | POA: Diagnosis not present

## 2018-08-08 LAB — LIPID PANEL
CHOLESTEROL: 182 mg/dL (ref 0–200)
HDL: 32.8 mg/dL — AB (ref >39.00)
LDL Cholesterol: 125 mg/dL — ABNORMAL HIGH (ref 0–99)
NonHDL: 148.92
Total CHOL/HDL Ratio: 6
Triglycerides: 122 mg/dL (ref 0.0–149.0)
VLDL: 24.4 mg/dL (ref 0.0–40.0)

## 2018-08-08 LAB — COMPREHENSIVE METABOLIC PANEL
ALBUMIN: 3.7 g/dL (ref 3.5–5.2)
ALK PHOS: 99 U/L (ref 39–117)
ALT: 17 U/L (ref 0–53)
AST: 23 U/L (ref 0–37)
BUN: 15 mg/dL (ref 6–23)
CHLORIDE: 107 meq/L (ref 96–112)
CO2: 26 mEq/L (ref 19–32)
Calcium: 8.9 mg/dL (ref 8.4–10.5)
Creatinine, Ser: 1.01 mg/dL (ref 0.40–1.50)
GFR: 94.27 mL/min (ref >60.00)
GLUCOSE: 86 mg/dL (ref 70–99)
POTASSIUM: 3.9 meq/L (ref 3.5–5.1)
Sodium: 139 mEq/L (ref 135–145)
TOTAL PROTEIN: 7 g/dL (ref 6.0–8.3)
Total Bilirubin: 0.3 mg/dL (ref 0.2–1.2)

## 2018-08-08 LAB — CBC
HEMATOCRIT: 39 % (ref 39.0–52.0)
HEMOGLOBIN: 12.7 g/dL — AB (ref 13.0–17.0)
MCHC: 32.5 g/dL (ref 30.0–36.0)
MCV: 85.6 fl (ref 78.0–100.0)
Platelets: 347 10*3/uL (ref 150.0–400.0)
RBC: 4.55 Mil/uL (ref 4.22–5.81)
RDW: 15.4 % (ref 11.5–15.5)
WBC: 8 10*3/uL (ref 4.0–10.5)

## 2018-08-08 LAB — HEMOGLOBIN A1C: Hgb A1c MFr Bld: 6 % (ref 4.6–6.5)

## 2018-08-08 MED ORDER — CICLOPIROX 8 % EX SOLN: Freq: Every day | CUTANEOUS | 6 refills | Status: DC

## 2018-08-08 MED ORDER — MELOXICAM 15 MG PO TABS: 15.0000 mg | ORAL_TABLET | Freq: Every day | ORAL | 0 refills | Status: DC

## 2018-08-08 NOTE — Assessment & Plan Note (Signed)
Rx for meloxicam and advised to stop naproxen.

## 2018-08-08 NOTE — Assessment & Plan Note (Signed)
Not taking lipitor 40 mg daily. Checking lipid panel and adjust as needed.

## 2018-08-08 NOTE — Patient Instructions (Addendum)
We have sent in the topical medicine for the toenail fungus to use on them.   Health Maintenance, Male A healthy lifestyle and preventive care is important for your health and wellness. Ask your health care provider about what schedule of regular examinations is right for you. What should I know about weight and diet? Eat a Healthy Diet  Eat plenty of vegetables, fruits, whole grains, low-fat dairy products, and lean protein.  Do not eat a lot of foods high in solid fats, added sugars, or salt.  Maintain a Healthy Weight Regular exercise can help you achieve or maintain a healthy weight. You should:  Do at least 150 minutes of exercise each week. The exercise should increase your heart rate and make you sweat (moderate-intensity exercise).  Do strength-training exercises at least twice a week.  Watch Your Levels of Cholesterol and Blood Lipids  Have your blood tested for lipids and cholesterol every 5 years starting at 68 years of age. If you are at high risk for heart disease, you should start having your blood tested when you are 68 years old. You may need to have your cholesterol levels checked more often if: ? Your lipid or cholesterol levels are high. ? You are older than 68 years of age. ? You are at high risk for heart disease.  What should I know about cancer screening? Many types of cancers can be detected early and may often be prevented. Lung Cancer  You should be screened every year for lung cancer if: ? You are a current smoker who has smoked for at least 30 years. ? You are a former smoker who has quit within the past 15 years.  Talk to your health care provider about your screening options, when you should start screening, and how often you should be screened.  Colorectal Cancer  Routine colorectal cancer screening usually begins at 68 years of age and should be repeated every 5-10 years until you are 68 years old. You may need to be screened more often if early  forms of precancerous polyps or small growths are found. Your health care provider may recommend screening at an earlier age if you have risk factors for colon cancer.  Your health care provider may recommend using home test kits to check for hidden blood in the stool.  A small camera at the end of a tube can be used to examine your colon (sigmoidoscopy or colonoscopy). This checks for the earliest forms of colorectal cancer.  Prostate and Testicular Cancer  Depending on your age and overall health, your health care provider may do certain tests to screen for prostate and testicular cancer.  Talk to your health care provider about any symptoms or concerns you have about testicular or prostate cancer.  Skin Cancer  Check your skin from head to toe regularly.  Tell your health care provider about any new moles or changes in moles, especially if: ? There is a change in a mole's size, shape, or color. ? You have a mole that is larger than a pencil eraser.  Always use sunscreen. Apply sunscreen liberally and repeat throughout the day.  Protect yourself by wearing long sleeves, pants, a wide-brimmed hat, and sunglasses when outside.  What should I know about heart disease, diabetes, and high blood pressure?  If you are 63-62 years of age, have your blood pressure checked every 3-5 years. If you are 61 years of age or older, have your blood pressure checked every year. You should  have your blood pressure measured twice-once when you are at a hospital or clinic, and once when you are not at a hospital or clinic. Record the average of the two measurements. To check your blood pressure when you are not at a hospital or clinic, you can use: ? An automated blood pressure machine at a pharmacy. ? A home blood pressure monitor.  Talk to your health care provider about your target blood pressure.  If you are between 20-51 years old, ask your health care provider if you should take aspirin to prevent  heart disease.  Have regular diabetes screenings by checking your fasting blood sugar level. ? If you are at a normal weight and have a low risk for diabetes, have this test once every three years after the age of 48. ? If you are overweight and have a high risk for diabetes, consider being tested at a younger age or more often.  A one-time screening for abdominal aortic aneurysm (AAA) by ultrasound is recommended for men aged 81-75 years who are current or former smokers. What should I know about preventing infection? Hepatitis B If you have a higher risk for hepatitis B, you should be screened for this virus. Talk with your health care provider to find out if you are at risk for hepatitis B infection. Hepatitis C Blood testing is recommended for:  Everyone born from [birth year] through 1966.  Anyone with known risk factors for hepatitis C.  Sexually Transmitted Diseases (STDs)  You should be screened each year for STDs including gonorrhea and chlamydia if: ? You are sexually active and are younger than 68 years of age. ? You are older than 68 years of age and your health care provider tells you that you are at risk for this type of infection. ? Your sexual activity has changed since you were last screened and you are at an increased risk for chlamydia or gonorrhea. Ask your health care provider if you are at risk.  Talk with your health care provider about whether you are at high risk of being infected with HIV. Your health care provider may recommend a prescription medicine to help prevent HIV infection.  What else can I do?  Schedule regular health, dental, and eye exams.  Stay current with your vaccines (immunizations).  Do not use any tobacco products, such as cigarettes, chewing tobacco, and e-cigarettes. If you need help quitting, ask your health care provider.  Limit alcohol intake to no more than 2 drinks per day. One drink equals 12 ounces of beer, 5 ounces of wine, or 1 ounces  of hard liquor.  Do not use street drugs.  Do not share needles.  Ask your health care provider for help if you need support or information about quitting drugs.  Tell your health care provider if you often feel depressed.  Tell your health care provider if you have ever been abused or do not feel safe at home. This information is not intended to replace advice given to you by your health care provider. Make sure you discuss any questions you have with your health care provider. Document Released: 03/08/2008 Document Revised: 05/09/2016 Document Reviewed: 06/14/2015 Elsevier Interactive Patient Education  [employer].

## 2018-08-08 NOTE — Assessment & Plan Note (Signed)
Flu shot up to date. Pneumonia up to date. Shingrix counseled. Tetanus up to date. Cologuard up to date. Counseled about sun safety and mole surveillance. Counseled about the dangers of distracted driving. Given 10 year screening recommendations.

## 2018-08-08 NOTE — Progress Notes (Addendum)
   Subjective:    Patient ID: [name] Allen, male    DOB: 06-29-1951, 67 y.o.   MRN: [id number]  HPI Here for medicare wellness and physical, no new complaints. Please see A/P for status and treatment of chronic medical problems.   Diet: heart healthy Physical activity: sedentary Depression/mood screen: negative Hearing: intact to whispered voice Visual acuity: grossly normal with lens, performs annual eye exam  ADLs: capable Fall risk: none Home safety: good Cognitive evaluation: intact to orientation, naming, recall and repetition EOL planning: adv directives discussed  I have personally reviewed and have noted 1. The patient's medical and social history - reviewed today no changes 2. Their use of alcohol, tobacco or illicit drugs 3. Their current medications and supplements 4. The patient's functional ability including ADL's, fall risks, home safety risks and hearing or visual impairment. 5. Diet and physical activities 6. Evidence for depression or mood disorders 7. Care team reviewed and updated (available in snapshot)  Review of Systems  Constitutional: Negative.   HENT: Negative.   Eyes: Negative.   Respiratory: Negative for cough, chest tightness and shortness of breath.   Cardiovascular: Negative for chest pain, palpitations and leg swelling.  Gastrointestinal: Negative for abdominal distention, abdominal pain, constipation, diarrhea, nausea and vomiting.  Musculoskeletal: Positive for arthralgias.  Skin: Negative.   Neurological: Negative.   Psychiatric/Behavioral: Negative.       Objective:   Physical Exam  Constitutional: He is oriented to person, place, and time. He appears well-developed and well-nourished.  HENT:  Head: Normocephalic and atraumatic.  Eyes: EOM are normal.  Neck: Normal range of motion.  Cardiovascular: Normal rate and regular rhythm.  Pulmonary/Chest: Effort normal and breath sounds normal. No respiratory distress. He has no wheezes. He  has no rales.  Abdominal: Soft. Bowel sounds are normal. He exhibits no distension. There is no tenderness. There is no rebound.  Musculoskeletal: He exhibits tenderness. He exhibits no edema.  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 Coordination normal.  Skin: Skin is warm and dry.  Psychiatric: He has a normal mood and affect.   Vitals:   08/08/18 0943  BP: 130/80  Pulse: 61  Temp: 97.9 F (36.6 C)  TempSrc: Oral  SpO2: 98%  Weight: 166 lb (75.3 kg)  Height: 5\' 7"  (1.702 m)      Assessment & Plan:

## 2018-08-08 NOTE — Assessment & Plan Note (Signed)
Not taking his blood pressure medicine for some time. BP at goal. Checking CMP and adjust as needed.

## 2018-08-11 ENCOUNTER — Other Ambulatory Visit: Payer: Self-pay | Admitting: Internal Medicine

## 2018-08-11 MED ORDER — ATORVASTATIN CALCIUM 20 MG PO TABS: 20.0000 mg | ORAL_TABLET | Freq: Every day | ORAL | 3 refills | Status: DC

## 2018-08-14 ENCOUNTER — Ambulatory Visit: Payer: Medicare Other

## 2018-08-18 ENCOUNTER — Telehealth: Payer: Self-pay | Admitting: Internal Medicine

## 2018-08-18 MED ORDER — VARENICLINE TARTRATE 1 MG PO TABS: 1.0000 mg | ORAL_TABLET | Freq: Two times a day (BID) | ORAL | 2 refills | Status: DC

## 2018-08-18 MED ORDER — VARENICLINE TARTRATE 0.5 MG X 11 & 1 MG X 42 PO MISC: ORAL | 0 refills | Status: DC

## 2018-08-18 NOTE — Telephone Encounter (Signed)
Patient informed of Md response  

## 2018-08-18 NOTE — Telephone Encounter (Signed)
Patient would like chantix sent in

## 2018-08-18 NOTE — Addendum Note (Signed)
Addended by: Pricilla Holm A on: 08/18/2018 03:58 PM   Modules accepted: Orders

## 2018-08-18 NOTE — Telephone Encounter (Signed)
Copied from [place] [id number]. Topic: Quick Communication - See Telephone Encounter >> Aug 18, 2018 11:54 AM Margot Ables wrote: CRM for notification. See Telephone encounter for: 08/18/18. Pt was in to see Dr. Sharlet Salina 11/15 and states they discussed smoking cessation. Pt is asking if Chantix would be an option for him. Please advise.  [id number] [address], [place] [phone] (Phone) [phone] (Fax)

## 2018-08-18 NOTE — Telephone Encounter (Signed)
Sent in starting month and continuing month. He should follow directions on package for starting month. Once on second month take 1 pill twice daily and he should plan to quit within first month. He should plan to take chantix at least 3 months (can take up to 6 months) to help him stay quit.

## 2018-08-18 NOTE — Telephone Encounter (Signed)
Sure, does he want this sent in for him?

## 2018-08-31 ENCOUNTER — Other Ambulatory Visit: Payer: Self-pay | Admitting: Internal Medicine

## 2018-09-19 ENCOUNTER — Other Ambulatory Visit: Payer: Self-pay | Admitting: Internal Medicine

## 2018-09-29 ENCOUNTER — Other Ambulatory Visit: Payer: Self-pay | Admitting: Internal Medicine

## 2018-10-15 ENCOUNTER — Other Ambulatory Visit: Payer: Self-pay | Admitting: Internal Medicine

## 2018-10-24 ENCOUNTER — Other Ambulatory Visit: Payer: Self-pay | Admitting: Internal Medicine

## 2019-04-25 ENCOUNTER — Other Ambulatory Visit: Payer: Self-pay | Admitting: Internal Medicine

## 2019-04-25 DIAGNOSIS — Z20822 Contact with and (suspected) exposure to covid-19: Secondary | ICD-10-CM

## 2019-04-26 LAB — NOVEL CORONAVIRUS, NAA: SARS-CoV-2, NAA: NOT DETECTED

## 2019-06-26 DIAGNOSIS — H524 Presbyopia: Secondary | ICD-10-CM | POA: Diagnosis not present

## 2019-08-29 ENCOUNTER — Encounter (HOSPITAL_COMMUNITY): Payer: Self-pay | Admitting: Emergency Medicine

## 2019-08-29 ENCOUNTER — Emergency Department (HOSPITAL_COMMUNITY)
Admission: EM | Admit: 2019-08-29 | Discharge: 2019-08-30 | Disposition: A | Discharge status: Home / Self Care | Payer: Medicare Other | Attending: Emergency Medicine | Admitting: Emergency Medicine

## 2019-08-29 ENCOUNTER — Other Ambulatory Visit: Payer: Self-pay

## 2019-08-29 DIAGNOSIS — I10 Essential (primary) hypertension: Secondary | ICD-10-CM | POA: Insufficient documentation

## 2019-08-29 DIAGNOSIS — Z7982 Long term (current) use of aspirin: Secondary | ICD-10-CM | POA: Insufficient documentation

## 2019-08-29 DIAGNOSIS — R339 Retention of urine, unspecified: Secondary | ICD-10-CM | POA: Diagnosis not present

## 2019-08-29 DIAGNOSIS — N401 Enlarged prostate with lower urinary tract symptoms: Secondary | ICD-10-CM | POA: Diagnosis not present

## 2019-08-29 DIAGNOSIS — F17210 Nicotine dependence, cigarettes, uncomplicated: Secondary | ICD-10-CM | POA: Diagnosis not present

## 2019-08-29 DIAGNOSIS — Z79899 Other long term (current) drug therapy: Secondary | ICD-10-CM | POA: Diagnosis not present

## 2019-08-29 DIAGNOSIS — R338 Other retention of urine: Secondary | ICD-10-CM

## 2019-08-29 LAB — URINALYSIS, ROUTINE W REFLEX MICROSCOPIC
Bilirubin Urine: NEGATIVE
Glucose, UA: NEGATIVE mg/dL
Ketones, ur: NEGATIVE mg/dL
Leukocytes,Ua: NEGATIVE
Nitrite: POSITIVE — AB
Protein, ur: 100 mg/dL — AB
Specific Gravity, Urine: 1.02 (ref 1.005–1.030)
pH: 5 (ref 5.0–8.0)

## 2019-08-29 MED ORDER — TAMSULOSIN HCL 0.4 MG PO CAPS: 0.4000 mg | ORAL_CAPSULE | Freq: Every day | ORAL | 0 refills | Status: DC

## 2019-08-29 MED ORDER — CEPHALEXIN 500 MG PO CAPS: 500.0000 mg | ORAL_CAPSULE | Freq: Two times a day (BID) | ORAL | 0 refills | Status: DC

## 2019-08-29 NOTE — ED Triage Notes (Signed)
Patient here from home with complaints of urinary retention. Reports that he has not been able to urinate since Monday, "Ive just been dribbling".
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
## 2019-09-01 ENCOUNTER — Other Ambulatory Visit: Payer: Self-pay

## 2019-09-01 ENCOUNTER — Telehealth: Payer: Self-pay

## 2019-09-01 ENCOUNTER — Other Ambulatory Visit (INDEPENDENT_AMBULATORY_CARE_PROVIDER_SITE_OTHER): Payer: Medicare Other

## 2019-09-01 ENCOUNTER — Ambulatory Visit (INDEPENDENT_AMBULATORY_CARE_PROVIDER_SITE_OTHER): Payer: Medicare Other | Admitting: Internal Medicine

## 2019-09-01 ENCOUNTER — Encounter: Payer: Self-pay | Admitting: Internal Medicine

## 2019-09-01 VITALS — BP 130/86 | HR 87 | Temp 98.2°F | Ht 67.0 in | Wt 162.0 lb

## 2019-09-01 DIAGNOSIS — I10 Essential (primary) hypertension: Secondary | ICD-10-CM

## 2019-09-01 DIAGNOSIS — R339 Retention of urine, unspecified: Secondary | ICD-10-CM | POA: Diagnosis not present

## 2019-09-01 DIAGNOSIS — N3001 Acute cystitis with hematuria: Secondary | ICD-10-CM | POA: Diagnosis not present

## 2019-09-01 LAB — COMPREHENSIVE METABOLIC PANEL
ALT: 12 U/L (ref 0–53)
AST: 14 U/L (ref 0–37)
Albumin: 3.6 g/dL (ref 3.5–5.2)
Alkaline Phosphatase: 108 U/L (ref 39–117)
BUN: 13 mg/dL (ref 6–23)
CO2: 28 mEq/L (ref 19–32)
Calcium: 9.6 mg/dL (ref 8.4–10.5)
Chloride: 98 mEq/L (ref 96–112)
Creatinine, Ser: 1.12 mg/dL (ref 0.40–1.50)
GFR: 78.48 mL/min (ref >60.00)
Glucose, Bld: 119 mg/dL — ABNORMAL HIGH (ref 70–99)
Potassium: 3.1 mEq/L — ABNORMAL LOW (ref 3.5–5.1)
Sodium: 136 mEq/L (ref 135–145)
Total Bilirubin: 0.5 mg/dL (ref 0.2–1.2)
Total Protein: 7.9 g/dL (ref 6.0–8.3)

## 2019-09-01 LAB — URINE CULTURE: Culture: >=100000 — AB

## 2019-09-01 LAB — LIPID PANEL
Cholesterol: 160 mg/dL (ref 0–200)
HDL: 27.2 mg/dL — ABNORMAL LOW (ref >39.00)
LDL Cholesterol: 116 mg/dL — ABNORMAL HIGH (ref 0–99)
NonHDL: 132.4
Total CHOL/HDL Ratio: 6
Triglycerides: 80 mg/dL (ref 0.0–149.0)
VLDL: 16 mg/dL (ref 0.0–40.0)

## 2019-09-01 LAB — CBC
HCT: 40.5 % (ref 39.0–52.0)
Hemoglobin: 13.4 g/dL (ref 13.0–17.0)
MCHC: 32.9 g/dL (ref 30.0–36.0)
MCV: 87.2 fl (ref 78.0–100.0)
Platelets: 439 10*3/uL — ABNORMAL HIGH (ref 150.0–400.0)
RBC: 4.65 Mil/uL (ref 4.22–5.81)
RDW: 15 % (ref 11.5–15.5)
WBC: 19.5 10*3/uL (ref 4.0–10.5)

## 2019-09-01 LAB — HEMOGLOBIN A1C: Hgb A1c MFr Bld: 5.8 % (ref 4.6–6.5)

## 2019-09-01 NOTE — Progress Notes (Signed)
   Subjective:   Patient ID: [name] Allen, male    DOB: [birth year], 68 y.o.   MRN: [id number]  HPI The patient is a 68 YO man coming in for ER follow up (in for urinary retention, UTI, catheter inserted and given antibiotics and tamsulosin). He has not called urology to set up follow up (advised to be seen within a week) as he thought that they were to call him. He is still with the catheter and having blood tinged in the bag still. He denies pain in his stomach. No pain from the catheter. Denies fevers or chills. Taking keflex and tamsulosin. Denies side effects from either. Denies diarrhea or constipation. Denies SOB or chest pains. Denies lightheadedness. He is concerned about the ongoing blood from catheter output.  PMH, [facility], social history reviewed and updated  Review of Systems  Constitutional: Negative.   HENT: Negative.   Eyes: Negative.   Respiratory: Negative for cough, chest tightness and shortness of breath.   Cardiovascular: Negative for chest pain, palpitations and leg swelling.  Gastrointestinal: Negative for abdominal distention, abdominal pain, constipation, diarrhea, nausea and vomiting.  Genitourinary: Positive for difficulty urinating and hematuria.  Musculoskeletal: Negative.   Skin: Negative.   Neurological: Negative.   Psychiatric/Behavioral: Negative.     Objective:  Physical Exam Constitutional:      Appearance: He is well-developed.  HENT:     Head: Normocephalic and atraumatic.  Neck:     Musculoskeletal: Normal range of motion.  Cardiovascular:     Rate and Rhythm: Normal rate and regular rhythm.  Pulmonary:     Effort: Pulmonary effort is normal. No respiratory distress.     Breath sounds: Normal breath sounds. No wheezing or rales.  Abdominal:     General: Bowel sounds are normal. There is no distension.     Palpations: Abdomen is soft.     Tenderness: There is no abdominal tenderness. There is no rebound.  Skin:    General: Skin is warm and dry.   Neurological:     Mental Status: He is alert and oriented to person, place, and time.     Coordination: Coordination normal.     Vitals:   09/01/19 1046  BP: 130/86  Pulse: 87  Temp: 98.2 F (36.8 C)  TempSrc: Oral  SpO2: 96%  Weight: 162 lb (73.5 kg)  Height: 5\' 7"  (1.702 m)    This visit occurred during the SARS-CoV-2 public health emergency.  Safety protocols were in place, including screening questions prior to the visit, additional usage of staff PPE, and extensive cleaning of exam room while observing appropriate contact time as indicated for disinfecting solutions.   Assessment & Plan:

## 2019-09-01 NOTE — Assessment & Plan Note (Signed)
Given blood in catheter bag needs CBC and CMP today. No labs were done at ER. Continue keflex and tamsulosin and urgent referral to urology for likely void trial.

## 2019-09-01 NOTE — Assessment & Plan Note (Addendum)
Likely the blood is related to catheter but needs CBC today to check Hg levels. Needs CMP as not done at ER to look for AKI. Culture results reviewed and keflex is an appropriate antibiotic.

## 2019-09-01 NOTE — Telephone Encounter (Signed)
CRITICAL VALUE STICKER  CRITICAL VALUE: WBC 19.5  RECEIVER (on-site recipient of call):  Briana  DATE & TIME NOTIFIED: 09/01/2019 at 11:37  MESSENGER (representative from lab): Santiago Glad   MD NOTIFIED:   TIME OF NOTIFICATION:  RESPONSE:

## 2019-09-01 NOTE — Patient Instructions (Signed)
We will get you in with the urologist.

## 2019-09-02 ENCOUNTER — Telehealth: Payer: Self-pay | Admitting: Emergency Medicine

## 2019-09-02 NOTE — Telephone Encounter (Signed)
Post ED Visit - Positive Culture Follow-up  Culture report reviewed by antimicrobial stewardship pharmacist: [place] Team []  Elenor Quinones, Pharm.D. []  Heide Guile, Pharm.D., BCPS AQ-ID []  Parks Neptune, Pharm.D., BCPS []  Alycia Rossetti, Pharm.D., BCPS []  [place], Pharm.D., BCPS, AAHIVP []  Legrand Como, Pharm.D., BCPS, AAHIVP []  Salome Arnt, PharmD, BCPS []  Johnnette Gourd, PharmD, BCPS []  Hughes Better, PharmD, BCPS []  Leeroy Cha, PharmD []  Laqueta Linden, PharmD, BCPS []  Albertina Parr, PharmD  [place] Team []  Leodis Sias, PharmD []  Lindell Spar, PharmD []  Royetta Asal, PharmD []  Graylin Shiver, Rph []  Rema Fendt) Glennon Mac, PharmD []  Arlyn Dunning, PharmD []  Netta Cedars, PharmD []  Dia Sitter, PharmD []  Leone Haven, PharmD []  Gretta Arab, PharmD []  Theodis Shove, PharmD []  Peggyann Juba, PharmD [x]  Reuel Boom, PharmD   Positive urine culture Treated with cephalexin, organism sensitive to the same and no further patient follow-up is required at this time.  Hazle Nordmann 09/02/2019, 1:10 PM

## 2019-09-03 ENCOUNTER — Other Ambulatory Visit: Payer: Self-pay | Admitting: Internal Medicine

## 2019-09-03 DIAGNOSIS — D72829 Elevated white blood cell count, unspecified: Secondary | ICD-10-CM

## 2019-09-03 MED ORDER — POTASSIUM CHLORIDE CRYS ER 20 MEQ PO TBCR: 40.0000 meq | EXTENDED_RELEASE_TABLET | Freq: Every day | ORAL | 0 refills | Status: DC

## 2019-09-08 ENCOUNTER — Ambulatory Visit: Payer: Self-pay

## 2019-09-08 ENCOUNTER — Telehealth: Payer: Self-pay | Admitting: Internal Medicine

## 2019-09-08 NOTE — Telephone Encounter (Signed)
Patient states that he can pass gas and no nausea and vomiting states feels normal but wants something to help pas a BM

## 2019-09-08 NOTE — Telephone Encounter (Signed)
Patient informed of MD response  

## 2019-09-08 NOTE — Telephone Encounter (Unsigned)
Copied from [place] [id number]. Topic: General - Other >> Sep 08, 2019 10:53 AM Yvette Rack wrote: Reason for CRM: Melanie with [facility] stated pt has a catheter and he informed her that he has not been having bowel movements for some time now. Melanie asked that a nurse contact the pt to discuss. Cb# [phone] Ext. [phone]

## 2019-09-08 NOTE — Telephone Encounter (Signed)
This is not complete information. Is he passing gas? Nausea/vomiting?

## 2019-09-08 NOTE — Telephone Encounter (Signed)
Can try magnesium citrate 1 bottle today and then 1 bottle tomorrow if no BM. This is over the counter and usually effective.

## 2019-09-08 NOTE — Telephone Encounter (Signed)
Pt. Reports he has not had a bowel movement "in almost 2 weeks." Denies any abdominal pain or bloating. Has tried Miralax. Reports "I drink a lot of water."Refuses an appointment."I would like something called in for me to make me go." Please advise pt.  Answer Assessment - Initial Assessment Questions 1. STOOL PATTERN OR FREQUENCY: "How often do you pass bowel movements (BMs)?"  (Normal range: tid to q 3 days)  "When was the last BM passed?"       Every day 2. STRAINING: "Do you have to strain to have a BM?"      Yes 3. RECTAL PAIN: "Does your rectum hurt when the stool comes out?" If so, ask: "Do you have hemorrhoids? How bad is the pain?"  (Scale 1-10; or mild, moderate, severe)     No pain 4. STOOL COMPOSITION: "Are the stools hard?"      No 5. BLOOD ON STOOLS: "Has there been any blood on the toilet tissue or on the surface of the BM?" If so, ask: "When was the last time?"      No 6. CHRONIC CONSTIPATION: "Is this a new problem for you?"  If no, ask: "How long have you had this problem?" (days, weeks, months)      No 7. CHANGES IN DIET: "Have there been any recent changes in your diet?"      No 8. MEDICATIONS: "Have you been taking any new medications?"     No 9. LAXATIVES: "Have you been using any laxatives or enemas?"  If yes, ask "What, how often, and when was the last time?"     Miralax 10. CAUSE: "What do you think is causing the constipation?"        Unsure 11. OTHER SYMPTOMS: "Do you have any other symptoms?" (e.g., abdominal pain, fever, vomiting)       No pain 12. PREGNANCY: "Is there any chance you are pregnant?" "When was your last menstrual period?"       n/a  Protocols used: CONSTIPATION-A-AH

## 2019-09-14 DIAGNOSIS — R338 Other retention of urine: Secondary | ICD-10-CM | POA: Diagnosis not present

## 2019-10-22 ENCOUNTER — Other Ambulatory Visit: Payer: Self-pay | Admitting: Internal Medicine

## 2019-11-19 ENCOUNTER — Ambulatory Visit (INDEPENDENT_AMBULATORY_CARE_PROVIDER_SITE_OTHER): Payer: Medicare Other | Admitting: Internal Medicine

## 2019-11-19 ENCOUNTER — Encounter: Payer: Self-pay | Admitting: Internal Medicine

## 2019-11-19 ENCOUNTER — Other Ambulatory Visit: Payer: Self-pay

## 2019-11-19 VITALS — BP 138/86 | HR 70 | Temp 97.6°F | Ht 67.0 in | Wt 171.0 lb

## 2019-11-19 DIAGNOSIS — E7800 Pure hypercholesterolemia, unspecified: Secondary | ICD-10-CM

## 2019-11-19 DIAGNOSIS — Z0000 Encounter for general adult medical examination without abnormal findings: Secondary | ICD-10-CM

## 2019-11-19 DIAGNOSIS — I10 Essential (primary) hypertension: Secondary | ICD-10-CM | POA: Diagnosis not present

## 2019-11-19 DIAGNOSIS — D72829 Elevated white blood cell count, unspecified: Secondary | ICD-10-CM

## 2019-11-19 LAB — CBC WITH DIFFERENTIAL/PLATELET
Basophils Absolute: 0.1 10*3/uL (ref 0.0–0.1)
Basophils Relative: 1 % (ref 0.0–3.0)
Eosinophils Absolute: 0.7 10*3/uL (ref 0.0–0.7)
Eosinophils Relative: 7.4 % — ABNORMAL HIGH (ref 0.0–5.0)
HCT: 40.6 % (ref 39.0–52.0)
Hemoglobin: 13.5 g/dL (ref 13.0–17.0)
Lymphocytes Relative: 27.5 % (ref 12.0–46.0)
Lymphs Abs: 2.7 10*3/uL (ref 0.7–4.0)
MCHC: 33.2 g/dL (ref 30.0–36.0)
MCV: 87.7 fl (ref 78.0–100.0)
Monocytes Absolute: 0.7 10*3/uL (ref 0.1–1.0)
Monocytes Relative: 7.6 % (ref 3.0–12.0)
Neutro Abs: 5.6 10*3/uL (ref 1.4–7.7)
Neutrophils Relative %: 56.5 % (ref 43.0–77.0)
Platelets: 403 10*3/uL — ABNORMAL HIGH (ref 150.0–400.0)
RBC: 4.63 Mil/uL (ref 4.22–5.81)
RDW: 15.3 % (ref 11.5–15.5)
WBC: 9.9 10*3/uL (ref 4.0–10.5)

## 2019-11-19 NOTE — Assessment & Plan Note (Signed)
Flu shot up to date. Pneumonia complete. Shingrix counseled. Tetanus due 2023. Cologuard due 2021. Counseled about sun safety and mole surveillance. Counseled about the dangers of distracted driving. Given 10 year screening recommendations.

## 2019-11-19 NOTE — Assessment & Plan Note (Signed)
Checking lipid panel and adjust lipitor 20 mg daily as needed. 

## 2019-11-19 NOTE — Assessment & Plan Note (Signed)
BP at goal off meds for now. Continue close monitoring.

## 2019-11-19 NOTE — Progress Notes (Signed)
Subjective:   Patient ID: [name] Allen, male    DOB: 1951-09-21, 69 y.o.   MRN: [id number]  HPI Here for medicare wellness and physical, no new complaints. Please see A/P for status and treatment of chronic medical problems.   Diet: heart healthy Physical activity: 45 minutes lifting daily Depression/mood screen: negative Hearing: intact to whispered voice Visual acuity: grossly normal with lens, performs annual eye exam  ADLs: capable Fall risk: none Home safety: good Cognitive evaluation: intact to orientation, naming, recall and repetition EOL planning: adv directives discussed    Office Visit from 11/19/2019 in [place] at [facility] Total Score  0      I have personally reviewed and have noted 1. The patient's medical and social history - reviewed today no changes 2. Their use of alcohol, tobacco or illicit drugs 3. Their current medications and supplements 4. The patient's functional ability including ADL's, fall risks, home safety risks and hearing or visual impairment. 5. Diet and physical activities 6. Evidence for depression or mood disorders 7. Care team reviewed and updated  Patient Care Team: Hoyt Koch, MD as PCP - General (Internal Medicine) Past Medical History:  Diagnosis Date  . Anxiety   . Coronary artery disease 2003   status post CABG 2005  //     [place] 03/26/12: Ostial LAD 30-40%, mid LAD 20%, circumflex with minor irregularities less than 10%, mid RCA 99%, LIMA-LAD patent but with significant competitive flow from the native LAD, EF 55-65%.  PCI: Evolve 2 study stent to the mid RCA.  . Depression ~ 2005  . Dysrhythmia 2003   irregular  . Ejection fraction    EF 55-65%, catheterization, July, 2013  . HLD (hyperlipidemia)   . Hypertension   . SOB (shortness of breath) on exertion    in the past/ had CABG   Past Surgical History:  Procedure Laterality Date  . CARDIAC SURGERY  2013  . CORONARY ANGIOPLASTY WITH STENT  PLACEMENT  03/26/12   "1"  . CORONARY ARTERY BYPASS GRAFT  2003   CABG X1  . INGUINAL HERNIA REPAIR  ~ 2008   right  . LEFT HEART CATHETERIZATION WITH CORONARY/GRAFT ANGIOGRAM  03/26/2012   Procedure: LEFT HEART CATHETERIZATION WITH Beatrix Fetters;  Surgeon: Peter M Martinique, MD;  Location: [facility] CATH LAB;  Service: Cardiovascular;;  . PENILE PROSTHESIS IMPLANT N/A 09/11/2017   Procedure: PENILE PROTHESIS INFLATABLE AMS;  Surgeon: Lucas Mallow, MD;  Location: [facility];  Service: Urology;  Laterality: N/A;  . PERCUTANEOUS CORONARY STENT INTERVENTION (PCI-S)  03/26/2012   Procedure: PERCUTANEOUS CORONARY STENT INTERVENTION (PCI-S);  Surgeon: Peter M Martinique, MD;  Location: [facility] CATH LAB;  Service: Cardiovascular;;   Family History  Problem Relation Age of Onset  . Heart disease Mother   . Depression Mother   . Diabetes Mother   . Early death Mother   . Hypertension Mother   . Hyperlipidemia Mother   . Kidney disease Father   . Early death Father   . Cancer Father   . Prostate cancer Father   . Hypertension Sister   . Colon cancer Neg Hx   . Esophageal cancer Neg Hx   . Ovarian cancer Neg Hx   . Pancreatic cancer Neg Hx   . Rectal cancer Neg Hx   . Stomach cancer Neg Hx    Review of Systems  Constitutional: Negative.   HENT: Negative.   Eyes: Negative.   Respiratory: Negative  for cough, chest tightness and shortness of breath.   Cardiovascular: Negative for chest pain, palpitations and leg swelling.  Gastrointestinal: Negative for abdominal distention, abdominal pain, constipation, diarrhea, nausea and vomiting.  Musculoskeletal: Negative.   Skin: Negative.   Neurological: Negative.   Psychiatric/Behavioral: Negative.     Objective:  Physical Exam Constitutional:      Appearance: He is well-developed.  HENT:     Head: Normocephalic and atraumatic.  Cardiovascular:     Rate and Rhythm: Normal rate and regular rhythm.  Pulmonary:     Effort:  Pulmonary effort is normal. No respiratory distress.     Breath sounds: Normal breath sounds. No wheezing or rales.  Abdominal:     General: Bowel sounds are normal. There is no distension.     Palpations: Abdomen is soft.     Tenderness: There is no abdominal tenderness. There is no rebound.  Musculoskeletal:     Cervical back: Normal range of motion.  Skin:    General: Skin is warm and dry.  Neurological:     Mental Status: He is alert and oriented to person, place, and time.     Coordination: Coordination normal.     Vitals:   11/19/19 0946  BP: 138/86  Pulse: 70  Temp: 97.6 F (36.4 C)  TempSrc: Oral  SpO2: 96%  Weight: 171 lb (77.6 kg)  Height: 5\' 7"  (1.702 m)    This visit occurred during the SARS-CoV-2 public health emergency.  Safety protocols were in place, including screening questions prior to the visit, additional usage of staff PPE, and extensive cleaning of exam room while observing appropriate contact time as indicated for disinfecting solutions.   Assessment & Plan:

## 2019-11-19 NOTE — Patient Instructions (Signed)

## 2019-11-20 ENCOUNTER — Other Ambulatory Visit: Payer: Self-pay | Admitting: Internal Medicine

## 2019-11-20 LAB — PATHOLOGIST SMEAR REVIEW

## 2020-01-22 DIAGNOSIS — R8279 Other abnormal findings on microbiological examination of urine: Secondary | ICD-10-CM | POA: Diagnosis not present

## 2020-04-15 ENCOUNTER — Telehealth: Payer: Self-pay

## 2020-04-15 ENCOUNTER — Ambulatory Visit (INDEPENDENT_AMBULATORY_CARE_PROVIDER_SITE_OTHER): Payer: Medicare Other | Admitting: Internal Medicine

## 2020-04-15 ENCOUNTER — Encounter: Payer: Self-pay | Admitting: Internal Medicine

## 2020-04-15 ENCOUNTER — Other Ambulatory Visit: Payer: Self-pay

## 2020-04-15 VITALS — BP 150/70 | HR 64 | Temp 98.4°F | Ht 67.0 in | Wt 166.0 lb

## 2020-04-15 DIAGNOSIS — L259 Unspecified contact dermatitis, unspecified cause: Secondary | ICD-10-CM | POA: Insufficient documentation

## 2020-04-15 DIAGNOSIS — I10 Essential (primary) hypertension: Secondary | ICD-10-CM | POA: Diagnosis not present

## 2020-04-15 DIAGNOSIS — L253 Unspecified contact dermatitis due to other chemical products: Secondary | ICD-10-CM | POA: Diagnosis not present

## 2020-04-15 MED ORDER — METHYLPREDNISOLONE ACETATE 80 MG/ML IJ SUSP
80.0000 mg | Freq: Once | INTRAMUSCULAR | Status: AC
  Administered 2020-04-15: 80 mg via INTRAMUSCULAR

## 2020-04-15 MED ORDER — PREDNISONE 10 MG PO TABS: ORAL_TABLET | ORAL | 0 refills | Status: DC

## 2020-04-15 NOTE — Progress Notes (Signed)
Subjective:    Patient ID: [name] Allen, male    DOB: 27-Dec-1950, 69 y.o.   MRN: [id number]  HPI  Here to f/u with c/o itchy swelling to the scalp, left periorbital and left facial area x 2 days after using a topical hair growth spray he bought for over $200; denies fever, chills,  And Pt denies chest pain, increased sob or doe, wheezing, orthopnea, PND, increased LE swelling, palpitations, dizziness or syncope.  Pt denies new neurological symptoms such as new headache, or facial or extremity weakness or numbness   Pt denies polydipsia, polyuria Past Medical History:  Diagnosis Date  . Anxiety   . Coronary artery disease 2003   status post CABG 2005  //     [place] 03/26/12: Ostial LAD 30-40%, mid LAD 20%, circumflex with minor irregularities less than 10%, mid RCA 99%, LIMA-LAD patent but with significant competitive flow from the native LAD, EF 55-65%.  PCI: Evolve 2 study stent to the mid RCA.  . Depression ~ 2005  . Dysrhythmia 2003   irregular  . Ejection fraction    EF 55-65%, catheterization, July, 2013  . HLD (hyperlipidemia)   . Hypertension   . SOB (shortness of breath) on exertion    in the past/ had CABG   Past Surgical History:  Procedure Laterality Date  . CARDIAC SURGERY  2013  . CORONARY ANGIOPLASTY WITH STENT PLACEMENT  03/26/12   "1"  . CORONARY ARTERY BYPASS GRAFT  2003   CABG X1  . INGUINAL HERNIA REPAIR  ~ 2008   right  . LEFT HEART CATHETERIZATION WITH CORONARY/GRAFT ANGIOGRAM  03/26/2012   Procedure: LEFT HEART CATHETERIZATION WITH Beatrix Fetters;  Surgeon: Peter M Martinique, MD;  Location: [facility] CATH LAB;  Service: Cardiovascular;;  . PENILE PROSTHESIS IMPLANT N/A 09/11/2017   Procedure: PENILE PROTHESIS INFLATABLE AMS;  Surgeon: Lucas Mallow, MD;  Location: [facility];  Service: Urology;  Laterality: N/A;  . PERCUTANEOUS CORONARY STENT INTERVENTION (PCI-S)  03/26/2012   Procedure: PERCUTANEOUS CORONARY STENT INTERVENTION (PCI-S);  Surgeon:  Peter M Martinique, MD;  Location: [facility] CATH LAB;  Service: Cardiovascular;;    reports that he quit smoking about 18 years ago. His smoking use included cigarettes. He has a 3.00 pack-year smoking history. He has never used smokeless tobacco. He reports current alcohol use. He reports current drug use. Drug: Marijuana. family history includes Cancer in his father; Depression in his mother; Diabetes in his mother; Early death in his father and mother; Heart disease in his mother; Hyperlipidemia in his mother; Hypertension in his mother and sister; Kidney disease in his father; Prostate cancer in his father. No Known Allergies Current Outpatient Medications on File Prior to Visit  Medication Sig Dispense Refill  . aspirin 81 MG tablet Take 81 mg by mouth daily.    [name] atorvastatin (LIPITOR) 20 MG tablet TAKE 1 TABLET BY MOUTH EVERY DAY 90 tablet 2  . Multiple Vitamin (MULTIVITAMIN) tablet Take 1 tablet by mouth daily.     . tamsulosin (FLOMAX) 0.4 MG CAPS capsule Take 1 capsule (0.4 mg total) by mouth daily after supper. 14 capsule 0  . traZODone (DESYREL) 50 MG tablet TAKE 1 TABLET (50 MG TOTAL) AT BEDTIME BY MOUTH. 90 tablet 3  . potassium chloride SA (KLOR-CON) 20 MEQ tablet Take 2 tablets (40 mEq total) by mouth daily for 7 days. 14 tablet 0  . [DISCONTINUED] pantoprazole (PROTONIX) 40 MG tablet TAKE 1 TABLET BY MOUTH EVERY DAY (  Patient not taking: Reported on 08/29/2019) 90 tablet 1   No current facility-administered medications on file prior to visit.   Review of Systems All otherwise neg per pt    Objective:   Physical Exam BP (!) 150/70 (BP Location: Left Arm, Patient Position: Sitting, Cuff Size: Large)   Pulse 64   Temp 98.4 F (36.9 C) (Oral)   Ht 5\' 7"  (1.702 m)   Wt 166 lb (75.3 kg)   SpO2 96%   BMI 26.00 kg/m  VS noted,  Constitutional: Pt appears in NAD HENT: Head: NCAT.  Right Ear: External ear normal.  Left Ear: External ear normal.  Eyes: . Pupils are equal, round, and  reactive to light. Conjunctivae and EOM are normal Nose: without d/c or deformity Neck: Neck supple. Gross normal ROM Cardiovascular: Normal rate and regular rhythm.   Pulmonary/Chest: Effort normal and breath sounds without rales or wheezing.  Neurological: Pt is alert. At baseline orientation, motor grossly intact Skin: Skin is warm. + mild eythem rash and swelling to scalp, left periorbital and left upper facial area, no LE edema Psychiatric: Pt behavior is normal without agitation  All otherwise neg per pt Lab Results  Component Value Date   WBC 9.9 11/19/2019   HGB 13.5 11/19/2019   HCT 40.6 11/19/2019   PLT 403.0 (H) 11/19/2019   GLUCOSE 119 (H) 09/01/2019   CHOL 160 09/01/2019   TRIG 80.0 09/01/2019   HDL 27.20 (L) 09/01/2019   LDLCALC 116 (H) 09/01/2019   ALT 12 09/01/2019   AST 14 09/01/2019   NA 136 09/01/2019   K 3.1 (L) 09/01/2019   CL 98 09/01/2019   CREATININE 1.12 09/01/2019   BUN 13 09/01/2019   CO2 28 09/01/2019   TSH 0.769 02/29/2012   PSA 4.53 (H) 08/07/2017   INR 1.0 03/24/2012   HGBA1C 5.8 09/01/2019      Assessment & Plan:

## 2020-04-15 NOTE — Telephone Encounter (Signed)
[place] with me but must be ok with Dr Loletha Grayer

## 2020-04-15 NOTE — Telephone Encounter (Signed)
Patient stopping at check out and is requesting to transfer care to Dr Jenny Reichmann from Dr Sharlet Salina. States that he would prefer a male doctor. Please advise on whether this TOC is okay. Thanks

## 2020-04-15 NOTE — Patient Instructions (Signed)
You had the steroid shot today  Please take all new medication as prescribed - the prednisone  Please continue all other medications as before, and refills have been done if requested.  Please have the pharmacy call with any other refills you may need.  Please continue your efforts at being more active, low cholesterol diet, and weight control.  Please keep your appointments with your specialists as you may have planned   

## 2020-04-16 ENCOUNTER — Encounter: Payer: Self-pay | Admitting: Internal Medicine

## 2020-04-16 NOTE — Assessment & Plan Note (Addendum)
Mild to mod, for depomedrol iM 80, predpac asd,  to f/u any worsening symptoms or concerns  I spent 21 minutes in preparing to see the patient by review of recent labs, imaging and procedures, obtaining and reviewing separately obtained history, communicating with the patient and family or caregiver, ordering medications, tests or procedures, and documenting clinical information in the EHR including the differential Dx, treatment, and any further evaluation and other management of rash, htn

## 2020-04-16 NOTE — Assessment & Plan Note (Signed)
stable overall by history and exam, recent data reviewed with pt, and pt to continue medical treatment as before,  to f/u any worsening symptoms or concerns  

## 2020-04-18 NOTE — Telephone Encounter (Signed)
Fine with me

## 2020-05-04 ENCOUNTER — Encounter: Payer: Self-pay | Admitting: Internal Medicine

## 2020-05-04 ENCOUNTER — Ambulatory Visit (INDEPENDENT_AMBULATORY_CARE_PROVIDER_SITE_OTHER): Payer: Medicare Other | Admitting: Internal Medicine

## 2020-05-04 ENCOUNTER — Other Ambulatory Visit: Payer: Self-pay

## 2020-05-04 VITALS — BP 130/80 | HR 61 | Temp 98.2°F | Ht 67.0 in | Wt 165.0 lb

## 2020-05-04 DIAGNOSIS — E538 Deficiency of other specified B group vitamins: Secondary | ICD-10-CM

## 2020-05-04 DIAGNOSIS — Z0000 Encounter for general adult medical examination without abnormal findings: Secondary | ICD-10-CM

## 2020-05-04 DIAGNOSIS — E559 Vitamin D deficiency, unspecified: Secondary | ICD-10-CM

## 2020-05-04 DIAGNOSIS — R252 Cramp and spasm: Secondary | ICD-10-CM

## 2020-05-04 DIAGNOSIS — E7800 Pure hypercholesterolemia, unspecified: Secondary | ICD-10-CM | POA: Diagnosis not present

## 2020-05-04 DIAGNOSIS — I10 Essential (primary) hypertension: Secondary | ICD-10-CM | POA: Diagnosis not present

## 2020-05-04 DIAGNOSIS — Z0001 Encounter for general adult medical examination with abnormal findings: Secondary | ICD-10-CM

## 2020-05-04 DIAGNOSIS — R739 Hyperglycemia, unspecified: Secondary | ICD-10-CM

## 2020-05-04 DIAGNOSIS — R109 Unspecified abdominal pain: Secondary | ICD-10-CM

## 2020-05-04 MED ORDER — TIZANIDINE HCL 2 MG PO TABS: 2.0000 mg | ORAL_TABLET | Freq: Four times a day (QID) | ORAL | 2 refills | Status: DC | PRN

## 2020-05-04 NOTE — Assessment & Plan Note (Signed)
stable overall by history and exam, recent data reviewed with pt, and pt to continue medical treatment as before,  to f/u any worsening symptoms or concerns  

## 2020-05-04 NOTE — Assessment & Plan Note (Signed)
For lower chol diet, f/u lab

## 2020-05-04 NOTE — Assessment & Plan Note (Addendum)
etoilogy unclear, for labs, and tizanidien qhs prn  I spent 31 minutes in addition to time for CPX wellness examination in preparing to see the patient by review of recent labs, imaging and procedures, obtaining and reviewing separately obtained history, communicating with the patient and family or caregiver, ordering medications, tests or procedures, and documenting clinical information in the EHR including the differential Dx, treatment, and any further evaluation and other management of leg cramps, left flank pain, htn, hld

## 2020-05-04 NOTE — Assessment & Plan Note (Signed)
?   MSK most likely, for uA with labs, consider u/s vs ct if persists or worsens

## 2020-05-04 NOTE — Assessment & Plan Note (Signed)

## 2020-05-04 NOTE — Progress Notes (Signed)
Subjective:    Patient ID: [name] Allen, male    DOB: [birth year], 69 y.o.   MRN: [id number]  HPI   Here for wellness and f/u;  Overall doing ok;  Pt denies Chest pain, worsening SOB, DOE, wheezing, orthopnea, PND, worsening LE edema, palpitations, dizziness or syncope.  Pt denies neurological change such as new headache, facial or extremity weakness.  Pt denies polydipsia, polyuria, or low sugar symptoms. Pt states overall good compliance with treatment and medications, good tolerability, and has been trying to follow appropriate diet.  Pt denies worsening depressive symptoms, suicidal ideation or panic. No fever, night sweats, wt loss, loss of appetite, or other constitutional symptoms.  Pt states good ability with ADL's, has low fall risk, home safety reviewed and adequate, no other significant changes in hearing or vision, and only occasionally active with exercise. Also c/o nightly leg cramps moderate intermittent , not worse or better with anything, ongoing for several months, wakes him up Also c/o pain to left flank area, crampy but occasionally sharp, worse to turn at the waist of move the left arm and upper back, mild intermittent, nothing else makes better or worse, Denies urinary symptoms such as dysuria, frequency, urgency, flank pain, hematuria or n/v, fever, chills.  Denies worsening reflux, abd pain, dysphagia, n/v, bowel change or blood.   Past Medical History:  Diagnosis Date  . Anxiety   . Coronary artery disease 2003   status post CABG 2005  //     [place] 03/26/12: Ostial LAD 30-40%, mid LAD 20%, circumflex with minor irregularities less than 10%, mid RCA 99%, LIMA-LAD patent but with significant competitive flow from the native LAD, EF 55-65%.  PCI: Evolve 2 study stent to the mid RCA.  . Depression ~ 2005  . Dysrhythmia 2003   irregular  . Ejection fraction    EF 55-65%, catheterization, July, 2013  . HLD (hyperlipidemia)   . Hypertension   . SOB (shortness of breath) on exertion     in the past/ had CABG   Past Surgical History:  Procedure Laterality Date  . CARDIAC SURGERY  2013  . CORONARY ANGIOPLASTY WITH STENT PLACEMENT  03/26/12   "1"  . CORONARY ARTERY BYPASS GRAFT  2003   CABG X1  . INGUINAL HERNIA REPAIR  ~ 2008   right  . LEFT HEART CATHETERIZATION WITH CORONARY/GRAFT ANGIOGRAM  03/26/2012   Procedure: LEFT HEART CATHETERIZATION WITH Beatrix Fetters;  Surgeon: Peter M Martinique, MD;  Location: [facility] CATH LAB;  Service: Cardiovascular;;  . PENILE PROSTHESIS IMPLANT N/A 09/11/2017   Procedure: PENILE PROTHESIS INFLATABLE AMS;  Surgeon: Lucas Mallow, MD;  Location: [facility];  Service: Urology;  Laterality: N/A;  . PERCUTANEOUS CORONARY STENT INTERVENTION (PCI-S)  03/26/2012   Procedure: PERCUTANEOUS CORONARY STENT INTERVENTION (PCI-S);  Surgeon: Peter M Martinique, MD;  Location: [facility] CATH LAB;  Service: Cardiovascular;;    reports that he quit smoking about 18 years ago. His smoking use included cigarettes. He has a 3.00 pack-year smoking history. He has never used smokeless tobacco. He reports current alcohol use. He reports current drug use. Drug: Marijuana. family history includes Cancer in his father; Depression in his mother; Diabetes in his mother; Early death in his father and mother; Heart disease in his mother; Hyperlipidemia in his mother; Hypertension in his mother and sister; Kidney disease in his father; Prostate cancer in his father. No Known Allergies Current Outpatient Medications on File Prior to Visit  Medication Sig Dispense  Refill  . aspirin 81 MG tablet Take 81 mg by mouth daily.    [name] atorvastatin (LIPITOR) 20 MG tablet TAKE 1 TABLET BY MOUTH EVERY DAY 90 tablet 2  . Multiple Vitamin (MULTIVITAMIN) tablet Take 1 tablet by mouth daily.     . predniSONE (DELTASONE) 10 MG tablet 3 tabs by mouth per day for 3 days,2tabs per day for 3 days,1tab per day for 3 days 18 tablet 0  . tamsulosin (FLOMAX) 0.4 MG CAPS capsule Take 1  capsule (0.4 mg total) by mouth daily after supper. 14 capsule 0  . traZODone (DESYREL) 50 MG tablet TAKE 1 TABLET (50 MG TOTAL) AT BEDTIME BY MOUTH. 90 tablet 3  . potassium chloride SA (KLOR-CON) 20 MEQ tablet Take 2 tablets (40 mEq total) by mouth daily for 7 days. 14 tablet 0  . [DISCONTINUED] pantoprazole (PROTONIX) 40 MG tablet TAKE 1 TABLET BY MOUTH EVERY DAY (Patient not taking: Reported on 08/29/2019) 90 tablet 1   No current facility-administered medications on file prior to visit.   Review of Systems All otherwise neg per pt    Objective:   Physical Exam BP 130/80 (BP Location: Left Arm, Patient Position: Sitting, Cuff Size: Large)   Pulse 61   Temp 98.2 F (36.8 C) (Oral)   Ht 5\' 7"  (1.702 m)   Wt 165 lb (74.8 kg)   SpO2 96%   BMI 25.84 kg/m  VS noted,  Constitutional: Pt appears in NAD HENT: Head: NCAT.  Right Ear: External ear normal.  Left Ear: External ear normal.  Eyes: . Pupils are equal, round, and reactive to light. Conjunctivae and EOM are normal Nose: without d/c or deformity Neck: Neck supple. Gross normal ROM Cardiovascular: Normal rate and regular rhythm.   Pulmonary/Chest: Effort normal and breath sounds without rales or wheezing.  Abd:  Soft, NT, ND, + BS, no organomegaly, no flank tender or sweling or rash Neurological: Pt is alert. At baseline orientation, motor grossly intact Skin: Skin is warm. No rashes, other new lesions, no LE edema Psychiatric: Pt behavior is normal without agitation  All otherwise neg per pt Lab Results  Component Value Date   WBC 9.9 11/19/2019   HGB 13.5 11/19/2019   HCT 40.6 11/19/2019   PLT 403.0 (H) 11/19/2019   GLUCOSE 119 (H) 09/01/2019   CHOL 160 09/01/2019   TRIG 80.0 09/01/2019   HDL 27.20 (L) 09/01/2019   LDLCALC 116 (H) 09/01/2019   ALT 12 09/01/2019   AST 14 09/01/2019   NA 136 09/01/2019   K 3.1 (L) 09/01/2019   CL 98 09/01/2019   CREATININE 1.12 09/01/2019   BUN 13 09/01/2019   CO2 28 09/01/2019     TSH 0.769 02/29/2012   PSA 4.53 (H) 08/07/2017   INR 1.0 03/24/2012   HGBA1C 5.8 09/01/2019      Assessment & Plan:

## 2020-05-04 NOTE — Patient Instructions (Signed)
Please take all new medication as prescribed - the muscle relaxer as needed  Please continue all other medications as before, and refills have been done if requested.  Please have the pharmacy call with any other refills you may need.  Please continue your efforts at being more active, low cholesterol diet, and weight control.  You are otherwise up to date with prevention measures today.  Please keep your appointments with your specialists as you may have planned  Please go to the LAB at the blood drawing area for the tests to be done  You will be contacted by phone if any changes need to be made immediately.  Otherwise, you will receive a letter about your results with an explanation, but please check with MyChart first.  Please remember to sign up for MyChart if you have not done so, as this will be important to you in the future with finding out test results, communicating by private email, and scheduling acute appointments online when needed.  Please make an Appointment to return in 6 months, or sooner if needed 

## 2020-05-05 ENCOUNTER — Encounter: Payer: Self-pay | Admitting: Internal Medicine

## 2020-05-05 ENCOUNTER — Other Ambulatory Visit: Payer: Self-pay | Admitting: Internal Medicine

## 2020-05-05 DIAGNOSIS — R972 Elevated prostate specific antigen [PSA]: Secondary | ICD-10-CM

## 2020-05-05 LAB — CBC WITH DIFFERENTIAL/PLATELET
Absolute Monocytes: 1111 cells/uL — ABNORMAL HIGH (ref 200–950)
Basophils Absolute: 88 cells/uL (ref 0–200)
Basophils Relative: 0.8 %
Eosinophils Absolute: 341 cells/uL (ref 15–500)
Eosinophils Relative: 3.1 %
HCT: 43.9 % (ref 38.5–50.0)
Hemoglobin: 14.4 g/dL (ref 13.2–17.1)
Lymphs Abs: 3025 cells/uL (ref 850–3900)
MCH: 29.5 pg (ref 27.0–33.0)
MCHC: 32.8 g/dL (ref 32.0–36.0)
MCV: 90 fL (ref 80.0–100.0)
MPV: 9.9 fL (ref 7.5–12.5)
Monocytes Relative: 10.1 %
Neutro Abs: 6435 cells/uL (ref 1500–7800)
Neutrophils Relative %: 58.5 %
Platelets: 330 10*3/uL (ref 140–400)
RBC: 4.88 10*6/uL (ref 4.20–5.80)
RDW: 14.9 % (ref 11.0–15.0)
Total Lymphocyte: 27.5 %
WBC: 11 10*3/uL — ABNORMAL HIGH (ref 3.8–10.8)

## 2020-05-05 LAB — HEPATIC FUNCTION PANEL
AG Ratio: 1.4 (calc) (ref 1.0–2.5)
ALT: 24 U/L (ref 9–46)
AST: 23 U/L (ref 10–35)
Albumin: 4.2 g/dL (ref 3.6–5.1)
Alkaline phosphatase (APISO): 105 U/L (ref 35–144)
Bilirubin, Direct: 0.1 mg/dL (ref 0.0–0.2)
Globulin: 3.1 g/dL (calc) (ref 1.9–3.7)
Indirect Bilirubin: 0.5 mg/dL (calc) (ref 0.2–1.2)
Total Bilirubin: 0.6 mg/dL (ref 0.2–1.2)
Total Protein: 7.3 g/dL (ref 6.1–8.1)

## 2020-05-05 LAB — HEMOGLOBIN A1C
Hgb A1c MFr Bld: 5.9 % of total Hgb — ABNORMAL HIGH (ref <5.7)
Mean Plasma Glucose: 123 (calc)
eAG (mmol/L): 6.8 (calc)

## 2020-05-05 LAB — LIPID PANEL
Cholesterol: 173 mg/dL (ref <200)
HDL: 54 mg/dL (ref >=40)
LDL Cholesterol (Calc): 103 mg/dL (calc) — ABNORMAL HIGH
Non-HDL Cholesterol (Calc): 119 mg/dL (calc) (ref <130)
Total CHOL/HDL Ratio: 3.2 (calc) (ref <5.0)
Triglycerides: 71 mg/dL (ref <150)

## 2020-05-05 LAB — URINALYSIS, ROUTINE W REFLEX MICROSCOPIC
Bilirubin Urine: NEGATIVE
Glucose, UA: NEGATIVE
Hgb urine dipstick: NEGATIVE
Ketones, ur: NEGATIVE
Leukocytes,Ua: NEGATIVE
Nitrite: NEGATIVE
Protein, ur: NEGATIVE
Specific Gravity, Urine: 1.017 (ref 1.001–1.03)
pH: 5.5 (ref 5.0–8.0)

## 2020-05-05 LAB — VITAMIN D 25 HYDROXY (VIT D DEFICIENCY, FRACTURES): Vit D, 25-Hydroxy: 19 ng/mL — ABNORMAL LOW (ref 30–100)

## 2020-05-05 LAB — VITAMIN B12: Vitamin B-12: 893 pg/mL (ref 200–1100)

## 2020-05-05 LAB — PSA: PSA: 12.2 ng/mL — ABNORMAL HIGH (ref <=4.0)

## 2020-05-05 LAB — TSH: TSH: 1.08 mIU/L (ref 0.40–4.50)

## 2020-05-05 MED ORDER — VITAMIN D (ERGOCALCIFEROL) 1.25 MG (50000 UNIT) PO CAPS: 50000.0000 [IU] | ORAL_CAPSULE | ORAL | 0 refills | Status: DC

## 2020-05-17 ENCOUNTER — Other Ambulatory Visit: Payer: Self-pay

## 2020-05-17 ENCOUNTER — Encounter: Payer: Self-pay | Admitting: Internal Medicine

## 2020-05-17 ENCOUNTER — Ambulatory Visit (INDEPENDENT_AMBULATORY_CARE_PROVIDER_SITE_OTHER): Payer: Medicare Other | Admitting: Internal Medicine

## 2020-05-17 VITALS — BP 140/80 | HR 71 | Temp 98.3°F | Resp 16 | Ht 67.0 in | Wt 164.5 lb

## 2020-05-17 DIAGNOSIS — R222 Localized swelling, mass and lump, trunk: Secondary | ICD-10-CM

## 2020-05-17 DIAGNOSIS — B351 Tinea unguium: Secondary | ICD-10-CM | POA: Diagnosis not present

## 2020-05-17 MED ORDER — TERBINAFINE HCL 250 MG PO TABS: 250.0000 mg | ORAL_TABLET | Freq: Every day | ORAL | 0 refills | Status: DC

## 2020-05-17 NOTE — Patient Instructions (Signed)
Fungal Nail Infection A fungal nail infection is a common infection of the toenails or fingernails. This condition affects toenails more often than fingernails. It often affects the great, or big, toes. More than one nail may be infected. The condition can be passed from person to person (is contagious). What are the causes? This condition is caused by a fungus. Several types of fungi can cause the infection. These fungi are common in moist and warm areas. If your hands or feet come into contact with the fungus, it may get into a crack in your fingernail or toenail and cause the infection. What increases the risk? The following factors may make you more likely to develop this condition:  Being male.  Being of older age.  Living with someone who has the fungus.  Walking barefoot in areas where the fungus thrives, such as showers or locker rooms.  Wearing shoes and socks that cause your feet to sweat.  Having a nail injury or a recent nail surgery.  Having certain medical conditions, such as: ? Athlete's foot. ? Diabetes. ? Psoriasis. ? Poor circulation. ? A weak body defense system (immune system). What are the signs or symptoms? Symptoms of this condition include:  A pale spot on the nail.  Thickening of the nail.  A nail that becomes yellow or brown.  A brittle or ragged nail edge.  A crumbling nail.  A nail that has lifted away from the nail bed. How is this diagnosed? This condition is diagnosed with a physical exam. Your health care provider may take a scraping or clipping from your nail to test for the fungus. How is this treated? Treatment is not needed for mild infections. If you have significant nail changes, treatment may include:  Antifungal medicines taken by mouth (orally). You may need to take the medicine for several weeks or several months, and you may not see the results for a long time. These medicines can cause side effects. Ask your health care provider  what problems to watch for.  Antifungal nail polish or nail cream. These may be used along with oral antifungal medicines.  Laser treatment of the nail.  Surgery to remove the nail. This may be needed for the most severe infections. It can take a long time, usually up to a year, for the infection to go away. The infection may also come back. Follow these instructions at home: Medicines  Take or apply over-the-counter and prescription medicines only as told by your health care provider.  Ask your health care provider about using over-the-counter mentholated ointment on your nails. Nail care  Trim your nails often.  Wash and dry your hands and feet every day.  Keep your feet dry: ? Wear absorbent socks, and change your socks frequently. ? Wear shoes that allow air to circulate, such as sandals or canvas tennis shoes. Throw out old shoes.  Do not use artificial nails.  If you go to a nail salon, make sure you choose one that uses clean instruments.  Use antifungal foot powder on your feet and in your shoes. General instructions  Do not share personal items, such as towels or nail clippers.  Do not walk barefoot in shower rooms or locker rooms.  Wear rubber gloves if you are working with your hands in wet areas.  Keep all follow-up visits as told by your health care provider. This is important. Contact a health care provider if: Your infection is not getting better or it is getting worse   after several months. Summary  A fungal nail infection is a common infection of the toenails or fingernails.  Treatment is not needed for mild infections. If you have significant nail changes, treatment may include taking medicine orally and applying medicine to your nails.  It can take a long time, usually up to a year, for the infection to go away. The infection may also come back.  Take or apply over-the-counter and prescription medicines only as told by your health care  provider.  Follow instructions for taking care of your nails to help prevent infection from coming back or spreading. This information is not intended to replace advice given to you by your health care provider. Make sure you discuss any questions you have with your health care provider. Document Revised: 01/01/2019 Document Reviewed: 02/14/2018 [employer] Patient Education  2020 [employer]  

## 2020-05-17 NOTE — Progress Notes (Addendum)
Subjective:  Patient ID: [name] Allen, male    DOB: [birth year]  Age: 69 y.o. MRN: [id number]  CC: Follow-up  This visit occurred during the SARS-CoV-2 public health emergency.  Safety protocols were in place, including screening questions prior to the visit, additional usage of staff PPE, and extensive cleaning of exam room while observing appropriate contact time as indicated for disinfecting solutions.    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 There is pain in the area (cramping). He also complains of uncomfortable and abnormal toenails. He wants to have this treated.  Outpatient Medications Prior to Visit  Medication Sig Dispense Refill  . aspirin 81 MG tablet Take 81 mg by mouth daily.    [name] atorvastatin (LIPITOR) 20 MG tablet TAKE 1 TABLET BY MOUTH EVERY DAY 90 tablet 2  . Multiple Vitamin (MULTIVITAMIN) tablet Take 1 tablet by mouth daily.     . predniSONE (DELTASONE) 10 MG tablet 3 tabs by mouth per day for 3 days,2tabs per day for 3 days,1tab per day for 3 days 18 tablet 0  . traZODone (DESYREL) 50 MG tablet TAKE 1 TABLET (50 MG TOTAL) AT BEDTIME BY MOUTH. 90 tablet 3  . Vitamin D, Ergocalciferol, (DRISDOL) 1.25 MG (50000 UNIT) CAPS capsule Take 1 capsule (50,000 Units total) by mouth every 7 (seven) days. 12 capsule 0  . potassium chloride SA (KLOR-CON) 20 MEQ tablet Take 2 tablets (40 mEq total) by mouth daily for 7 days. 14 tablet 0  . tamsulosin (FLOMAX) 0.4 MG CAPS capsule Take 1 capsule (0.4 mg total) by mouth daily after supper. (Patient not taking: Reported on 05/17/2020) 14 capsule 0  . tiZANidine (ZANAFLEX) 2 MG tablet Take 1 tablet (2 mg total) by mouth every 6 (six) hours as needed for muscle spasms. 40 tablet 2   No facility-administered medications prior to visit.    ROS Review of Systems  Constitutional: Negative.  Negative for appetite change,  diaphoresis, fatigue and unexpected weight change.  HENT: Negative.   Eyes: Negative for visual disturbance.  Respiratory: Negative for cough, chest tightness, shortness of breath and wheezing.   Cardiovascular: Positive for chest pain. Negative for palpitations and leg swelling.  Gastrointestinal: Negative for abdominal pain, constipation, diarrhea, nausea and vomiting.  Endocrine: Negative.   Genitourinary: Negative.  Negative for difficulty urinating.  Musculoskeletal: Negative.  Negative for arthralgias.  Skin: Negative.  Negative for color change.  Neurological: Negative.  Negative for dizziness, weakness and headaches.  Hematological: Negative for adenopathy. Does not bruise/bleed easily.  Psychiatric/Behavioral: Negative.     Objective:  BP 140/80 (BP Location: Left Arm, Patient Position: Sitting, Cuff Size: Normal)   Pulse 71   Temp 98.3 F (36.8 C) (Oral)   Resp 16   Ht 5\' 7"  (1.702 m)   Wt 164 lb 8 oz (74.6 kg)   SpO2 98%   BMI 25.76 kg/m   BP Readings from Last 3 Encounters:  05/17/20 140/80  05/04/20 130/80  04/15/20 (!) 150/70    Wt Readings from Last 3 Encounters:  05/17/20 164 lb 8 oz (74.6 kg)  05/04/20 165 lb (74.8 kg)  04/15/20 166 lb (75.3 kg)    Physical Exam Vitals reviewed.  HENT:     Nose: Nose normal.     Mouth/Throat:     Mouth: Mucous membranes are moist.  Eyes:     General:  No scleral icterus.    Conjunctiva/sclera: Conjunctivae normal.  Cardiovascular:     Rate and Rhythm: Normal rate and regular rhythm.     Pulses:          Dorsalis pedis pulses are 1+ on the right side and 1+ on the left side.       Posterior tibial pulses are 0 on the right side and 0 on the left side.     Heart sounds: No murmur heard.   Pulmonary:     Effort: Pulmonary effort is normal.     Breath sounds: No stridor. No wheezing, rhonchi or rales.  Chest:     Chest wall: Mass present. No deformity, tenderness or edema.    Musculoskeletal:     Cervical  back: Neck supple.  Feet:     Right foot:     Skin integrity: Dry skin present.     Toenail Condition: Right toenails are abnormally thick. Fungal disease present.    Left foot:     Skin integrity: Dry skin present.     Toenail Condition: Left toenails are abnormally thick. Fungal disease present. Lymphadenopathy:     Cervical: No cervical adenopathy.  Neurological:     Mental Status: He is alert.     Lab Results  Component Value Date   WBC 11.0 (H) 05/04/2020   HGB 14.4 05/04/2020   HCT 43.9 05/04/2020   PLT 330 05/04/2020   GLUCOSE 119 (H) 09/01/2019   CHOL 173 05/04/2020   TRIG 71 05/04/2020   HDL 54 05/04/2020   LDLCALC 103 (H) 05/04/2020   ALT 24 05/04/2020   AST 23 05/04/2020   NA 136 09/01/2019   K 3.1 (L) 09/01/2019   CL 98 09/01/2019   CREATININE 1.12 09/01/2019   BUN 13 09/01/2019   CO2 28 09/01/2019   TSH 1.08 05/04/2020   PSA 12.2 (H) 05/04/2020   INR 1.0 03/24/2012   HGBA1C 5.9 (H) 05/04/2020    No results found.  Assessment & Plan:   [name] Allen was seen today for follow-up.  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 I have ordered an MRI to see if there is a malignant process. -     MR CHEST W CONTRAST; Future  Onychomycosis of toenail- His recent liver enzymes were normal. Will start terbinafine. He was made aware of possible side effects. I have asked him to return in 3 to 4 weeks to have his liver enzymes rechecked. -     terbinafine (LAMISIL) 250 MG tablet; Take 1 tablet (250 mg total) by mouth daily.   I have discontinued Nikki Dom. Rowlette's tiZANidine. I am also having him start on terbinafine. Additionally, I am having him maintain his aspirin, multivitamin, tamsulosin, potassium chloride SA, atorvastatin, traZODone, predniSONE, and Vitamin D (Ergocalciferol).  Meds ordered this encounter  Medications  . terbinafine (LAMISIL) 250 MG tablet    Sig: Take 1 tablet (250 mg total) by mouth daily.     Dispense:  30 tablet    Refill:  0     Follow-up: Return in about 4 weeks (around 06/14/2020).  Scarlette Calico, MD

## 2020-05-19 ENCOUNTER — Other Ambulatory Visit: Payer: Self-pay | Admitting: Internal Medicine

## 2020-05-19 DIAGNOSIS — Z77018 Contact with and (suspected) exposure to other hazardous metals: Secondary | ICD-10-CM

## 2020-06-13 ENCOUNTER — Other Ambulatory Visit: Payer: Self-pay

## 2020-06-13 ENCOUNTER — Other Ambulatory Visit: Payer: Medicare Other

## 2020-06-13 ENCOUNTER — Ambulatory Visit
Admission: RE | Admit: 2020-06-13 | Discharge: 2020-06-13 | Disposition: A | Discharge status: Home / Self Care | Payer: Medicare Other | Source: Ambulatory Visit | Attending: Internal Medicine | Admitting: Internal Medicine

## 2020-06-13 ENCOUNTER — Ambulatory Visit
Admission: RE | Admit: 2020-06-13 | Discharge: 2020-06-13 | Disposition: A | Discharge status: Home / Self Care | Payer: 59 | Source: Ambulatory Visit | Attending: Internal Medicine | Admitting: Internal Medicine

## 2020-06-13 DIAGNOSIS — R222 Localized swelling, mass and lump, trunk: Secondary | ICD-10-CM

## 2020-06-13 DIAGNOSIS — Z77018 Contact with and (suspected) exposure to other hazardous metals: Secondary | ICD-10-CM

## 2020-06-13 MED ORDER — GADOBENATE DIMEGLUMINE 529 MG/ML IV SOLN
15.0000 mL | Freq: Once | INTRAVENOUS | Status: AC | PRN
  Administered 2020-06-13: 15 mL via INTRAVENOUS

## 2020-06-16 ENCOUNTER — Other Ambulatory Visit: Payer: Self-pay | Admitting: Internal Medicine

## 2020-06-21 ENCOUNTER — Ambulatory Visit (INDEPENDENT_AMBULATORY_CARE_PROVIDER_SITE_OTHER): Payer: 59 | Admitting: Internal Medicine

## 2020-06-21 ENCOUNTER — Encounter: Payer: Self-pay | Admitting: Internal Medicine

## 2020-06-21 ENCOUNTER — Other Ambulatory Visit: Payer: Self-pay

## 2020-06-21 VITALS — BP 110/70 | HR 61 | Temp 98.6°F | Ht 67.0 in | Wt 164.0 lb

## 2020-06-21 DIAGNOSIS — I10 Essential (primary) hypertension: Secondary | ICD-10-CM

## 2020-06-21 DIAGNOSIS — R972 Elevated prostate specific antigen [PSA]: Secondary | ICD-10-CM

## 2020-06-21 DIAGNOSIS — Z23 Encounter for immunization: Secondary | ICD-10-CM

## 2020-06-21 DIAGNOSIS — B351 Tinea unguium: Secondary | ICD-10-CM

## 2020-06-21 DIAGNOSIS — R222 Localized swelling, mass and lump, trunk: Secondary | ICD-10-CM

## 2020-06-21 DIAGNOSIS — L609 Nail disorder, unspecified: Secondary | ICD-10-CM | POA: Diagnosis not present

## 2020-06-21 NOTE — Progress Notes (Addendum)
Subjective:    Patient ID: [name] Allen, male    DOB: Jan 31, 1951, 69 y.o.   MRN: [id number]  HPI  Here to f/u; overall doing ok,  Pt denies chest pain, increasing sob or doe, wheezing, orthopnea, PND, increased LE swelling, palpitations, dizziness or syncope.  Pt denies new neurological symptoms such as new headache, or facial or extremity weakness or numbness.  Pt denies polydipsia, polyuria, or low sugar episode.  Pt states overall good compliance with meds, mostly trying to follow appropriate diet, with wt overall stable,  but little exercise however.  Denies urinary symptoms such as dysuria, frequency, urgency, flank pain, hematuria or n/v, fever, chills. Wt Readings from Last 3 Encounters:  06/21/20 164 lb (74.4 kg)  05/17/20 164 lb 8 oz (74.6 kg)  05/04/20 165 lb (74.8 kg)  had MRI chest- neg for rib or other mass.  Not taking vit d. Also has nail deformity and pain, asks for podiatry.  Has seen urology - for prostat bx oct 8  Past Medical History:  Diagnosis Date  . Anxiety   . Coronary artery disease 2003   status post CABG 2005  //     [place] 03/26/12: Ostial LAD 30-40%, mid LAD 20%, circumflex with minor irregularities less than 10%, mid RCA 99%, LIMA-LAD patent but with significant competitive flow from the native LAD, EF 55-65%.  PCI: Evolve 2 study stent to the mid RCA.  . Depression ~ 2005  . Dysrhythmia 2003   irregular  . Ejection fraction    EF 55-65%, catheterization, July, 2013  . HLD (hyperlipidemia)   . Hypertension   . SOB (shortness of breath) on exertion    in the past/ had CABG   Past Surgical History:  Procedure Laterality Date  . CARDIAC SURGERY  2013  . CORONARY ANGIOPLASTY WITH STENT PLACEMENT  03/26/12   "1"  . CORONARY ARTERY BYPASS GRAFT  2003   CABG X1  . INGUINAL HERNIA REPAIR  ~ 2008   right  . LEFT HEART CATHETERIZATION WITH CORONARY/GRAFT ANGIOGRAM  03/26/2012   Procedure: LEFT HEART CATHETERIZATION WITH Beatrix Fetters;  Surgeon: Peter M  Martinique, MD;  Location: [facility] CATH LAB;  Service: Cardiovascular;;  . PENILE PROSTHESIS IMPLANT N/A 09/11/2017   Procedure: PENILE PROTHESIS INFLATABLE AMS;  Surgeon: Lucas Mallow, MD;  Location: [facility];  Service: Urology;  Laterality: N/A;  . PERCUTANEOUS CORONARY STENT INTERVENTION (PCI-S)  03/26/2012   Procedure: PERCUTANEOUS CORONARY STENT INTERVENTION (PCI-S);  Surgeon: Peter M Martinique, MD;  Location: [facility] CATH LAB;  Service: Cardiovascular;;    reports that he quit smoking about 19 years ago. His smoking use included cigarettes. He has a 3.00 pack-year smoking history. He has never used smokeless tobacco. He reports current alcohol use. He reports current drug use. Drug: Marijuana. family history includes Cancer in his father; Depression in his mother; Diabetes in his mother; Early death in his father and mother; Heart disease in his mother; Hyperlipidemia in his mother; Hypertension in his mother and sister; Kidney disease in his father; Prostate cancer in his father. No Known Allergies Current Outpatient Medications on File Prior to Visit  Medication Sig Dispense Refill  . aspirin 81 MG tablet Take 81 mg by mouth daily.    [name] atorvastatin (LIPITOR) 20 MG tablet TAKE 1 TABLET BY MOUTH EVERY DAY 90 tablet 2  . Multiple Vitamin (MULTIVITAMIN) tablet Take 1 tablet by mouth daily.     . predniSONE (DELTASONE) 10 MG tablet  3 tabs by mouth per day for 3 days,2tabs per day for 3 days,1tab per day for 3 days 18 tablet 0  . terbinafine (LAMISIL) 250 MG tablet Take 1 tablet (250 mg total) by mouth daily. 30 tablet 0  . traZODone (DESYREL) 50 MG tablet TAKE 1 TABLET (50 MG TOTAL) AT BEDTIME BY MOUTH. 90 tablet 3  . Vitamin D, Ergocalciferol, (DRISDOL) 1.25 MG (50000 UNIT) CAPS capsule Take 1 capsule (50,000 Units total) by mouth every 7 (seven) days. 12 capsule 0  . potassium chloride SA (KLOR-CON) 20 MEQ tablet Take 2 tablets (40 mEq total) by mouth daily for 7 days. 14 tablet 0  .  [DISCONTINUED] pantoprazole (PROTONIX) 40 MG tablet TAKE 1 TABLET BY MOUTH EVERY DAY (Patient not taking: Reported on 08/29/2019) 90 tablet 1   No current facility-administered medications on file prior to visit.   Review of Systems All otherwise neg per pt    Objective:   Physical Exam BP 110/70 (BP Location: Left Arm, Patient Position: Sitting, Cuff Size: Large)   Pulse 61   Temp 98.6 F (37 C) (Oral)   Ht 5\' 7"  (1.702 m)   Wt 164 lb (74.4 kg)   SpO2 97%   BMI 25.69 kg/m  VS noted,  Constitutional: Pt appears in NAD HENT: Head: NCAT.  Right Ear: External ear normal.  Left Ear: External ear normal.  Eyes: . Pupils are equal, round, and reactive to light. Conjunctivae and EOM are normal Nose: without d/c or deformity Neck: Neck supple. Gross normal ROM Cardiovascular: Normal rate and regular rhythm.   Pulmonary/Chest: Effort normal and breath sounds without rales or wheezing.  Abd:  Soft, NT, ND, + BS, no organomegaly Neurological: Pt is alert. At baseline orientation, motor grossly intact Skin: Skin is warm. No rashes, other new lesions, no LE edema Psychiatric: Pt behavior is normal without agitation  All otherwise neg per pt Lab Results  Component Value Date   WBC 11.0 (H) 05/04/2020   HGB 14.4 05/04/2020   HCT 43.9 05/04/2020   PLT 330 05/04/2020   GLUCOSE 119 (H) 09/01/2019   CHOL 173 05/04/2020   TRIG 71 05/04/2020   HDL 54 05/04/2020   LDLCALC 103 (H) 05/04/2020   ALT 24 05/04/2020   AST 23 05/04/2020   NA 136 09/01/2019   K 3.1 (L) 09/01/2019   CL 98 09/01/2019   CREATININE 1.12 09/01/2019   BUN 13 09/01/2019   CO2 28 09/01/2019   TSH 1.08 05/04/2020   PSA 12.2 (H) 05/04/2020   INR 1.0 03/24/2012   HGBA1C 5.9 (H) 05/04/2020      Assessment & Plan:

## 2020-06-21 NOTE — Patient Instructions (Addendum)
You had the flu shot today  Please continue all other medications as before, and refills have been done if requested.  Please have the pharmacy call with any other refills you may need.  Please continue your efforts at being more active, low cholesterol diet, and weight control.  Please keep your appointments with your specialists as you may have planned - the oct 8 prostate biopsy  You will be contacted regarding the referral for: podiatry (foot doctor)  Please make an Appointment to return in 6 months, or sooner if needed

## 2020-06-26 ENCOUNTER — Encounter: Payer: Self-pay | Admitting: Internal Medicine

## 2020-06-26 NOTE — Assessment & Plan Note (Signed)
For prostate bx oct 8, stable overall by history and exam, recent data reviewed with pt, and pt to continue medical treatment as before,  to f/u any worsening symptoms or concerns

## 2020-06-26 NOTE — Assessment & Plan Note (Signed)
[place] for podiatry referral,  to f/u any worsening symptoms or concerns

## 2020-06-26 NOTE — Assessment & Plan Note (Signed)
stable overall by history and exam, recent data reviewed with pt, and pt to continue medical treatment as before,  to f/u any worsening symptoms or concerns  

## 2020-06-26 NOTE — Assessment & Plan Note (Addendum)
None, MRI neg,  to f/u any worsening symptoms or concerns  I spent 31 minutes in preparing to see the patient by review of recent labs, imaging and procedures, obtaining and reviewing separately obtained history, communicating with the patient and family or caregiver, ordering medications, tests or procedures, and documenting clinical information in the EHR including the differential Dx, treatment, and any further evaluation and other management of mass right chest all, onychomycosis, htn, elevated psa

## 2020-07-05 ENCOUNTER — Ambulatory Visit (INDEPENDENT_AMBULATORY_CARE_PROVIDER_SITE_OTHER): Payer: 59 | Admitting: Podiatry

## 2020-07-05 ENCOUNTER — Encounter: Payer: Self-pay | Admitting: Podiatry

## 2020-07-05 ENCOUNTER — Other Ambulatory Visit: Payer: Self-pay

## 2020-07-05 DIAGNOSIS — M79676 Pain in unspecified toe(s): Secondary | ICD-10-CM

## 2020-07-05 DIAGNOSIS — L603 Nail dystrophy: Secondary | ICD-10-CM

## 2020-07-05 DIAGNOSIS — B351 Tinea unguium: Secondary | ICD-10-CM

## 2020-07-05 NOTE — Progress Notes (Signed)
Subjective:  Patient ID: [name] Allen, male    DOB: 06-17-1951,  MRN: [id number] HPI Chief Complaint  Patient presents with  . Nail Problem    Toenails - thick and discolored x years, can't cut himself, PCP Rx'd terbinafine x 30 days, tried OTC meds-no help  . New Patient (Initial Visit)    69 y.o. male presents with the above complaint.   ROS: Denies fever chills nausea vomiting muscle aches pains calf pain back pain chest pain shortness of breath.  Past Medical History:  Diagnosis Date  . Anxiety   . Coronary artery disease 2003   status post CABG 2005  //     [place] 03/26/12: Ostial LAD 30-40%, mid LAD 20%, circumflex with minor irregularities less than 10%, mid RCA 99%, LIMA-LAD patent but with significant competitive flow from the native LAD, EF 55-65%.  PCI: Evolve 2 study stent to the mid RCA.  . Depression ~ 2005  . Dysrhythmia 2003   irregular  . Ejection fraction    EF 55-65%, catheterization, July, 2013  . HLD (hyperlipidemia)   . Hypertension   . SOB (shortness of breath) on exertion    in the past/ had CABG   Past Surgical History:  Procedure Laterality Date  . CARDIAC SURGERY  2013  . CORONARY ANGIOPLASTY WITH STENT PLACEMENT  03/26/12   "1"  . CORONARY ARTERY BYPASS GRAFT  2003   CABG X1  . INGUINAL HERNIA REPAIR  ~ 2008   right  . LEFT HEART CATHETERIZATION WITH CORONARY/GRAFT ANGIOGRAM  03/26/2012   Procedure: LEFT HEART CATHETERIZATION WITH Beatrix Fetters;  Surgeon: Peter M Martinique, MD;  Location: [facility] CATH LAB;  Service: Cardiovascular;;  . PENILE PROSTHESIS IMPLANT N/A 09/11/2017   Procedure: PENILE PROTHESIS INFLATABLE AMS;  Surgeon: Lucas Mallow, MD;  Location[facility];  Service: Urology;  Laterality: N/A;  . PERCUTANEOUS CORONARY STENT INTERVENTION (PCI-S)  03/26/2012   Procedure: PERCUTANEOUS CORONARY STENT INTERVENTION (PCI-S);  Surgeon: Peter M Martinique, MD;  Location: [facility] CATH LAB;  Service: Cardiovascular;;    Current  Outpatient Medications:  .  aspirin 81 MG tablet, Take 81 mg by mouth daily., Disp: , Rfl:  .  atorvastatin (LIPITOR) 20 MG tablet, TAKE 1 TABLET BY MOUTH EVERY DAY, Disp: 90 tablet, Rfl: 2 .  Multiple Vitamin (MULTIVITAMIN) tablet, Take 1 tablet by mouth daily. , Disp: , Rfl:  .  potassium chloride SA (KLOR-CON) 20 MEQ tablet, Take 2 tablets (40 mEq total) by mouth daily for 7 days., Disp: 14 tablet, Rfl: 0 .  predniSONE (DELTASONE) 10 MG tablet, 3 tabs by mouth per day for 3 days,2tabs per day for 3 days,1tab per day for 3 days, Disp: 18 tablet, Rfl: 0 .  traZODone (DESYREL) 50 MG tablet, TAKE 1 TABLET (50 MG TOTAL) AT BEDTIME BY MOUTH., Disp: 90 tablet, Rfl: 3 .  Vitamin D, Ergocalciferol, (DRISDOL) 1.25 MG (50000 UNIT) CAPS capsule, Take 1 capsule (50,000 Units total) by mouth every 7 (seven) days., Disp: 12 capsule, Rfl: 0  No Known Allergies Review of Systems Objective:  There were no vitals filed for this visit.  General: Well developed, nourished, in no acute distress, alert and oriented x3   Dermatological: Skin is warm, dry and supple bilateral. Nails x 10 are thick yellow dystrophic-like mycotic painful palpation as well as debridement; remaining integument appears unremarkable at this time. There are no open sores, no preulcerative lesions, no rash or signs of infection present.  His toenails  are thick yellow dystrophic clinically mycotic.  Vascular: Dorsalis Pedis artery and Posterior Tibial artery pedal pulses are 2/4 bilateral with immedate capillary fill time. Pedal hair growth present. No varicosities and no lower extremity edema present bilateral.   Neruologic: Grossly intact via light touch bilateral. Vibratory intact via tuning fork bilateral. Protective threshold with Semmes Wienstein monofilament intact to all pedal sites bilateral. Patellar and Achilles deep tendon reflexes 2+ bilateral. No Babinski or clonus noted bilateral.   Musculoskeletal: No gross boney pedal  deformities bilateral. No pain, crepitus, or limitation noted with foot and ankle range of motion bilateral. Muscular strength 5/5 in all groups tested bilateral.  Gait: Unassisted, Nonantalgic.    Radiographs:  None taken  Assessment & Plan:   Assessment: Pain in limb secondary to onychomycosis nail dystrophy.    Plan: Samples of the skin and nail today to be sent for pathologic evaluation also debrided the remainder of the nails.     Ledger Heindl T. [place], [place]

## 2020-07-28 ENCOUNTER — Other Ambulatory Visit: Payer: Self-pay | Admitting: Internal Medicine

## 2020-08-16 ENCOUNTER — Other Ambulatory Visit: Payer: Self-pay

## 2020-08-16 ENCOUNTER — Encounter: Payer: Self-pay | Admitting: Podiatry

## 2020-08-16 ENCOUNTER — Ambulatory Visit (INDEPENDENT_AMBULATORY_CARE_PROVIDER_SITE_OTHER): Payer: 59 | Admitting: Podiatry

## 2020-08-16 DIAGNOSIS — L603 Nail dystrophy: Secondary | ICD-10-CM

## 2020-08-16 MED ORDER — TERBINAFINE HCL 250 MG PO TABS
250.0000 mg | ORAL_TABLET | Freq: Every day | ORAL | 0 refills | Status: DC
Start: 2020-08-16 — End: 2020-11-08

## 2020-08-16 NOTE — Progress Notes (Signed)
He presents today for follow-up of his pathology results regarding his toenails.  Objective: Vital signs are stable he is alert oriented x3 pathology comes back as positive for onychodystrophy for onychomycosis and separate fights.  Assessment: Onychomycosis and onychodystrophy.  Plan: At this point we discussed laser therapy as well as oral therapy at this point he would like to consider oral therapy.  Has previously had liver profile performed August of this year which looks perfectly normal.  We did discuss pros and cons of use of this oral medications for possible side effects and complications associated with that he understands the risks and we will go ahead and start this.  I provided him with 30 tablets 1 p.o. daily of terbinafine 250 mg tablet.  I will follow-up with him in 30 days at which time we will discuss any side effects that he may have had as well as request another liver profile.  At that time we will prescribe him 90 pills.

## 2020-08-16 NOTE — Patient Instructions (Signed)
Terbinafine tablets What is this medicine? TERBINAFINE (TER bin a feen) is an antifungal medicine. It is used to treat certain kinds of fungal or yeast infections. This medicine may be used for other purposes; ask your health care provider or pharmacist if you have questions. COMMON BRAND NAME(S): Lamisil, Terbinex What should I tell my health care provider before I take this medicine? They need to know if you have any of these conditions:  drink alcoholic beverages  kidney disease  liver disease  an unusual or allergic reaction to terbinafine, other medicines, foods, dyes, or preservatives  pregnant or trying to get pregnant  breast-feeding How should I use this medicine? Take this medicine by mouth with a full glass of water. Follow the directions on the prescription label. You can take this medicine with food or on an empty stomach. Take your medicine at regular intervals. Do not take your medicine more often than directed. Do not skip doses or stop your medicine early even if you feel better. Do not stop taking except on your doctor's advice. Talk to your pediatrician regarding the use of this medicine in children. Special care may be needed. Overdosage: If you think you have taken too much of this medicine contact a poison control center or emergency room at once. NOTE: This medicine is only for you. Do not share this medicine with others. What if I miss a dose? If you miss a dose, take it as soon as you can. If it is almost time for your next dose, take only that dose. Do not take double or extra doses. What may interact with this medicine? Do not take this medicine with any of the following medications:  thioridazine This medicine may also interact with the following medications:  beta-blockers  caffeine  cimetidine  cyclosporine  medicines for depression, anxiety, or psychotic disturbances  medicines for fungal infections like fluconazole and ketoconazole  medicines  for irregular heartbeat like amiodarone, flecainide and propafenone  rifampin  warfarin This list may not describe all possible interactions. Give your health care provider a list of all the medicines, herbs, non-prescription drugs, or dietary supplements you use. Also tell them if you smoke, drink alcohol, or use illegal drugs. Some items may interact with your medicine. What should I watch for while using this medicine? Visit your doctor or health care provider regularly. Tell your doctor right away if you have nausea or vomiting, loss of appetite, stomach pain on your right upper side, yellow skin, dark urine, light stools, or are over tired. Some fungal infections need many weeks or months of treatment to cure. If you are taking this medicine for a long time, you will need to have important blood work done. This medicine may cause serious skin reactions. They can happen weeks to months after starting the medicine. Contact your health care provider right away if you notice fevers or flu-like symptoms with a rash. The rash may be red or purple and then turn into blisters or peeling of the skin. Or, you might notice a red rash with swelling of the face, lips or lymph nodes in your neck or under your arms. What side effects may I notice from receiving this medicine? Side effects that you should report to your doctor or health care professional as soon as possible:  allergic reactions like skin rash or hives, swelling of the face, lips, or tongue  changes in vision  dark urine  fever or infection  general ill feeling or flu-like symptoms    light-colored stools  loss of appetite, nausea  rash, fever, and swollen lymph nodes  redness, blistering, peeling or loosening of the skin, including inside the mouth  right upper belly pain  unusually weak or tired  yellowing of the eyes or skin Side effects that usually do not require medical attention (report to your doctor or health care  professional if they continue or are bothersome):  changes in taste  diarrhea  hair loss  muscle or joint pain  stomach gas  stomach upset This list may not describe all possible side effects. Call your doctor for medical advice about side effects. You may report side effects to FDA at 1-800-FDA-1088. Where should I keep my medicine? Keep out of the reach of children. Store at room temperature below 25 degrees C (77 degrees F). Protect from light. Throw away any unused medicine after the expiration date. NOTE: This sheet is a summary. It may not cover all possible information. If you have questions about this medicine, talk to your doctor, pharmacist, or health care provider.  2020 Elsevier/Gold Standard (2018-12-19 15:37:07)  

## 2020-08-25 ENCOUNTER — Encounter: Payer: Self-pay | Admitting: Radiation Oncology

## 2020-08-25 NOTE — Progress Notes (Signed)
GU Location of Tumor / Histology: prostatic adenocarcinoma  If Prostate Cancer, Gleason Score is (3 + 4) and PSA is (7.02). Prostate volume:  [name] Allen reports the first time he was told his PSA was elevated was November 2018 (4.18). However, there is record his psa was 3.40 in September 2016. Patient confirms his paternal grandfather suffered from prostate cancer.   Biopsies of prostate (if applicable) revealed:   Past/Anticipated interventions by urology, if any: prescribed tamsulosin, prostate biopsy, referral to Dr. Tammi Klippel for consideration of brachytherapy  Past/Anticipated interventions by medical oncology, if any: no  Weight changes, if any: no  Bowel/Bladder complaints, if any: IPSS 1 with nocturia. SHIM 15. Denies dysuria, hematuria, urinary leakage or incontinence. Denies any bowel complaints.    Nausea/Vomiting, if any: no  Pain issues, if any:  no  SAFETY ISSUES:  Prior radiation? no  Pacemaker/ICD? no  Possible current pregnancy? no, male patient  Is the patient on methotrexate? no  Current Complaints / other details:  69 year old male. Single. Retired. Former smoker. Hx. Of inguinal hernia repair and penile prosthesis. Grandfather (paternal) with hx of prostate cancer.

## 2020-08-26 ENCOUNTER — Other Ambulatory Visit: Payer: Self-pay

## 2020-08-26 ENCOUNTER — Ambulatory Visit
Admission: RE | Admit: 2020-08-26 | Discharge: 2020-08-26 | Disposition: A | Discharge status: Home / Self Care | Payer: 59 | Source: Ambulatory Visit | Attending: Radiation Oncology | Admitting: Radiation Oncology

## 2020-08-26 ENCOUNTER — Encounter: Payer: Self-pay | Admitting: Medical Oncology

## 2020-08-26 ENCOUNTER — Encounter: Payer: Self-pay | Admitting: Urology

## 2020-08-26 ENCOUNTER — Encounter: Payer: Self-pay | Admitting: Radiation Oncology

## 2020-08-26 VITALS — BP 132/88 | HR 83 | Temp 97.7°F | Resp 20 | Ht 67.0 in | Wt 169.8 lb

## 2020-08-26 DIAGNOSIS — E785 Hyperlipidemia, unspecified: Secondary | ICD-10-CM | POA: Insufficient documentation

## 2020-08-26 DIAGNOSIS — Z8042 Family history of malignant neoplasm of prostate: Secondary | ICD-10-CM | POA: Diagnosis not present

## 2020-08-26 DIAGNOSIS — Z7982 Long term (current) use of aspirin: Secondary | ICD-10-CM | POA: Insufficient documentation

## 2020-08-26 DIAGNOSIS — I10 Essential (primary) hypertension: Secondary | ICD-10-CM | POA: Insufficient documentation

## 2020-08-26 DIAGNOSIS — N529 Male erectile dysfunction, unspecified: Secondary | ICD-10-CM | POA: Diagnosis not present

## 2020-08-26 DIAGNOSIS — Z87891 Personal history of nicotine dependence: Secondary | ICD-10-CM | POA: Insufficient documentation

## 2020-08-26 DIAGNOSIS — Z79899 Other long term (current) drug therapy: Secondary | ICD-10-CM | POA: Diagnosis not present

## 2020-08-26 DIAGNOSIS — I2510 Atherosclerotic heart disease of native coronary artery without angina pectoris: Secondary | ICD-10-CM | POA: Insufficient documentation

## 2020-08-26 DIAGNOSIS — C61 Malignant neoplasm of prostate: Secondary | ICD-10-CM | POA: Diagnosis present

## 2020-08-26 DIAGNOSIS — R0602 Shortness of breath: Secondary | ICD-10-CM | POA: Insufficient documentation

## 2020-08-26 DIAGNOSIS — F418 Other specified anxiety disorders: Secondary | ICD-10-CM | POA: Insufficient documentation

## 2020-08-26 HISTORY — DX: Malignant neoplasm of prostate: C61

## 2020-08-26 NOTE — Progress Notes (Signed)
Radiation Oncology         [phone] ________________________________  Initial outpatient Consultation  Name: [name] Allen MRN: [id number]  Date: 08/26/2020  DOB: Jul 17, 1951  WU:JWJX, Hunt Oris, MD  Janith Lima, MD   REFERRING PHYSICIAN: Janith Lima, MD  DIAGNOSIS: 69 y.o. gentleman with Stage T1c adenocarcinoma of the prostate with Gleason score of 3+4, and PSA of 7.02.    ICD-10-CM   1. Malignant neoplasm of prostate ([place])  C61     HISTORY OF PRESENT ILLNESS: [name] Allen is a 69 y.o. male with a diagnosis of prostate cancer. He has been followed by [facility], originally with Dr. Risa Grill and later Dr. Gloriann Loan, for erectile dysfunction since 05/2015. He ultimately underwent penile prosthesis placement on 09/11/2017 with Dr. Gloriann Loan.  His PSA was initially noted to be elevated at 4.18 in 07/2017. At that time, he declined proceeding with prostate biopsy as recommended and instead opted for continued monitoring of the PSA. He was lost to follow up after his 09/2017 follow up visit until 08/2019, when he presented back to urology for further evaluation of acute urinary retention and UTI, following catheter placement in the ED. Following catheter removal, his urinary symptoms improved with tamsulosin.   His PSA was not checked again until follow up in 04/2020, at which time it had increased to 7.02.  The patient proceeded to transrectal ultrasound with 12 biopsies of the prostate on 07/01/2020 under the care of Dr. Claudia Desanctis.  DRE was within normal limits.  The prostate volume measured 57 cc.  Out of 12 core biopsies, 5 were positive, all on the left side.  The maximum Gleason score was 3+4, and this was seen in the left mid. Additionally, small foci (10% or less) of Gleason 3+3 was seen in the base lateral (with PNI), mid lateral (with PNI), base, and apex.  He met with Dr. Abner Greenspan on 07/27/2020 to discuss prostatectomy and at the conclusion of their consult visit, he was leaning towards  brachytherapy.  The patient reviewed the biopsy results with his urologist and he has kindly been referred today for discussion of potential radiation treatment options.   PREVIOUS RADIATION THERAPY: No  PAST MEDICAL HISTORY:  Past Medical History:  Diagnosis Date  . Anxiety   . Coronary artery disease 2003   status post CABG 2005  //     [place] 03/26/12: Ostial LAD 30-40%, mid LAD 20%, circumflex with minor irregularities less than 10%, mid RCA 99%, LIMA-LAD patent but with significant competitive flow from the native LAD, EF 55-65%.  PCI: Evolve 2 study stent to the mid RCA.  . Depression ~ 2005  . Dysrhythmia 2003   irregular  . Ejection fraction    EF 55-65%, catheterization, July, 2013  . HLD (hyperlipidemia)   . Hypertension   . Prostate cancer ([place])   . SOB (shortness of breath) on exertion    in the past/ had CABG      PAST SURGICAL HISTORY: Past Surgical History:  Procedure Laterality Date  . CARDIAC SURGERY  2013  . CORONARY ANGIOPLASTY WITH STENT PLACEMENT  03/26/12   "1"  . CORONARY ARTERY BYPASS GRAFT  2003   CABG X1  . INGUINAL HERNIA REPAIR  ~ 2008   right  . LEFT HEART CATHETERIZATION WITH CORONARY/GRAFT ANGIOGRAM  03/26/2012   Procedure: LEFT HEART CATHETERIZATION WITH Beatrix Fetters;  Surgeon: Peter M Martinique, MD;  Location: [facility] CATH LAB;  Service: Cardiovascular;;  . PENILE PROSTHESIS IMPLANT N/A  09/11/2017   Procedure: PENILE PROTHESIS INFLATABLE AMS;  Surgeon: Lucas Mallow, MD;  Location: [facility];  Service: Urology;  Laterality: N/A;  . PERCUTANEOUS CORONARY STENT INTERVENTION (PCI-S)  03/26/2012   Procedure: PERCUTANEOUS CORONARY STENT INTERVENTION (PCI-S);  Surgeon: Peter M Martinique, MD;  Location: [facility] CATH LAB;  Service: Cardiovascular;;    FAMILY HISTORY:  Family History  Problem Relation Age of Onset  . Heart disease Mother   . Depression Mother   . Diabetes Mother   . Early death Mother   . Hypertension Mother   .  Hyperlipidemia Mother   . Kidney disease Father   . Early death Father   . Cancer Father   . Prostate cancer Father   . Hypertension Sister   . Colon cancer Neg Hx   . Esophageal cancer Neg Hx   . Ovarian cancer Neg Hx   . Pancreatic cancer Neg Hx   . Rectal cancer Neg Hx   . Stomach cancer Neg Hx     SOCIAL HISTORY:  Social History   Socioeconomic History  . Marital status: Legally Separated    Spouse name: Not on file  . Number of children: Not on file  . Years of education: Not on file  . Highest education level: Not on file  Occupational History    Comment: retired  Tobacco Use  . Smoking status: Former Smoker    Packs/day: 0.30    Years: 10.00    Pack years: 3.00    Types: Cigarettes    Quit date: 06/30/2001    Years since quitting: 19.1  . Smokeless tobacco: Never Used  Vaping Use  . Vaping Use: Every day  Substance and Sexual Activity  . Alcohol use: Yes    Comment: "drink alcohol once/yr; motorcycle ralley"  . Drug use: Yes    Types: Marijuana    Comment: "tried marijuana one time in 1970; couldn't even talk"  . Sexual activity: Not Currently  Other Topics Concern  . Not on file  Social History Narrative  . Not on file   Social Determinants of Health   Financial Resource Strain:   . Difficulty of Paying Living Expenses: Not on file  Food Insecurity:   . Worried About Charity fundraiser in the Last Year: Not on file  . Ran Out of Food in the Last Year: Not on file  Transportation Needs:   . Lack of Transportation (Medical): Not on file  . Lack of Transportation (Non-Medical): Not on file  Physical Activity:   . Days of Exercise per Week: Not on file  . Minutes of Exercise per Session: Not on file  Stress:   . Feeling of Stress : Not on file  Social Connections:   . Frequency of Communication with Friends and Family: Not on file  . Frequency of Social Gatherings with Friends and Family: Not on file  . Attends Religious Services: Not on file  .  Active Member of Clubs or Organizations: Not on file  . Attends Archivist Meetings: Not on file  . Marital Status: Not on file  Intimate Partner Violence:   . Fear of Current or Ex-Partner: Not on file  . Emotionally Abused: Not on file  . Physically Abused: Not on file  . Sexually Abused: Not on file    ALLERGIES: Patient has no known allergies.  MEDICATIONS:  Current Outpatient Medications  Medication Sig Dispense Refill  . aspirin 81 MG tablet Take 81 mg  by mouth daily.    [name] atorvastatin (LIPITOR) 20 MG tablet TAKE 1 TABLET BY MOUTH EVERY DAY 90 tablet 2  . Multiple Vitamin (MULTIVITAMIN) tablet Take 1 tablet by mouth daily.     . tamsulosin (FLOMAX) 0.4 MG CAPS capsule Take 0.4 mg by mouth.    . terbinafine (LAMISIL) 250 MG tablet Take 1 tablet (250 mg total) by mouth daily. 30 tablet 0  . Vitamin D, Ergocalciferol, (DRISDOL) 1.25 MG (50000 UNIT) CAPS capsule Take 1 capsule (50,000 Units total) by mouth every 7 (seven) days. 12 capsule 0  . traZODone (DESYREL) 50 MG tablet TAKE 1 TABLET (50 MG TOTAL) AT BEDTIME BY MOUTH. (Patient not taking: Reported on 08/26/2020) 90 tablet 3   No current facility-administered medications for this encounter.    REVIEW OF SYSTEMS:  On review of systems, the patient reports that he is doing well overall. He denies any chest pain, shortness of breath, cough, fevers, chills, night sweats, unintended weight changes. He denies any bowel disturbances, and denies abdominal pain, nausea or vomiting. He denies any new musculoskeletal or joint aches or pains. His IPSS was 1, indicating minimal urinary symptoms. His SHIM was 15, indicating he has moderate erectile dysfunction. A complete review of systems is obtained and is otherwise negative.    PHYSICAL EXAM:  Wt Readings from Last 3 Encounters:  08/26/20 169 lb 12.8 oz (77 kg)  06/21/20 164 lb (74.4 kg)  05/17/20 164 lb 8 oz (74.6 kg)   Temp Readings from Last 3 Encounters:  08/26/20 97.7 F  (36.5 C)  06/21/20 98.6 F (37 C) (Oral)  05/17/20 98.3 F (36.8 C) (Oral)   BP Readings from Last 3 Encounters:  08/26/20 132/88  06/21/20 110/70  05/17/20 140/80   Pulse Readings from Last 3 Encounters:  08/26/20 83  06/21/20 61  05/17/20 71    /10  In general this is a well appearing African American male in no acute distress. He is alert and oriented x4 and appropriate throughout the examination. HEENT reveals that the patient is normocephalic, atraumatic. EOMs are intact. PERRLA. Skin is intact without any evidence of gross lesions. Cardiopulmonary assessment is negative for acute distress and he exhibits normal effort. The abdomen is soft, non tender, non distended. Lower extremities are negative for pretibial pitting edema, deep calf tenderness, cyanosis or clubbing.   KPS = 100  100 - Normal; no complaints; no evidence of disease. 90   - Able to carry on normal activity; minor signs or symptoms of disease. 80   - Normal activity with effort; some signs or symptoms of disease. 45   - Cares for self; unable to carry on normal activity or to do active work. 60   - Requires occasional assistance, but is able to care for most of his personal needs. 50   - Requires considerable assistance and frequent medical care. 60   - Disabled; requires special care and assistance. 103   - Severely disabled; hospital admission is indicated although death not imminent. 88   - Very sick; hospital admission necessary; active supportive treatment necessary. 10   - Moribund; fatal processes progressing rapidly. 0     - Dead  Karnofsky DA, Abelmann [place], Craver LS and Burchenal [facility] [phone]) The use of the nitrogen mustards in the palliative treatment of carcinoma: with particular reference to bronchogenic carcinoma Cancer 1 634-56  LABORATORY DATA:  Lab Results  Component Value Date   WBC 11.0 (H) 05/04/2020   HGB 14.4 05/04/2020  HCT 43.9 05/04/2020   MCV 90.0 05/04/2020   PLT 330 05/04/2020    Lab Results  Component Value Date   NA 136 09/01/2019   K 3.1 (L) 09/01/2019   CL 98 09/01/2019   CO2 28 09/01/2019   Lab Results  Component Value Date   ALT 24 05/04/2020   AST 23 05/04/2020   ALKPHOS 108 09/01/2019   BILITOT 0.6 05/04/2020     RADIOGRAPHY: No results found.    IMPRESSION/PLAN: 1. 69 y.o. gentleman with Stage T1c adenocarcinoma of the prostate with Gleason Score of 3+4, and PSA of 7.02. We discussed the patient's workup and outlined the nature of prostate cancer in this setting. The patient's T stage, Gleason's score, and PSA put him into the favorable intermediate risk group. Accordingly, he is eligible for a variety of potential treatment options including brachytherapy, 5.5 weeks of external radiation, or prostatectomy. We discussed the available radiation techniques, and focused on the details and logistics of delivery. We discussed and outlined the risks, benefits, short and long-term effects associated with radiotherapy and compared and contrasted these with prostatectomy. We discussed the role of SpaceOAR gel in reducing the rectal toxicity associated with radiotherapy. He appears to have a good understanding of his disease and our treatment recommendations which are of curative intent.  He was encouraged to ask questions that were answered to his stated satisfaction.  At the conclusion of our conversation, the patient is interested in moving forward with brachytherapy and use of SpaceOAR gel to reduce rectal toxicity from radiotherapy.  We will share our discussion with Dr. Claudia Desanctis and move forward with scheduling his [facility] planning appointment in the near future.  The patient met briefly with Romie Jumper in our office who will be working closely with him to coordinate OR scheduling and pre and post procedure appointments.  We will contact the pharmaceutical rep to ensure that [facility] is available at the time of procedure.  We enjoyed meeting him today and look  forward to continuing to participate in his care.    Nicholos Johns, PA-C    Tyler Pita, MD  [facility] Direct Dial: [phone]  Fax: [phone] [email or web address]  Skype  LinkedIn   This document serves as a record of services personally performed by Tyler Pita, MD and Freeman Caldron, PA-C. It was created on their behalf by Wilburn Mylar, a trained medical scribe. The creation of this record is based on the scribe's personal observations and the provider's statements to them. This document has been checked and approved by the attending provider.

## 2020-08-26 NOTE — Progress Notes (Signed)
Introduced myself to patient as the prostate nurse navigator and discussed my role. He was interested in prostatectomy but now interested in brachytherapy. He is here to discuss his radiation options in detail before making a decision. Currently, no barriers to care identified. I gave him my business card and asked him to call me with questions or concerns.

## 2020-08-30 ENCOUNTER — Telehealth: Payer: Self-pay | Admitting: *Deleted

## 2020-08-30 NOTE — Telephone Encounter (Signed)
Called patient to update, spoke with patient. 

## 2020-09-13 ENCOUNTER — Ambulatory Visit: Payer: 59 | Admitting: Podiatry

## 2020-09-15 ENCOUNTER — Ambulatory Visit: Payer: 59 | Admitting: Podiatry

## 2020-09-21 ENCOUNTER — Other Ambulatory Visit: Payer: Self-pay | Admitting: Urology

## 2020-09-21 DIAGNOSIS — C61 Malignant neoplasm of prostate: Secondary | ICD-10-CM

## 2020-10-05 ENCOUNTER — Telehealth: Payer: Self-pay | Admitting: *Deleted

## 2020-10-05 NOTE — Telephone Encounter (Signed)
CALLED PATIENT TO REMIND OF PRE-SEED APPTS. FOR 07-27-21, SPOKE WITH PATIENT AND HE IS AWARE OF THESE APPTS. 

## 2020-10-06 ENCOUNTER — Ambulatory Visit
Admission: RE | Admit: 2020-10-06 | Discharge: 2020-10-06 | Disposition: A | Discharge status: Home / Self Care | Payer: 59 | Source: Ambulatory Visit | Attending: Radiation Oncology | Admitting: Radiation Oncology

## 2020-10-06 ENCOUNTER — Other Ambulatory Visit: Payer: Self-pay

## 2020-10-06 ENCOUNTER — Ambulatory Visit
Admission: RE | Admit: 2020-10-06 | Discharge: 2020-10-06 | Disposition: A | Discharge status: Home / Self Care | Payer: 59 | Source: Ambulatory Visit | Attending: Urology | Admitting: Urology

## 2020-10-06 ENCOUNTER — Encounter (HOSPITAL_COMMUNITY)
Admission: RE | Admit: 2020-10-06 | Discharge: 2020-10-06 | Disposition: A | Discharge status: Home / Self Care | Payer: 59 | Source: Ambulatory Visit | Attending: Urology | Admitting: Urology

## 2020-10-06 ENCOUNTER — Ambulatory Visit (HOSPITAL_COMMUNITY)
Admission: RE | Admit: 2020-10-06 | Discharge: 2020-10-06 | Disposition: A | Discharge status: Home / Self Care | Payer: 59 | Source: Ambulatory Visit | Attending: Urology | Admitting: Urology

## 2020-10-06 DIAGNOSIS — C61 Malignant neoplasm of prostate: Secondary | ICD-10-CM

## 2020-10-06 DIAGNOSIS — Z510 Encounter for antineoplastic radiation therapy: Secondary | ICD-10-CM | POA: Diagnosis not present

## 2020-10-06 NOTE — Progress Notes (Signed)
  Radiation Oncology         [phone] ________________________________  Name: [name] MRN: [id number]  Date: 10/06/2020  DOB: [birth year]  SIMULATION AND TREATMENT PLANNING NOTE PUBIC ARCH STUDY  HQ:IONG, Len Blalock, MD  Corwin Levins, MD  DIAGNOSIS:  70 y.o. gentleman with Stage T1c adenocarcinoma of the prostate with Gleason score of 3+4, and PSA of 7.02.  Oncology History  Malignant neoplasm of prostate ([facility])  07/01/2020 Cancer Staging   Staging form: Prostate, AJCC 8th Edition - Clinical stage from 07/01/2020: Stage IIB (cT1c, cN0, cM0, PSA: 7, Grade Group: 2) - Signed by Marcello Fennel, PA-C on 08/26/2020   08/26/2020 Initial Diagnosis   Malignant neoplasm of prostate (HCC)       ICD-10-CM   1. Malignant neoplasm of prostate ([facility])  C61     COMPLEX SIMULATION:  The patient presented today for evaluation for possible prostate seed implant. He was brought to the radiation planning suite and placed supine on the CT couch. A 3-dimensional image study set was obtained in upload to the planning computer. There, on each axial slice, I contoured the prostate gland. Then, using three-dimensional radiation planning tools I reconstructed the prostate in view of the structures from the transperineal needle pathway to assess for possible pubic arch interference. In doing so, I did not appreciate any pubic arch interference.  He also has penile prostheses, which should not block the path to the prostate.  Also, the patient's prostate volume was estimated based on the drawn structure. The volume was 57 cc.  Given the pubic arch appearance and prostate volume, patient remains a good candidate to proceed with prostate seed implant. Today, he freely provided informed written consent to proceed.    PLAN: The patient will undergo prostate seed implant.   ________________________________  Artist Pais. Kathrynn Running, M.D.

## 2020-11-07 ENCOUNTER — Other Ambulatory Visit: Payer: Self-pay

## 2020-11-08 ENCOUNTER — Encounter (HOSPITAL_BASED_OUTPATIENT_CLINIC_OR_DEPARTMENT_OTHER): Payer: Self-pay | Admitting: Urology

## 2020-11-08 ENCOUNTER — Telehealth: Payer: Self-pay | Admitting: *Deleted

## 2020-11-08 ENCOUNTER — Ambulatory Visit (INDEPENDENT_AMBULATORY_CARE_PROVIDER_SITE_OTHER): Payer: 59 | Admitting: Internal Medicine

## 2020-11-08 ENCOUNTER — Other Ambulatory Visit: Payer: Self-pay

## 2020-11-08 ENCOUNTER — Encounter: Payer: Self-pay | Admitting: Internal Medicine

## 2020-11-08 VITALS — BP 132/84 | HR 67 | Temp 97.8°F | Ht 67.0 in | Wt 171.0 lb

## 2020-11-08 DIAGNOSIS — C61 Malignant neoplasm of prostate: Secondary | ICD-10-CM | POA: Diagnosis not present

## 2020-11-08 DIAGNOSIS — E7800 Pure hypercholesterolemia, unspecified: Secondary | ICD-10-CM

## 2020-11-08 DIAGNOSIS — I10 Essential (primary) hypertension: Secondary | ICD-10-CM

## 2020-11-08 DIAGNOSIS — Z0000 Encounter for general adult medical examination without abnormal findings: Secondary | ICD-10-CM | POA: Diagnosis not present

## 2020-11-08 DIAGNOSIS — E559 Vitamin D deficiency, unspecified: Secondary | ICD-10-CM

## 2020-11-08 DIAGNOSIS — Z0001 Encounter for general adult medical examination with abnormal findings: Secondary | ICD-10-CM

## 2020-11-08 MED ORDER — ATORVASTATIN CALCIUM 40 MG PO TABS: 40.0000 mg | ORAL_TABLET | Freq: Every day | ORAL | 3 refills | Status: DC

## 2020-11-08 NOTE — Telephone Encounter (Signed)
Called patient to remind of lab and Covid testing for 11-10-20, spoke with patient and he is aware of these appts.

## 2020-11-08 NOTE — Progress Notes (Signed)
Spoke w/ via phone for pre-op interview--- Pt Lab needs dos---- no              Lab results------ pt getting CBC, CMP, PT/PTT done 11-10-2020 @ 0830;  Current ekg/ cxr in epic/ chart COVID test ------ 11-10-2020 @ 1000 Arrive at ------- 1000 on 11-14-2020 NPO after MN NO Solid Food.  Clear liquids from MN until--- 0900 Medications to take morning of surgery ----- NONE Diabetic medication ----- n/a Patient Special Instructions ----- will do one fleet enema morning of surgery Pre-Op special Istructions ----- n/a Patient verbalized understanding of instructions that were given at this phone interview. Patient denies shortness of breath, chest pain, fever, cough at this phone interview.   Anesthesia :   HTN, no meds;  CAD s/p CABG x1 08/ 2005 and 07/ 2013 post PCI/ DES to [facility]; pt denies any cardiac s&s, sob, and no peripheral swelling.   PCP:  Dr Marshall Cork (lov 11-08-2020 epic) Cardiologist :  Dr Marlou Porch ([place] 11-08-2016 epic, pt was released as needed, to f/u with pcp Chest x-ray :  10-06-2020 epic EKG :  10-06-2020 epic Echo :  no Stress test:  11-12-2012 epic (normal , nuclear ef 58%)  Cardiac Cath :  03-26-2012 epic Activity level: denies sob w/ any activity Sleep Study/ CPAP : NO Fasting Blood Sugar :      / Checks Blood Sugar -- times a day:  NO Blood Thinner/ Instructions /Last Dose: NO ASA / Instructions/ Last Dose : ASA 81mg /  Pt stated was told by dr gay office to stop week prior to surgery

## 2020-11-08 NOTE — Patient Instructions (Addendum)
You will be contacted regarding the referral for: cologuard  Please take OTC Vitamin D3 at 2000 units per day, indefinitely.  Ok to increase the lipitor to 40 mg per day  Please continue all other medications as before, and refills have been done if requested.  Please have the pharmacy call with any other refills you may need.  Please continue your efforts at being more active, low cholesterol diet, and weight control.  You are otherwise up to date with prevention measures today.  Please keep your appointments with your specialists as you may have planned - seed placement on Monday  Please make an Appointment to return in 6 months, or sooner if needed

## 2020-11-08 NOTE — Progress Notes (Signed)
Patient ID: [name] Allen, male   DOB: [birth year], 70 y.o.   MRN: [id number]         Chief Complaint:: wellness exam and Follow-up  low vit d, prostate ca, and hld       HPI:  [name] Allen is a 71 y.o. male here for wellness exam; due for cologuard, delcines labs today, ow up to date with preventive referrals and immunizations   Wt Readings from Last 3 Encounters:  11/10/20 177 lb (80.3 kg)  11/08/20 171 lb (77.6 kg)  08/26/20 169 lb 12.8 oz (77 kg)   BP Readings from Last 3 Encounters:  11/10/20 127/78  11/08/20 132/84  08/26/20 132/88   Immunization History  Administered Date(s) Administered   Fluad Quad(high Dose 65+) 06/21/2020   Influenza, High Dose Seasonal PF 09/07/2015   Influenza-Unspecified 07/31/2017, 07/08/2018, 06/17/2019   PFIZER(Purple Top)SARS-COV-2 Vaccination 10/26/2019, 11/16/2019, 06/27/2020   Pneumococcal Conjugate-13 08/07/2017   Pneumococcal Polysaccharide-23 11/19/2014   Tdap 02/29/2012   Health Maintenance Due  Topic Date Due   Fecal DNA (Cologuard)  09/11/2020         Also not taking vit d, trying to follow a lower chol diet, and has prostate ca due for seed placement on Monday next.  Pt denies chest pain, increased sob or doe, wheezing, orthopnea, PND, increased LE swelling, palpitations, dizziness or syncope.   Pt denies polydipsia, polyuria, Denies new focal neuro s/s.   Pt denies fever, wt loss, night sweats, loss of appetite, or other constitutional symptoms  Past Medical History:  Diagnosis Date   Anxiety    Coronary artery disease previouly seen by cardiologist--- dr Marlou Porch, [place] [facility] in epic 11-08-2016 was released prn and follow up with pcp   cardiac cath 05-18-2004 severe 1V CAD of LAD & nonobstructive cad RCA/ Cfx;  05-19-2004 s/p CABG x1 ,  LIMA to distalLAD //    recurrent chest pain  LHC 03/26/12-- nonobOstial LAD,patent graft, minor irregularities Cfx, PCI/ DES x1 to midRCA 99% EF 55-65%//  nuclear study 11-12-2012  normal w/ no ischemia  and normal LVF and wall motion, nuclear ef 58%   Depression    GERD (gastroesophageal reflux disease)    HLD (hyperlipidemia)    Hypertension    followed by pcp---  currently treated w/ diet, no medication since 2020   Prostate cancer [facility]) urologist--- dr gay/  oncologist--- dr Tammi Klippel   dx 10/ 2021,  Stage T1c, Gleason 3+4, PSA 7.02   Vitamin D deficiency    Past Surgical History:  Procedure Laterality Date   CARDIAC CATHETERIZATION  05-18-2004  @[facility]    1V CAD involving distal LAD ,  nonobstructive cad involving RCA/ CFx,  ef 45-50%,  anterior and inferior apical hypokinesis   COLONOSCOPY WITH PROPOFOL  last one 11-22-2017   CORONARY ARTERY BYPASS GRAFT  05-19-2004  dr Roxy Manns @[facility]    x1  LIMA to distal LAD   INGUINAL HERNIA REPAIR Right 03-29-2008  @[facility]    LEFT HEART CATHETERIZATION WITH CORONARY/GRAFT ANGIOGRAM  03/26/2012   Procedure: LEFT HEART CATHETERIZATION WITH Beatrix Fetters;  Surgeon: Peter M Martinique, MD;  Location: [facility] CATH LAB;  Service: Cardiovascular;;   PENILE PROSTHESIS IMPLANT N/A 09/11/2017   Procedure: PENILE PROTHESIS INFLATABLE AMS;  Surgeon: Lucas Mallow, MD;  Location: [facility];  Service: Urology;  Laterality: N/A;   PERCUTANEOUS CORONARY STENT INTERVENTION (PCI-S)  03/26/2012   Procedure: PERCUTANEOUS CORONARY STENT INTERVENTION (PCI-S);  Surgeon: Peter M Martinique, MD;  Location: [facility] CATH LAB;  Service: Cardiovascular;;    reports that he quit smoking about 19 years ago. His smoking use included cigarettes. He has a 3.00 pack-year smoking history. He has never used smokeless tobacco. He reports previous alcohol use. He reports previous drug use. Drug: Marijuana. family history includes Cancer in his father; Depression in his mother; Diabetes in his mother; Early death in his father and mother; Heart disease in his mother; Hyperlipidemia in his mother; Hypertension in his mother and sister; Kidney disease in his father; Prostate cancer  in his father. No Known Allergies Current Outpatient Medications on File Prior to Visit  Medication Sig Dispense Refill   aspirin 81 MG tablet Take 81 mg by mouth daily.     Multiple Vitamin (MULTIVITAMIN) tablet Take 1 tablet by mouth daily.      tamsulosin (FLOMAX) 0.4 MG CAPS capsule Take 0.4 mg by mouth daily after supper.     traZODone (DESYREL) 50 MG tablet TAKE 1 TABLET (50 MG TOTAL) AT BEDTIME BY MOUTH. 90 tablet 3   [DISCONTINUED] pantoprazole (PROTONIX) 40 MG tablet TAKE 1 TABLET BY MOUTH EVERY DAY (Patient not taking: Reported on 08/29/2019) 90 tablet 1   No current facility-administered medications on file prior to visit.        ROS:  All others reviewed and negative.  Objective        PE:  BP 132/84    Pulse 67    Temp 97.8 F (36.6 C) (Oral)    Ht 5\' 7"  (1.702 m)    Wt 171 lb (77.6 kg)    SpO2 98%    BMI 26.78 kg/m                 Constitutional: Pt appears in NAD               HENT: Head: NCAT.                Right Ear: External ear normal.                 Left Ear: External ear normal.                Eyes: . Pupils are equal, round, and reactive to light. Conjunctivae and EOM are normal               Nose: without d/c or deformity               Neck: Neck supple. Gross normal ROM               Cardiovascular: Normal rate and regular rhythm.                 Pulmonary/Chest: Effort normal and breath sounds without rales or wheezing.                Abd:  Soft, NT, ND, + BS, no organomegaly               Neurological: Pt is alert. At baseline orientation, motor grossly intact               Skin: Skin is warm. No rashes, no other new lesions, LE edema - none               Psychiatric: Pt behavior is normal without agitation   Micro: none  Cardiac tracings I have personally interpreted today:  none  Pertinent Radiological findings (summarize): none   Lab Results  Component Value Date   WBC  9.1 11/10/2020   HGB 13.6 11/10/2020   HCT 42.1 11/10/2020   PLT 355  11/10/2020   GLUCOSE 94 11/10/2020   CHOL 173 05/04/2020   TRIG 71 05/04/2020   HDL 54 05/04/2020   LDLCALC 103 (H) 05/04/2020   ALT 17 11/10/2020   AST 21 11/10/2020   NA 137 11/10/2020   K 3.7 11/10/2020   CL 107 11/10/2020   CREATININE 0.88 11/10/2020   BUN 10 11/10/2020   CO2 23 11/10/2020   TSH 1.08 05/04/2020   PSA 12.2 (H) 05/04/2020   INR 1.0 11/10/2020   HGBA1C 5.9 (H) 05/04/2020   Assessment/Plan:  [name] Allen is a 70 y.o. Black or African American [2] male with  has a past medical history of Anxiety, Coronary artery disease (previouly seen by cardiologist--- dr Marlou Porch, lov in epic 11-08-2016 was released prn and follow up with pcp), Depression, GERD (gastroesophageal reflux disease), HLD (hyperlipidemia), Hypertension, Prostate cancer [facility]) (urologist--- dr gay/  oncologist--- dr Tammi Klippel), and Vitamin D deficiency.  Encounter for well adult exam with abnormal findings Age and sex appropriate education and counseling updated with regular exercise and diet Referrals for preventative services - for cologuard Immunizations addressed - none needed Smoking counseling  - none needed Evidence for depression or other mood disorder - none significant Most recent labs reviewed. I have personally reviewed and have noted: 1) the patient's medical and social history 2) The patient's current medications and supplements 3) The patient's height, weight, and BMI have been recorded in the chart   Malignant neoplasm of prostate ([place]) For f/u seed placement next Monday,  to f/u any worsening symptoms or concerns  HYPERCHOLESTEROLEMIA Uncontrolled, for increased lipitor 40 mg Lab Results  Component Value Date   CHOL 173 05/04/2020   HDL 54 05/04/2020   LDLCALC 103 (H) 05/04/2020   TRIG 71 05/04/2020   CHOLHDL 3.2 05/04/2020    Hypertension BP Readings from Last 3 Encounters:  11/10/20 127/78  11/08/20 132/84  08/26/20 132/88   Stable, pt to continue medical treatment  -  diet and wt control   Vitamin D deficiency Last vitamin D Lab Results  Component Value Date   VD25OH 19 (L) 05/04/2020   Low to start oral replacement   Followup: Return in about 6 months (around 05/08/2021).  Cathlean Cower, MD 11/13/2020 10:21 PM [facility]

## 2020-11-10 ENCOUNTER — Other Ambulatory Visit: Payer: Self-pay

## 2020-11-10 ENCOUNTER — Encounter (HOSPITAL_COMMUNITY)
Admission: RE | Admit: 2020-11-10 | Discharge: 2020-11-10 | Disposition: A | Discharge status: Home / Self Care | Payer: 59 | Source: Ambulatory Visit | Attending: Urology | Admitting: Urology

## 2020-11-10 ENCOUNTER — Other Ambulatory Visit (HOSPITAL_COMMUNITY)
Admission: RE | Admit: 2020-11-10 | Discharge: 2020-11-10 | Disposition: A | Discharge status: Home / Self Care | Payer: 59 | Source: Ambulatory Visit | Attending: Urology | Admitting: Urology

## 2020-11-10 DIAGNOSIS — Z8616 Personal history of COVID-19: Secondary | ICD-10-CM

## 2020-11-10 DIAGNOSIS — U071 COVID-19: Secondary | ICD-10-CM | POA: Diagnosis not present

## 2020-11-10 DIAGNOSIS — Z01812 Encounter for preprocedural laboratory examination: Secondary | ICD-10-CM | POA: Diagnosis present

## 2020-11-10 HISTORY — DX: Personal history of COVID-19: Z86.16

## 2020-11-10 LAB — CBC
HCT: 42.1 % (ref 39.0–52.0)
Hemoglobin: 13.6 g/dL (ref 13.0–17.0)
MCH: 28.8 pg (ref 26.0–34.0)
MCHC: 32.3 g/dL (ref 30.0–36.0)
MCV: 89.2 fL (ref 80.0–100.0)
Platelets: 355 10*3/uL (ref 150–400)
RBC: 4.72 MIL/uL (ref 4.22–5.81)
RDW: 15.2 % (ref 11.5–15.5)
WBC: 9.1 10*3/uL (ref 4.0–10.5)
nRBC: 0 % (ref 0.0–0.2)

## 2020-11-10 LAB — COMPREHENSIVE METABOLIC PANEL
ALT: 17 U/L (ref 0–44)
AST: 21 U/L (ref 15–41)
Albumin: 3.6 g/dL (ref 3.5–5.0)
Alkaline Phosphatase: 90 U/L (ref 38–126)
Anion gap: 7 (ref 5–15)
BUN: 10 mg/dL (ref 8–23)
CO2: 23 mmol/L (ref 22–32)
Calcium: 9 mg/dL (ref 8.9–10.3)
Chloride: 107 mmol/L (ref 98–111)
Creatinine, Ser: 0.88 mg/dL (ref 0.61–1.24)
GFR, Estimated: >60 mL/min (ref >60)
Glucose, Bld: 94 mg/dL (ref 70–99)
Potassium: 3.7 mmol/L (ref 3.5–5.1)
Sodium: 137 mmol/L (ref 135–145)
Total Bilirubin: 0.6 mg/dL (ref 0.3–1.2)
Total Protein: 7.1 g/dL (ref 6.5–8.1)

## 2020-11-10 LAB — APTT: aPTT: 38 seconds — ABNORMAL HIGH (ref 24–36)

## 2020-11-10 LAB — PROTIME-INR
INR: 1 (ref 0.8–1.2)
Prothrombin Time: 13.1 seconds (ref 11.4–15.2)

## 2020-11-10 LAB — SARS CORONAVIRUS 2 (TAT 6-24 HRS): SARS Coronavirus 2: POSITIVE — AB

## 2020-11-11 ENCOUNTER — Telehealth: Payer: Self-pay

## 2020-11-11 NOTE — Progress Notes (Signed)
Patient tested positive for Covid. Left VM for surgery scheduler.

## 2020-11-11 NOTE — Telephone Encounter (Signed)
Called to discuss with patient about COVID-19 symptoms and the use of one of the available treatments for those with mild to moderate Covid symptoms and at a high risk of hospitalization.  Pt appears to qualify for outpatient treatment due to co-morbid conditions and/or a member of an at-risk group in accordance with the FDA Emergency Use Authorization.    Symptom onset: None Vaccinated: Yes Booster? Yes Immunocompromised? No Qualifiers: CAD,HTN  Pt. Reports no symptoms.   [name] Allen

## 2020-11-13 ENCOUNTER — Encounter: Payer: Self-pay | Admitting: Internal Medicine

## 2020-11-13 DIAGNOSIS — E559 Vitamin D deficiency, unspecified: Secondary | ICD-10-CM | POA: Insufficient documentation

## 2020-11-13 NOTE — Assessment & Plan Note (Signed)
BP Readings from Last 3 Encounters:  11/10/20 127/78  11/08/20 132/84  08/26/20 132/88   Stable, pt to continue medical treatment  - diet and wt control

## 2020-11-13 NOTE — Assessment & Plan Note (Signed)
Last vitamin D Lab Results  Component Value Date   VD25OH 19 (L) 05/04/2020   Low to start oral replacement

## 2020-11-13 NOTE — Assessment & Plan Note (Signed)
For f/u seed placement next Monday,  to f/u any worsening symptoms or concerns

## 2020-11-13 NOTE — Assessment & Plan Note (Signed)
Uncontrolled, for increased lipitor 40 mg Lab Results  Component Value Date   CHOL 173 05/04/2020   HDL 54 05/04/2020   LDLCALC 103 (H) 05/04/2020   TRIG 71 05/04/2020   CHOLHDL 3.2 05/04/2020

## 2020-11-13 NOTE — Assessment & Plan Note (Signed)
Age and sex appropriate education and counseling updated with regular exercise and diet Referrals for preventative services - for cologuard Immunizations addressed - none needed Smoking counseling  - none needed Evidence for depression or other mood disorder - none significant Most recent labs reviewed. I have personally reviewed and have noted: 1) the patient's medical and social history 2) The patient's current medications and supplements 3) The patient's height, weight, and BMI have been recorded in the chart  

## 2020-11-15 MED ORDER — OXYCODONE HCL 5 MG PO TABS
ORAL_TABLET | ORAL | Status: AC
  Filled 2020-11-15: qty 1

## 2020-11-23 ENCOUNTER — Other Ambulatory Visit: Payer: Self-pay

## 2020-11-23 ENCOUNTER — Encounter (HOSPITAL_COMMUNITY)
Admission: RE | Admit: 2020-11-23 | Discharge: 2020-11-23 | Disposition: A | Discharge status: Home / Self Care | Payer: 59 | Source: Ambulatory Visit | Attending: Urology | Admitting: Urology

## 2020-11-23 DIAGNOSIS — Z01812 Encounter for preprocedural laboratory examination: Secondary | ICD-10-CM | POA: Insufficient documentation

## 2020-11-23 LAB — COMPREHENSIVE METABOLIC PANEL
ALT: 22 U/L (ref 0–44)
AST: 21 U/L (ref 15–41)
Albumin: 3.6 g/dL (ref 3.5–5.0)
Alkaline Phosphatase: 94 U/L (ref 38–126)
Anion gap: 6 (ref 5–15)
BUN: 8 mg/dL (ref 8–23)
CO2: 24 mmol/L (ref 22–32)
Calcium: 9.1 mg/dL (ref 8.9–10.3)
Chloride: 106 mmol/L (ref 98–111)
Creatinine, Ser: 1.06 mg/dL (ref 0.61–1.24)
GFR, Estimated: >60 mL/min (ref >60)
Glucose, Bld: 105 mg/dL — ABNORMAL HIGH (ref 70–99)
Potassium: 4.3 mmol/L (ref 3.5–5.1)
Sodium: 136 mmol/L (ref 135–145)
Total Bilirubin: 0.5 mg/dL (ref 0.3–1.2)
Total Protein: 7.2 g/dL (ref 6.5–8.1)

## 2020-11-23 LAB — CBC
HCT: 43.5 % (ref 39.0–52.0)
Hemoglobin: 14.1 g/dL (ref 13.0–17.0)
MCH: 28.8 pg (ref 26.0–34.0)
MCHC: 32.4 g/dL (ref 30.0–36.0)
MCV: 89 fL (ref 80.0–100.0)
Platelets: 347 10*3/uL (ref 150–400)
RBC: 4.89 MIL/uL (ref 4.22–5.81)
RDW: 15.1 % (ref 11.5–15.5)
WBC: 10.4 10*3/uL (ref 4.0–10.5)
nRBC: 0 % (ref 0.0–0.2)

## 2020-11-23 LAB — PROTIME-INR
INR: 1 (ref 0.8–1.2)
Prothrombin Time: 12.3 seconds (ref 11.4–15.2)

## 2020-11-23 LAB — APTT: aPTT: 36 seconds (ref 24–36)

## 2020-11-25 ENCOUNTER — Encounter (HOSPITAL_BASED_OUTPATIENT_CLINIC_OR_DEPARTMENT_OTHER): Payer: Self-pay | Admitting: Urology

## 2020-11-25 ENCOUNTER — Other Ambulatory Visit: Payer: Self-pay

## 2020-11-25 NOTE — Progress Notes (Signed)
Spoke w/ via phone for pre-op interview--- Pt Lab needs dos---- no              Lab results------ pt had  CBC, CMP, PT/PTT done 03-022022 results in epic;  Current ekg/ cxr in epic/ chart COVID test ------ pt positive covid 11-10-2020 result in epic, done for surgery.  Pt stated asymptomatic Arrive at ------- 1000 on 12-01-2020 NPO after MN NO Solid Food.  Clear liquids from MN until--- 0900 Medications to take morning of surgery ----- NONE Diabetic medication ----- n/a Patient Special Instructions ----- will do one fleet enema morning of surgery Pre-Op special Istructions ----- n/a Patient verbalized understanding of instructions that were given at this phone interview. Patient denies shortness of breath, chest pain, fever, cough at this phone interview.   Anesthesia :   HTN, no meds;  CAD s/p CABG x1 08/ 2005 and 07/ 2013 post PCI/ DES to [facility]; pt denies any cardiac s&s, sob, and no peripheral swelling.   PCP:  Dr Marshall Cork (lov 11-08-2020 epic) Cardiologist :  Dr Marlou Porch (lov 11-08-2016 epic, pt was released as needed, to f/u with pcp Chest x-ray :  10-06-2020 epic EKG :  10-06-2020 epic Echo :  no Stress test:  11-12-2012 epic (normal , nuclear ef 58%)  Cardiac Cath :  03-26-2012 epic Activity level: denies sob w/ any activity Sleep Study/ CPAP : NO Fasting Blood Sugar :      / Checks Blood Sugar -- times a day:  NO Blood Thinner/ Instructions /Last Dose: NO ASA / Instructions/ Last Dose : ASA 81mg /  Pt stated was told by dr gay office to stop week prior to surgery, last dose 11-23-2020

## 2020-11-30 ENCOUNTER — Telehealth: Payer: Self-pay | Admitting: *Deleted

## 2020-11-30 NOTE — Telephone Encounter (Signed)
CALLED PATIENT TO REMIND OF PROCEDURE FOR 12-01-20, SPOKE WITH PATIENT AND HE IS AWARE OF THIS PROCEDURE.

## 2020-12-01 ENCOUNTER — Ambulatory Visit (HOSPITAL_BASED_OUTPATIENT_CLINIC_OR_DEPARTMENT_OTHER)
Admission: RE | Admit: 2020-12-01 | Discharge: 2020-12-01 | Disposition: A | Discharge status: Home / Self Care | Payer: 59 | Source: Ambulatory Visit | Attending: Urology | Admitting: Urology

## 2020-12-01 ENCOUNTER — Ambulatory Visit (HOSPITAL_BASED_OUTPATIENT_CLINIC_OR_DEPARTMENT_OTHER): Payer: 59 | Admitting: Anesthesiology

## 2020-12-01 ENCOUNTER — Ambulatory Visit (HOSPITAL_COMMUNITY): Payer: 59

## 2020-12-01 ENCOUNTER — Encounter (HOSPITAL_BASED_OUTPATIENT_CLINIC_OR_DEPARTMENT_OTHER)
Admission: RE | Disposition: A | Discharge status: Home / Self Care | Payer: Self-pay | Source: Ambulatory Visit | Attending: Urology

## 2020-12-01 ENCOUNTER — Encounter (HOSPITAL_BASED_OUTPATIENT_CLINIC_OR_DEPARTMENT_OTHER): Payer: Self-pay | Admitting: Urology

## 2020-12-01 ENCOUNTER — Other Ambulatory Visit: Payer: Self-pay

## 2020-12-01 DIAGNOSIS — Z951 Presence of aortocoronary bypass graft: Secondary | ICD-10-CM | POA: Insufficient documentation

## 2020-12-01 DIAGNOSIS — C61 Malignant neoplasm of prostate: Secondary | ICD-10-CM | POA: Diagnosis present

## 2020-12-01 DIAGNOSIS — Z833 Family history of diabetes mellitus: Secondary | ICD-10-CM | POA: Insufficient documentation

## 2020-12-01 DIAGNOSIS — E785 Hyperlipidemia, unspecified: Secondary | ICD-10-CM | POA: Insufficient documentation

## 2020-12-01 DIAGNOSIS — I10 Essential (primary) hypertension: Secondary | ICD-10-CM | POA: Insufficient documentation

## 2020-12-01 DIAGNOSIS — R351 Nocturia: Secondary | ICD-10-CM | POA: Diagnosis not present

## 2020-12-01 DIAGNOSIS — I2510 Atherosclerotic heart disease of native coronary artery without angina pectoris: Secondary | ICD-10-CM | POA: Diagnosis not present

## 2020-12-01 DIAGNOSIS — Z87891 Personal history of nicotine dependence: Secondary | ICD-10-CM | POA: Insufficient documentation

## 2020-12-01 DIAGNOSIS — Z8249 Family history of ischemic heart disease and other diseases of the circulatory system: Secondary | ICD-10-CM | POA: Insufficient documentation

## 2020-12-01 DIAGNOSIS — Z955 Presence of coronary angioplasty implant and graft: Secondary | ICD-10-CM | POA: Insufficient documentation

## 2020-12-01 DIAGNOSIS — Z809 Family history of malignant neoplasm, unspecified: Secondary | ICD-10-CM | POA: Insufficient documentation

## 2020-12-01 DIAGNOSIS — Z8616 Personal history of COVID-19: Secondary | ICD-10-CM | POA: Insufficient documentation

## 2020-12-01 DIAGNOSIS — Z8719 Personal history of other diseases of the digestive system: Secondary | ICD-10-CM | POA: Diagnosis not present

## 2020-12-01 DIAGNOSIS — N401 Enlarged prostate with lower urinary tract symptoms: Secondary | ICD-10-CM | POA: Diagnosis not present

## 2020-12-01 DIAGNOSIS — N5201 Erectile dysfunction due to arterial insufficiency: Secondary | ICD-10-CM | POA: Diagnosis not present

## 2020-12-01 HISTORY — DX: Gastro-esophageal reflux disease without esophagitis: K21.9

## 2020-12-01 HISTORY — PX: SPACE OAR INSTILLATION: SHX6769

## 2020-12-01 HISTORY — DX: Vitamin D deficiency, unspecified: E55.9

## 2020-12-01 HISTORY — PX: RADIOACTIVE SEED IMPLANT: SHX5150

## 2020-12-01 SURGERY — INSERTION, RADIATION SOURCE, PROSTATE
Anesthesia: General | Site: Rectum

## 2020-12-01 MED ORDER — MIDAZOLAM HCL 2 MG/2ML IJ SOLN
INTRAMUSCULAR | Status: DC | PRN
  Administered 2020-12-01: 2 mg via INTRAVENOUS

## 2020-12-01 MED ORDER — FLEET ENEMA 7-19 GM/118ML RE ENEM: 1.0000 | ENEMA | Freq: Once | RECTAL | Status: DC

## 2020-12-01 MED ORDER — AMISULPRIDE (ANTIEMETIC) 5 MG/2ML IV SOLN: 10.0000 mg | Freq: Once | INTRAVENOUS | Status: DC | PRN

## 2020-12-01 MED ORDER — PHENYLEPHRINE 40 MCG/ML (10ML) SYRINGE FOR IV PUSH (FOR BLOOD PRESSURE SUPPORT)
PREFILLED_SYRINGE | INTRAVENOUS | Status: AC
  Filled 2020-12-01: qty 10

## 2020-12-01 MED ORDER — PROPOFOL 10 MG/ML IV BOLUS
INTRAVENOUS | Status: AC
  Filled 2020-12-01: qty 20

## 2020-12-01 MED ORDER — SODIUM CHLORIDE 0.9 % IV SOLN
INTRAVENOUS | Status: AC | PRN
  Administered 2020-12-01: 1000 mL

## 2020-12-01 MED ORDER — KETOROLAC TROMETHAMINE 30 MG/ML IJ SOLN
INTRAMUSCULAR | Status: DC | PRN
  Administered 2020-12-01: 30 mg via INTRAVENOUS

## 2020-12-01 MED ORDER — KETOROLAC TROMETHAMINE 30 MG/ML IJ SOLN
INTRAMUSCULAR | Status: AC
  Filled 2020-12-01: qty 1

## 2020-12-01 MED ORDER — DEXAMETHASONE SODIUM PHOSPHATE 10 MG/ML IJ SOLN
INTRAMUSCULAR | Status: AC
  Filled 2020-12-01: qty 1

## 2020-12-01 MED ORDER — LACTATED RINGERS IV SOLN: INTRAVENOUS | Status: DC

## 2020-12-01 MED ORDER — EPHEDRINE SULFATE-NACL 50-0.9 MG/10ML-% IV SOSY
PREFILLED_SYRINGE | INTRAVENOUS | Status: DC | PRN
  Administered 2020-12-01: 10 mg via INTRAVENOUS
  Administered 2020-12-01: 5 mg via INTRAVENOUS
  Administered 2020-12-01: 10 mg via INTRAVENOUS
  Administered 2020-12-01: 15 mg via INTRAVENOUS
  Administered 2020-12-01: 10 mg via INTRAVENOUS

## 2020-12-01 MED ORDER — LIDOCAINE 2% (20 MG/ML) 5 ML SYRINGE
INTRAMUSCULAR | Status: AC
  Filled 2020-12-01: qty 5

## 2020-12-01 MED ORDER — FENTANYL CITRATE (PF) 100 MCG/2ML IJ SOLN: 25.0000 ug | INTRAMUSCULAR | Status: DC | PRN

## 2020-12-01 MED ORDER — CIPROFLOXACIN IN D5W 400 MG/200ML IV SOLN
400.0000 mg | INTRAVENOUS | Status: AC
  Administered 2020-12-01: 400 mg via INTRAVENOUS

## 2020-12-01 MED ORDER — LIDOCAINE 2% (20 MG/ML) 5 ML SYRINGE
INTRAMUSCULAR | Status: DC | PRN
  Administered 2020-12-01: 60 mg via INTRAVENOUS

## 2020-12-01 MED ORDER — IOHEXOL 300 MG/ML  SOLN
INTRAMUSCULAR | Status: DC | PRN
  Administered 2020-12-01: 7 mL

## 2020-12-01 MED ORDER — ONDANSETRON HCL 4 MG/2ML IJ SOLN
INTRAMUSCULAR | Status: DC | PRN
  Administered 2020-12-01: 4 mg via INTRAVENOUS

## 2020-12-01 MED ORDER — SODIUM CHLORIDE (PF) 0.9 % IJ SOLN
INTRAMUSCULAR | Status: DC | PRN
  Administered 2020-12-01: 10 mL via INTRAVENOUS

## 2020-12-01 MED ORDER — DOCUSATE SODIUM 100 MG PO CAPS: 100.0000 mg | ORAL_CAPSULE | Freq: Every day | ORAL | 0 refills | Status: DC | PRN

## 2020-12-01 MED ORDER — EPHEDRINE 5 MG/ML INJ
INTRAVENOUS | Status: AC
  Filled 2020-12-01: qty 10

## 2020-12-01 MED ORDER — FENTANYL CITRATE (PF) 100 MCG/2ML IJ SOLN
INTRAMUSCULAR | Status: AC
  Filled 2020-12-01: qty 2

## 2020-12-01 MED ORDER — MIDAZOLAM HCL 2 MG/2ML IJ SOLN
INTRAMUSCULAR | Status: AC
  Filled 2020-12-01: qty 2

## 2020-12-01 MED ORDER — ONDANSETRON HCL 4 MG/2ML IJ SOLN
INTRAMUSCULAR | Status: AC
  Filled 2020-12-01: qty 2

## 2020-12-01 MED ORDER — OXYCODONE-ACETAMINOPHEN 5-325 MG PO TABS: 1.0000 | ORAL_TABLET | ORAL | 0 refills | Status: DC | PRN

## 2020-12-01 MED ORDER — PROPOFOL 10 MG/ML IV BOLUS
INTRAVENOUS | Status: DC | PRN
  Administered 2020-12-01: 30 mg via INTRAVENOUS
  Administered 2020-12-01: 20 mg via INTRAVENOUS
  Administered 2020-12-01: 100 mg via INTRAVENOUS
  Administered 2020-12-01: 150 mg via INTRAVENOUS

## 2020-12-01 MED ORDER — PHENYLEPHRINE 40 MCG/ML (10ML) SYRINGE FOR IV PUSH (FOR BLOOD PRESSURE SUPPORT)
PREFILLED_SYRINGE | INTRAVENOUS | Status: DC | PRN
  Administered 2020-12-01: 80 ug via INTRAVENOUS
  Administered 2020-12-01: 120 ug via INTRAVENOUS
  Administered 2020-12-01 (×2): 80 ug via INTRAVENOUS
  Administered 2020-12-01: 40 ug via INTRAVENOUS

## 2020-12-01 MED ORDER — CIPROFLOXACIN IN D5W 400 MG/200ML IV SOLN
INTRAVENOUS | Status: AC
  Filled 2020-12-01: qty 200

## 2020-12-01 MED ORDER — FENTANYL CITRATE (PF) 100 MCG/2ML IJ SOLN
INTRAMUSCULAR | Status: DC | PRN
  Administered 2020-12-01 (×2): 50 ug via INTRAVENOUS

## 2020-12-01 MED ORDER — DEXAMETHASONE SODIUM PHOSPHATE 10 MG/ML IJ SOLN
INTRAMUSCULAR | Status: DC | PRN
  Administered 2020-12-01: 10 mg via INTRAVENOUS

## 2020-12-01 SURGICAL SUPPLY — 33 items
BAG DRN RND TRDRP ANRFLXCHMBR (UROLOGICAL SUPPLIES) ×2
BAG URINE DRAIN 2000ML AR STRL (UROLOGICAL SUPPLIES) ×3 IMPLANT
BLADE CLIPPER SENSICLIP SURGIC (BLADE) ×3 IMPLANT
CATH FOLEY 2WAY SLVR  5CC 16FR (CATHETERS) ×3
CATH FOLEY 2WAY SLVR 5CC 16FR (CATHETERS) ×2 IMPLANT
CATH ROBINSON RED A/P 20FR (CATHETERS) ×3 IMPLANT
CLOTH BEACON ORANGE TIMEOUT ST (SAFETY) ×3 IMPLANT
CNTNR URN SCR LID CUP LEK RST (MISCELLANEOUS) ×4 IMPLANT
CONT SPEC 4OZ STRL OR WHT (MISCELLANEOUS) ×6
COVER BACK TABLE 60X90IN (DRAPES) ×3 IMPLANT
COVER MAYO STAND STRL (DRAPES) ×3 IMPLANT
DECANTER SPIKE VIAL GLASS SM (MISCELLANEOUS) ×1 IMPLANT
DRAPE C-ARM 35X43 STRL (DRAPES) ×3 IMPLANT
DRSG TEGADERM 4X4.75 (GAUZE/BANDAGES/DRESSINGS) ×6 IMPLANT
DRSG TEGADERM 8X12 (GAUZE/BANDAGES/DRESSINGS) ×4 IMPLANT
GAUZE SPONGE 4X4 12PLY STRL (GAUZE/BANDAGES/DRESSINGS) ×1 IMPLANT
GLOVE SURG ENC MOIS LTX SZ7 (GLOVE) ×3 IMPLANT
GLOVE SURG ORTHO LTX SZ8.5 (GLOVE) ×9 IMPLANT
GLOVE SURG POLYISO LF SZ6.5 (GLOVE) ×2 IMPLANT
GLOVE SURG UNDER POLY LF SZ7 (GLOVE) ×3 IMPLANT
GOWN STRL REUS W/TWL LRG LVL3 (GOWN DISPOSABLE) ×5 IMPLANT
I-Seed AgX100 ×146 IMPLANT
IMPL SPACEOAR VUE SYSTEM (Spacer) ×1 IMPLANT
IMPLANT SPACEOAR VUE SYSTEM (Spacer) ×3 IMPLANT
IV NS 1000ML (IV SOLUTION) ×3
IV NS 1000ML BAXH (IV SOLUTION) ×2 IMPLANT
KIT TURNOVER CYSTO (KITS) ×3 IMPLANT
MARKER SKIN DUAL TIP RULER LAB (MISCELLANEOUS) ×3 IMPLANT
PACK CYSTO (CUSTOM PROCEDURE TRAY) ×3 IMPLANT
SYR 10ML LL (SYRINGE) ×3 IMPLANT
TOWEL OR 17X26 10 PK STRL BLUE (TOWEL DISPOSABLE) ×3 IMPLANT
UNDERPAD 30X36 HEAVY ABSORB (UNDERPADS AND DIAPERS) ×6 IMPLANT
WATER STERILE IRR 500ML POUR (IV SOLUTION) ×3 IMPLANT

## 2020-12-01 NOTE — Discharge Instructions (Signed)
Activity:  You are encouraged to ambulate frequently (about every hour during waking hours) to help prevent blood clots from forming in your legs or lungs.  However, you should not engage in any heavy lifting (> 10-15 lbs), strenuous activity, or straining.   Diet: You should advance your diet as instructed by your physician.  It will be normal to have some bloating, nausea, and abdominal discomfort intermittently.   Prescriptions:  You will be provided a prescription for pain medication to take as needed.  If your pain is not severe enough to require the prescription pain medication, you may take extra strength Tylenol instead which will have less side effects.  You should also take a prescribed stool softener to avoid straining with bowel movements as the prescription pain medication may constipate you.   What to call us about: You should call the office [phone]) if you develop fever > 101 or develop persistent vomiting. Activity:  You are encouraged to ambulate frequently (about every hour during waking hours) to help prevent blood clots from forming in your legs or lungs.  However, you should not engage in any heavy lifting (> 10-15 lbs), strenuous activity, or straining.  No ibuprofen, Advil, Aleve, Motrin, or naproxen until after 7:15 pm today if needed.   Post Anesthesia Home Care Instructions  Activity: Get plenty of rest for the remainder of the day. A responsible individual must stay with you for 24 hours following the procedure.  For the next 24 hours, DO NOT: -Drive a car -Paediatric nurse -Drink alcoholic beverages -Take any medication unless instructed by your physician -Make any legal decisions or sign important papers.  Meals: Start with liquid foods such as gelatin or soup. Progress to regular foods as tolerated. Avoid greasy, spicy, heavy foods. If nausea and/or vomiting occur, drink only clear liquids until the nausea and/or vomiting subsides. Call your physician  if vomiting continues.  Special Instructions/Symptoms: Your throat may feel dry or sore from the anesthesia or the breathing tube placed in your throat during surgery. If this causes discomfort, gargle with warm salt water. The discomfort should disappear within 24 hours.  Radioactive Seed Implant Home Care Instructions   Activity:    Rest for the remainder of the day.  Do not drive or operate equipment today.  You may resume normal  activities in a few days as instructed by your physician, without risk of harmful radiation exposure to those around you, provided you follow the time and distance precautions on the Radiation Oncology Instruction Sheet.   Meals: Drink plenty of lipuids and eat light foods, such as gelatin or soup this evening .  You may return to normal meal plan tomorrow.  Return To Work: You may return to work as instructed by [employer].  Special Instruction:   If any seeds are found, use tweezers to pick up seeds and place in a glass container of any kind and bring to your physician's office.  Call your physician if any of these symptoms occur:   Persistent or heavy bleeding  Urine stream diminishes or stops completely after catheter is removed  Fever equal to or greater than 101 degrees F  Cloudy urine with a strong foul odor  Severe pain  You may feel some burning pain and/or hesitancy when you urinate after the catheter is removed.  These symptoms may increase over the next few weeks, but should diminish within forur to six weeks.  Applying moist heat to the lower abdomen or a  hot tub bath may help relieve the pain.  If the discomfort becomes severe, please call your physician for additional medications.

## 2020-12-01 NOTE — H&P (Signed)
--------------------------------------------------------------------------------   [name] Allen  MRN: [id number]  DOB: 05/16/1951, 69 year old Male  SSN: [id number]   PRIMARY CARE:  Pricilla Holm  REFERRING:  Pricilla Holm  PROVIDER:  Rexene Alberts, M.D.  LOCATION:  [facility] [phone]     --------------------------------------------------------------------------------   CC/HPI: [name] Allen is a 70 year old male seen in consultation with newly diagnosed prostate cancer.   Patient underwent TRUS biopsy on 07/01/2020 for an elevated PSA of 7.02ng/mL on 04/26/2020. TRUS biopsy revealed Gleason 3+4=7 in 1/12 core and Gleason 3+3 = 6 and 5/12 cores, adenocarcinoma the prostate with 6/12 cores positive (2-50%), TRUS volume 57 g. His grandfather had prostate cancer. He denies new or worsening bone or back pain. He has good appetite and stable weight. He is relatively healthy with no other significant past medical history. He has no cardiovascular disease or lung comorbidities. He denies taking anticoagulation. He has erectile dysfunction with inability to obtain or maintain an erection. He does have a history of an IPP placed by Dr. Gloriann Loan on 09/11/2017.   AUA SS: 1  SHIM: 10  ECOG: 0   PCa Dx Date: 07/01/2020  PSA Trend: 7.02ng/mL on 04/26/2020   Patient currently denies fever, chills, sweats, nausea, vomiting, abdominal or flank pain, gross hematuria or dysuria.     ALLERGIES: No Allergies    MEDICATIONS: Tamsulosin Hcl 0.4 mg capsule 1 capsule PO Q HS  Aspirin Ec 81 mg tablet, delayed release Oral  Multivitamin  Pantoprazole Sodium  Trazodone Hcl     GU PSH: Insertion IPP - 2018 Prostate Needle Biopsy - 07/01/2020       PSH Notes: Heart Surgery   NON-GU PSH: Surgical Pathology, Gross And Microscopic Examination For Prostate Needle - 07/01/2020     GU PMH: BPH w/LUTS - 07/08/2020, - 01/22/2020 Nocturia - 07/08/2020, (Stable), - 04/26/2020, - 01/22/2020 Prostate  Cancer, Reviewed and provided copy of biopsy pathology report. Counseled regarding diagnosis, gleason grade system, clinical staging system and patient's own diagnosis grade and stage. Counseled regarding prostate cancer treatment including active surveillance, radiation, and surgery. Counseled patient regarding options for curative treatment of robotic prostatectomy. counseled regarding hospitalization and recovery including indwelling catheter for 7 days and post operative restrictions such as lifting restriction less than 10-15 pounds for 4-6 weeks counseled regarding risks including but not limited to operative risks such as heart attack and stroke and post treatment side effects such as incomplete cure of cancer, need for adjuvant therapy, urinary incontinence and erectile dysfunction requiring treatment or intervention Such interventions for incontinence and ED discussed. provided AUA foundation literature regarding prostate cancer and Discussed the MSK pretreatment nomograms. Patient with [99%] 15 yr CSS and [72%] 10 yr progression free survival after prostatectomy. Also discussed need for lifelong surveillance and options for secondary treatment should that be necessary. [email or web address] patient expressed understanding regarding all above answered all current questions - 07/08/2020 Elevated PSA - 07/01/2020, - 2019, - 2018 Urinary Retention - 09/14/2019 ED due to arterial insufficiency, Erectile dysfunction due to arterial insufficiency - 2016 Primary hypogonadism, Hypogonadism, testicular - 2016 Encounter for Prostate Cancer screening, Prostate cancer screening - 2016 Male ED, unspecified, Organic erectile dysfunction - 2016    NON-GU PMH: Pyuria/other UA findings - 01/22/2020 Encounter for general adult medical examination without abnormal findings, Encounter for preventive health examination - 2016 Anxiety, Anxiety - 2016 Personal history of other  diseases of the circulatory system, History of cardiac disorder - 2016 Personal history of other mental and behavioral  disorders, History of depression - 2016 Personal history of other specified conditions, History of heartburn - 2016    FAMILY HISTORY: Diabetes - Runs In Family kidney disease - Runs In Family   SOCIAL HISTORY: Marital Status: Single Preferred Language: English; Ethnicity: Not Hispanic Or Latino; Race: Black or African American Current Smoking Status: Patient does not smoke anymore. Has not smoked since 09/24/2009.   Tobacco Use Assessment Completed: Used Tobacco in last 30 days? Social Drinker.  Drinks 1 caffeinated drink per day.     Notes: Caffeine use, Single, Father deceased, Retired, Mother deceased, Former smoker, Alcohol use   REVIEW OF SYSTEMS:    GU Review Male:   Patient denies frequent urination, hard to postpone urination, burning/ pain with urination, get up at night to urinate, leakage of urine, stream starts and stops, trouble starting your stream, have to strain to urinate , erection problems, and penile pain.  Gastrointestinal (Upper):   Patient denies nausea, vomiting, and indigestion/ heartburn.  Gastrointestinal (Lower):   Patient denies diarrhea and constipation.  Constitutional:   Patient denies fever, night sweats, weight loss, and fatigue.  Skin:   Patient denies skin rash/ lesion and itching.  Eyes:   Patient denies blurred vision and double vision.  Ears/ Nose/ Throat:   Patient denies sore throat and sinus problems.  Hematologic/Lymphatic:   Patient denies swollen glands and easy bruising.  Cardiovascular:   Patient denies chest pains and leg swelling.  Respiratory:   Patient denies cough and shortness of breath.  Endocrine:   Patient denies excessive thirst.  Musculoskeletal:   Patient denies back pain and joint pain.  Neurological:   Patient denies headaches and dizziness.  Psychologic:   Patient denies depression and anxiety.   VITAL  SIGNS:      07/27/2020 10:16 AM  Weight 167 lb / 75.75 kg  Height 67 in / 170.18 cm  BP 120/75 mmHg  Pulse 73 /min  Temperature 97.1 F / 36.1 C  BMI 26.2 kg/m   GU PHYSICAL EXAMINATION:    Scrotum: No lesions. No edema. No cysts. No warts. Pump in good position.  Epididymides: Right: no spermatocele, no masses, no cysts, no tenderness, no induration, no enlargement. Left: no spermatocele, no masses, no cysts, no tenderness, no induration, no enlargement.  Testes: No tenderness, no swelling, no enlargement left testes. No tenderness, no swelling, no enlargement right testes. Normal location left testes. Normal location right testes. No mass, no cyst, no varicocele, no hydrocele left testes. No mass, no cyst, no varicocele, no hydrocele right testes.  Penis: Prosthesis in good position.   MULTI-SYSTEM PHYSICAL EXAMINATION:    Constitutional: Well-nourished. No physical deformities. Normally developed. Good grooming.  Respiratory: No labored breathing, no use of accessory muscles.   Cardiovascular: Normal temperature, normal extremity pulses, no swelling, no varicosities.  Gastrointestinal: No mass, no tenderness, no rigidity, non obese abdomen.     Complexity of Data:  Source Of History:  Patient, Medical Record Summary  Lab Test Review:   PSA  Records Review:   AUA Symptom Score, Pathology Reports, Previous Doctor Records, Previous Patient Records, IIEF Score  Urine Test Review:   Urinalysis   04/26/20 07/25/17 05/31/15  PSA  Total PSA 7.02 ng/mL 4.18 ng/mL 3.40     10/26/15 06/01/15  Hormones  Testosterone, Total 565  248     PROCEDURES:          Urinalysis Dipstick Dipstick Cont'd  Color: Yellow Bilirubin: Neg mg/dL  Appearance: Clear Ketones: Neg  mg/dL  Specific Gravity: 1.025 Blood: Neg ery/uL  pH: 6.0 Protein: Trace mg/dL  Glucose: Neg mg/dL Urobilinogen: 0.2 mg/dL    Nitrites: Neg    Leukocyte Esterase: Neg leu/uL    ASSESSMENT:      ICD-10 Details  1 GU:    Prostate Cancer - C61   2   ED due to arterial insufficiency - N52.01    PLAN:           Schedule Return Visit/Planned Activity: 1 Month - Office Visit          Document Letter(s):  Created for Patient: Clinical Summary         Notes:   1. Newly diagnosed [id number] Gleason 3+4=7 in 1/12 core and Gleason 3+3 = 6 and 5/12 cores, adenocarcinoma the prostate with 6/12 cores positive (2-50%), TRUS volume 57 g. prebiopsy PSA = 7.02ng/mL on 04/26/2020  -Reviewed pathology report in detail with patient today. Explained that the patient has favorable intermediate risk disease based on his pathology report.  -Discussed treatment options for localized prostate cancer to include RALP vs XRT +/- ADT  -He is now considering brachytherapy and is no longer desiring robotic assisted laparoscopic prostatectomy.  -Referral to radiation oncology for evaluation of radiation options  -All questions answered today  -RTC in 4 weeks to discuss treatment decision   Discussion:  Using NCCN risk stratification criteria, his disease is considered favorable intermediate risk. We discussed his diagnosis in detail, reviewing the significance of gleason score, PSA, DRE, and percentage of cores positive. We discussed the various management options for prostate cancer, including active surveillance, open and robotic radical prostatectomy, EBRT, brachytherapy, proton therapy, and cryotherapy. We discussed the generally indolent course of many prostate cancers, and the generally favorable oncologic control offered by all treatment strategies. However, I emphasized that all treatments offer only potential for cure and that in many cases multimodal therapy may ultimately be utilized for long term cancer control. We also discussed the impact of treatment on sexual and urinary function. I emphasized that each treatment has unique quality of life impact and recovery profiles, and we reviewed the possible effects of surgery, radiation, and  cryotherapy on quality of life outcomes. All questions were answered.   2. Erectile dysfunction: S/p IPP by Dr. Gloriann Loan on 09/11/2017.   CC: Pricilla Holm    Urology Preoperative H&P   Chief Complaint: prostate cancer   History of Present Illness: [name] Allen is a 70 y.o. male with prostate cancer here for brachytherapy, TRUS and SpaceOAR injection.    Past Medical History:  Diagnosis Date  . Anxiety   . Coronary artery disease previouly seen by cardiologist--- dr Marlou Porch, lov in epic 11-08-2016 was released prn and follow up with pcp   cardiac cath 05-18-2004 severe 1V CAD of LAD & nonobstructive cad RCA/ Cfx;  05-19-2004 s/p CABG x1 ,  LIMA to distalLAD //    recurrent chest pain  LHC 03/26/12-- nonobOstial LAD,patent graft, minor irregularities Cfx, PCI/ DES x1 to midRCA 99% EF 55-65%//  nuclear study 11-12-2012  normal w/ no ischemia and normal LVF and wall motion, nuclear ef 58%  . Depression   . GERD (gastroesophageal reflux disease)   . History of 2019 novel coronavirus disease (COVID-19) 11/10/2020   pt tested positive for covid, test done for surgery;  per pt was asymptomatic  . HLD (hyperlipidemia)   . Hypertension    followed by pcp---  currently treated w/ diet, no medication since 2020  .  Prostate cancer [facility]) urologist--- dr Denesha Brouse/  oncologist--- dr Tammi Klippel   dx 10/ 2021,  Stage T1c, Gleason 3+4, PSA 7.02  . Vitamin D deficiency     Past Surgical History:  Procedure Laterality Date  . CARDIAC CATHETERIZATION  05-18-2004  @[facility]    1V CAD involving distal LAD ,  nonobstructive cad involving RCA/ CFx,  ef 45-50%,  anterior and inferior apical hypokinesis  . COLONOSCOPY WITH PROPOFOL  last one 11-22-2017  . CORONARY ARTERY BYPASS GRAFT  05-19-2004  dr Roxy Manns @[facility]    x1  LIMA to distal LAD  . INGUINAL HERNIA REPAIR Right 03-29-2008  @[facility]   . LEFT HEART CATHETERIZATION WITH CORONARY/GRAFT ANGIOGRAM  03/26/2012   Procedure: LEFT HEART CATHETERIZATION WITH Beatrix Fetters;   Surgeon: Peter M Martinique, MD;  Location: [facility] CATH LAB;  Service: Cardiovascular;;  . PENILE PROSTHESIS IMPLANT N/A 09/11/2017   Procedure: PENILE PROTHESIS INFLATABLE AMS;  Surgeon: Lucas Mallow, MD;  Location: [facility];  Service: Urology;  Laterality: N/A;  . PERCUTANEOUS CORONARY STENT INTERVENTION (PCI-S)  03/26/2012   Procedure: PERCUTANEOUS CORONARY STENT INTERVENTION (PCI-S);  Surgeon: Peter M Martinique, MD;  Location: [facility] CATH LAB;  Service: Cardiovascular;;    Allergies: No Known Allergies  Family History  Problem Relation Age of Onset  . Heart disease Mother   . Depression Mother   . Diabetes Mother   . Early death Mother   . Hypertension Mother   . Hyperlipidemia Mother   . Kidney disease Father   . Early death Father   . Cancer Father   . Prostate cancer Father   . Hypertension Sister   . Colon cancer Neg Hx   . Esophageal cancer Neg Hx   . Ovarian cancer Neg Hx   . Pancreatic cancer Neg Hx   . Rectal cancer Neg Hx   . Stomach cancer Neg Hx     Social History:  reports that he quit smoking about 19 years ago. His smoking use included cigarettes. He has a 3.00 pack-year smoking history. He has never used smokeless tobacco. He reports previous alcohol use. He reports previous drug use. Drug: Marijuana.  ROS: A complete review of systems was performed.  All systems are negative except for pertinent findings as noted.  Physical Exam:  Vital signs in last 24 hours: Temp:  [97.5 F (36.4 C)] 97.5 F (36.4 C) (03/10 1005) Pulse Rate:  [79] 79 (03/10 1005) Resp:  [16] 16 (03/10 1005) BP: (119)/(82) 119/82 (03/10 1005) SpO2:  [98 %] 98 % (03/10 1005) Weight:  [76.7 kg] 76.7 kg (03/10 1005) Constitutional:  Alert and oriented, No acute distress Cardiovascular: Regular rate and rhythm Respiratory: Normal respiratory effort, Lungs clear bilaterally GI: Abdomen is soft, nontender, nondistended, no abdominal masses GU: No CVA tenderness Lymphatic: No  lymphadenopathy Neurologic: Grossly intact, no focal deficits Psychiatric: Normal mood and affect  Laboratory Data:  No results for input(s): WBC, HGB, HCT, PLT in the last 72 hours.  No results for input(s): NA, K, CL, GLUCOSE, BUN, CALCIUM, CREATININE in the last 72 hours.  Invalid input(s): CO3   No results found for this or any previous visit (from the past 24 hour(s)). No results found for this or any previous visit (from the past 240 hour(s)).  Renal Function: No results for input(s): CREATININE in the last 168 hours. Estimated Creatinine Clearance: 59.8 mL/min (by C-G formula based on SCr of 1.06 mg/dL).  Radiologic Imaging: No results found.  I independently reviewed the above imaging  studies.  Assessment and Plan [name] Allen is a 70 y.o. male with  prostate cancer here for brachytherapy, TRUS and SpaceOAR injection.  Brachytherapy/space OAR consent- The patient was counseled about the natural history of prostate cancer and the standard treatment options that are available for prostate cancer. It was explained to him how his age and life expectancy, clinical stage, Gleason score, and PSA affect his prognosis, the decision to proceed with additional staging studies, as well as how that information influences recommended treatment strategies. We discussed the roles for active surveillance, radiation therapy, surgical therapy, androgen deprivation, as well as ablative therapy options for the treatment of prostate cancer as appropriate to his individual cancer situation. We discussed the risks and benefits of these options with regard to their impact on cancer control and also in terms of potential adverse events, complications, and impact on quality of life particularly related to urinary and sexual function. The patient was encouraged to ask questions throughout the discussion today and all questions were answered to his stated satisfaction. In addition, the patient was provided  with and/or directed to appropriate resources and literature for further education about prostate cancer and treatment options.   The patient has decided to proceed with brachytherapy and SpaceOAR placement as primary treatment of his intermediate risk prostate cancer.  The risks, benefits and alternatives of the aforementioned procedures was discussed in detail.  Risks include, bur are not limited to worsening LUTS, erectile dysfunction, rectal irritation, urethral stricture formation, fistula formation, cancer recurrence, MI, CVA, PE, DVT and the inherent risk of general anesthesia.  He voices understanding and wishes to proceed.   Matt R. Favian Kittleson MD 12/01/2020, 11:47 AM  [facility] Pager: [phone]): [phone]

## 2020-12-01 NOTE — Progress Notes (Signed)
  Radiation Oncology         [phone] ________________________________  Name: [name] Allen MRN: [id number]  Date: 12/01/2020  DOB: 1951-01-27       Prostate Seed Implant  VZ:DGLO, Hunt Oris, MD  No ref. provider found  DIAGNOSIS:   70 y.o. gentleman with Stage T1c adenocarcinoma of the prostate with Gleason score of 3+4, and PSA of 7.02. Oncology History  Malignant neoplasm of prostate ([place])  07/01/2020 Cancer Staging   Staging form: Prostate, AJCC 8th Edition - Clinical stage from 07/01/2020: Stage IIB (cT1c, cN0, cM0, PSA: 7, Grade Group: 2) - Signed by Freeman Caldron, PA-C on 08/26/2020   08/26/2020 Initial Diagnosis   Malignant neoplasm of prostate ([place])       ICD-10-CM   1. Prostate cancer [facility])  [place] Discharge patient    PROCEDURE: Insertion of radioactive I-125 seeds into the prostate gland.  RADIATION DOSE: 145 Gy, definitive therapy.  TECHNIQUE: [name] Allen was brought to the operating room with the urologist. He was placed in the dorsolithotomy position. He was catheterized and a rectal tube was inserted. The perineum was shaved, prepped and draped. The ultrasound probe was then introduced into the rectum to see the prostate gland.  TREATMENT DEVICE: A needle grid was attached to the ultrasound probe stand and anchor needles were placed.  3D PLANNING: The prostate was imaged in 3D using a sagittal sweep of the prostate probe. These images were transferred to the planning computer. There, the prostate, urethra and rectum were defined on each axial reconstructed image. Then, the software created an optimized 3D plan and a few seed positions were adjusted. The quality of the plan was reviewed using [facility] information for the target and the following two organs at risk:  Urethra and Rectum.  Then the accepted plan was printed and handed off to the radiation therapist.  Under my supervision, the custom loading of the seeds and spacers was carried out and loaded into sealed vicryl  sleeves.  These pre-loaded needles were then placed into the needle holder.[name]  PROSTATE VOLUME STUDY:  Using transrectal ultrasound the volume of the prostate was verified to be 64 cc.  SPECIAL TREATMENT PROCEDURE/SUPERVISION AND HANDLING: The pre-loaded needles were then delivered under sagittal guidance. A total of 18 needles were used to deposit 73 seeds in the prostate gland. The individual seed activity was 0.576 mCi.  SpaceOAR:  Yes  COMPLEX SIMULATION: At the end of the procedure, an anterior radiograph of the pelvis was obtained to document seed positioning and count. Cystoscopy was performed to check the urethra and bladder.  MICRODOSIMETRY: At the end of the procedure, the patient was emitting 0.15 mR/hr at 1 meter. Accordingly, he was considered safe for hospital discharge.  PLAN: The patient will return to the radiation oncology clinic for post implant CT dosimetry in three weeks.   ________________________________  Sheral Apley Tammi Klippel, M.D.

## 2020-12-01 NOTE — Anesthesia Procedure Notes (Signed)
Procedure Name: LMA Insertion Date/Time: 12/01/2020 12:09 PM Performed by: Mechele Claude, CRNA Pre-anesthesia Checklist: Patient identified, Emergency Drugs available, Suction available and Patient being monitored Patient Re-evaluated:Patient Re-evaluated prior to induction Oxygen Delivery Method: Circle system utilized Preoxygenation: Pre-oxygenation with 100% oxygen Induction Type: IV induction Ventilation: Mask ventilation without difficulty LMA: LMA inserted LMA Size: 4.0 Number of attempts: 1 Airway Equipment and Method: Bite block Placement Confirmation: positive ETCO2 Tube secured with: Tape Dental Injury: Teeth and Oropharynx as per pre-operative assessment  Comments: Patient denied dentures during pre-op interview. Upper and lower dentures noted upon LMA placement.  Dentures secured in place well.  Will leave dentures in place and use extreme caution when removing LMA.

## 2020-12-01 NOTE — Op Note (Signed)
PATIENT:  [name] Allen  PRE-OPERATIVE DIAGNOSIS:  Adenocarcinoma of the prostate  POST-OPERATIVE DIAGNOSIS:  Same  PROCEDURE:  1. I-125 radioactive seed implantation 2. Cystoscopy  3. Placement of SpaceOAR  SURGEON:  Rexene Alberts, MD  Radiation oncologist: Tyler Pita, MD  ANESTHESIA:  General  EBL:  Minimal  DRAINS: None  INDICATION: [name] Allen  Description of procedure: After informed consent the patient was brought to the major OR, placed on the table and administered general anesthesia. He was then moved to the modified lithotomy position with his perineum perpendicular to the floor. His perineum and genitalia were then sterilely prepped. An official timeout was then performed. A 16 French Foley catheter was then placed in the bladder and filled with dilute contrast, a rectal tube was placed in the rectum and the transrectal ultrasound probe was placed in the rectum and affixed to the stand. He was then sterilely draped.  Real time ultrasonography was used along with the seed planning software. This was used to develop the seed plan including the number of needles as well as number of seeds required for complete and adequate coverage. Real-time ultrasonography was then used along with the previously developed plan and the Nucletron device to implant a total of 73 seeds using 18 needles. This proceeded without difficulty or complication. We made sure to stay well away from previously placed penile prosthesis.  I then proceeded with placement of SpaceOAR by introducing a needle with the bevel angled inferiorly approximately 2 cm superior to the anus. This was angled downward and under direct ultrasound was placed within the space between the prostatic capsule and rectum. This was confirmed with a small amount of sterile saline injected and this was performed under direct ultrasound. I then attached the SpaceOAR to the needle and injected this in the space between the prostate and  rectum with good placement noted.  A Foley catheter was then removed as well as the transrectal ultrasound probe and rectal probe. Flexible cystoscopy was then performed using the 16 French flexible scope which revealed a normal urethra throughout its length down to the sphincter which appeared intact. The prostatic urethra revealed bilobar hypertrophy but no evidence of obstruction, seeds, spacers or lesions. The bladder was then entered and fully and systematically inspected. The ureteral orifices were noted to be of normal configuration and position. The mucosa revealed no evidence of tumors. There were also no stones identified within the bladder. I noted no seeds or spacers on the floor of the bladder and retroflexion of the scope revealed no seeds protruding from the base of the prostate.  The cystoscope was then removed and the patient was awakened and taken to recovery room in stable and satisfactory condition. He tolerated procedure well and there were no intraoperative complications.  [name]  Pager: [phone]

## 2020-12-01 NOTE — Anesthesia Preprocedure Evaluation (Addendum)
Anesthesia Evaluation  Patient identified by MRN, date of birth, ID band Patient awake    Reviewed: Allergy & Precautions, NPO status , Patient's Chart, lab work & pertinent test results  Airway Mallampati: II  TM Distance: >3 FB Neck ROM: Full    Dental  (+) Dental Advisory Given, Edentulous Upper, Edentulous Lower, Lower Dentures, Upper Dentures   Pulmonary neg pulmonary ROS, Patient abstained from smoking., former smoker,    Pulmonary exam normal breath sounds clear to auscultation       Cardiovascular Exercise Tolerance: Good hypertension, Pt. on medications + CAD, + Past MI, + Cardiac Stents and + CABG  Normal cardiovascular exam Rhythm:Regular Rate:Normal  Overall Impression:  Normal stress nuclear study.  LV Ejection Fraction: 58%.  LV Wall Motion:  NL LV Function; NL Wall Motion    Neuro/Psych PSYCHIATRIC DISORDERS Anxiety Depression negative neurological ROS     GI/Hepatic Neg liver ROS, GERD  ,  Endo/Other  negative endocrine ROS  Renal/GU negative Renal ROS     Musculoskeletal negative musculoskeletal ROS (+)   Abdominal   Peds  Hematology negative hematology ROS (+)   Anesthesia Other Findings   Reproductive/Obstetrics negative OB ROS                           Anesthesia Physical  Anesthesia Plan  ASA: III  Anesthesia Plan: General   Post-op Pain Management:    Induction: Intravenous  PONV Risk Score and Plan: 3 and Ondansetron, Dexamethasone and Scopolamine patch - Pre-op  Airway Management Planned: LMA  Additional Equipment: None  Intra-op Plan:   Post-operative Plan: Extubation in OR  Informed Consent: I have reviewed the patients History and Physical, chart, labs and discussed the procedure including the risks, benefits and alternatives for the proposed anesthesia with the patient or authorized representative who has indicated his/her understanding and  acceptance.     Dental advisory given  Plan Discussed with: CRNA  Anesthesia Plan Comments:       Anesthesia Quick Evaluation

## 2020-12-01 NOTE — Transfer of Care (Signed)
Immediate Anesthesia Transfer of Care Note  Patient: [name] Allen  Procedure(s) Performed: Procedure(s) (LRB): RADIOACTIVE SEED IMPLANT/BRACHYTHERAPY IMPLANT/ WITH CYSTOSCOPY (N/A) SPACE OAR INSTILLATION (N/A)  Patient Location: PACU  Anesthesia Type: General  Level of Consciousness: awake, alert  and oriented  Airway & Oxygen Therapy: Patient Spontanous Breathing and Patient connected to nasal cannula oxygen  Post-op Assessment: Report given to PACU RN and Post -op Vital signs reviewed and stable  Post vital signs: Reviewed and stable  Complications: No apparent anesthesia complications  Last Vitals:  Vitals Value Taken Time  BP 159/75 12/01/20 1345  Temp    Pulse 97 12/01/20 1346  Resp 25 12/01/20 1346  SpO2 100 % 12/01/20 1346  Vitals shown include unvalidated device data.  Last Pain:  Vitals:   12/01/20 1005  TempSrc: Oral  PainSc: 0-No pain      Patients Stated Pain Goal: 3 (63/01/60 1093)  Complications: No complications documented.

## 2020-12-02 ENCOUNTER — Encounter (HOSPITAL_BASED_OUTPATIENT_CLINIC_OR_DEPARTMENT_OTHER): Payer: Self-pay | Admitting: Urology

## 2020-12-02 NOTE — Anesthesia Postprocedure Evaluation (Signed)
Anesthesia Post Note  Patient: CORDALE MANERA  Procedure(s) Performed: RADIOACTIVE SEED IMPLANT/BRACHYTHERAPY IMPLANT/ WITH CYSTOSCOPY (N/A Prostate) SPACE OAR INSTILLATION (N/A Rectum)     Patient location during evaluation: PACU Anesthesia Type: General Level of consciousness: sedated and patient cooperative Pain management: pain level controlled Vital Signs Assessment: post-procedure vital signs reviewed and stable Respiratory status: spontaneous breathing Cardiovascular status: stable Anesthetic complications: no   No complications documented.  Last Vitals:  Vitals:   12/01/20 1415 12/01/20 1445  BP: (!) 154/72 (!) 145/85  Pulse: 72 66  Resp: 16 15  Temp:  36.7 C  SpO2: 95% 99%    Last Pain:  Vitals:   12/02/20 0930  TempSrc:   PainSc: 0-No pain                 Nolon Nations

## 2020-12-26 ENCOUNTER — Encounter: Payer: Self-pay | Admitting: Urology

## 2020-12-26 ENCOUNTER — Telehealth: Payer: Self-pay

## 2020-12-26 ENCOUNTER — Other Ambulatory Visit: Payer: Self-pay

## 2020-12-26 NOTE — Telephone Encounter (Signed)
Spoke with patient in regards to post seed appointment with Freeman Caldron PA on 12/28/20 @ 9:00am. Patient verbalized understanding of appointment date and time. Called to review meaningful use questions. TM

## 2020-12-27 ENCOUNTER — Telehealth: Payer: Self-pay | Admitting: *Deleted

## 2020-12-27 NOTE — Telephone Encounter (Signed)
CALLED PATIENT TO REMIND OF POST SEED APPTS. FOR 12-28-20, SPOKE WITH PATIENT AND HE IS AWARE OF THESE APPTS.

## 2020-12-28 ENCOUNTER — Ambulatory Visit
Admission: RE | Admit: 2020-12-28 | Discharge: 2020-12-28 | Disposition: A | Discharge status: Home / Self Care | Payer: 59 | Source: Ambulatory Visit | Attending: Urology | Admitting: Urology

## 2020-12-28 ENCOUNTER — Other Ambulatory Visit: Payer: Self-pay

## 2020-12-28 ENCOUNTER — Ambulatory Visit
Admission: RE | Admit: 2020-12-28 | Discharge: 2020-12-28 | Disposition: A | Discharge status: Home / Self Care | Payer: 59 | Source: Ambulatory Visit | Attending: Radiation Oncology | Admitting: Radiation Oncology

## 2020-12-28 VITALS — BP 116/89 | HR 66 | Temp 97.9°F | Resp 18 | Ht 67.0 in | Wt 167.8 lb

## 2020-12-28 DIAGNOSIS — C61 Malignant neoplasm of prostate: Secondary | ICD-10-CM | POA: Diagnosis not present

## 2020-12-28 DIAGNOSIS — Z510 Encounter for antineoplastic radiation therapy: Secondary | ICD-10-CM | POA: Insufficient documentation

## 2020-12-28 NOTE — Progress Notes (Signed)
Radiation Oncology         [phone] ________________________________  Name: [name] Allen MRN: [id number]  Date: 12/28/2020  DOB: 12-03-1950  Post-Seed Follow-Up Visit Note  CC: Biagio Borg, MD  Biagio Borg, MD  Diagnosis:   70 y.o. gentleman with Stage T1c adenocarcinoma of the prostate with Gleason score of 3+4, and PSA of 7.02.    ICD-10-CM   1. Malignant neoplasm of prostate (HCC)  C61     Interval Since Last Radiation:  4 weeks 12/01/20:  Insertion of radioactive I-125 seeds into the prostate gland; 145 Gy, definitive therapy with placement of SpaceOAR gel.  Narrative:  The patient returns today for routine follow-up.  He is complaining of increased urinary frequency and urinary hesitation symptoms. He filled out a questionnaire regarding urinary function today providing and overall IPSS score of 15 characterizing his symptoms as moderate with nocturia x2-3/night, weak stream, hesitancy and occasional straining to start his steam.  He specifically denies dysuria, gross hematuria, urgency or incontinence.  His pre-implant score was 1.  He is taking Flomax daily as prescribed and reports gradual improvement in the LUTS.  He denies any abdominal pain or bowel symptoms.  He is experiencing some rectal burning externally but has not tried any medication to manage this.  He reports a healthy appetite and is maintaining his weight.  He denies any significant change in his energy level and is overall, pleased with his progress to date.  ALLERGIES:  has No Known Allergies.  Meds: Current Outpatient Medications  Medication Sig Dispense Refill  . aspirin 81 MG tablet Take 81 mg by mouth daily.    [name] docusate sodium (COLACE) 100 MG capsule Take 1 capsule (100 mg total) by mouth daily as needed for up to 30 doses. 30 capsule 0  . Multiple Vitamin (MULTIVITAMIN) tablet Take 1 tablet by mouth daily.     [name] oxyCODONE-acetaminophen (PERCOCET) 5-325 MG tablet Take 1 tablet by mouth every 4 (four)  hours as needed for up to 18 doses for severe pain. 18 tablet 0  . tamsulosin (FLOMAX) 0.4 MG CAPS capsule Take 0.4 mg by mouth daily after supper.    . traZODone (DESYREL) 50 MG tablet TAKE 1 TABLET (50 MG TOTAL) AT BEDTIME BY MOUTH. 90 tablet 3  . atorvastatin (LIPITOR) 40 MG tablet Take 1 tablet (40 mg total) by mouth daily. (Patient not taking: Reported on 12/26/2020) 90 tablet 3   No current facility-administered medications for this encounter.    Physical Findings: In general this is a well appearing African-American male in no acute distress. [name] alert and oriented x4 and appropriate throughout the examination. Cardiopulmonary assessment is negative for acute distress and he exhibits normal effort.   Lab Findings: Lab Results  Component Value Date   WBC 10.4 11/23/2020   HGB 14.1 11/23/2020   HCT 43.5 11/23/2020   MCV 89.0 11/23/2020   PLT 347 11/23/2020    Radiographic Findings:  Patient underwent CT imaging in our clinic for post implant dosimetry. The CT will be reviewed by Dr. Tammi Klippel to confirm there is an adequate distribution of radioactive seeds throughout the prostate gland and ensure that there are no seeds in or near the rectum.  We suspect the final radiation plan and dosimetry will show appropriate coverage of the prostate gland. He understands that we will call and inform him of any unexpected findings on further review of his imaging and dosimetry.  Impression/Plan: 70 y.o. gentleman with Stage T1c  adenocarcinoma of the prostate with Gleason score of 3+4, and PSA of 7.02. The patient is recovering from the effects of radiation. His urinary symptoms should gradually improve over the next 4-6 months. We talked about this today. He is having some rectal irritation, mostly externally, so we discussed using Tuck's pads and/or preparation H prn to help with the inflammation.  He is encouraged by his improvement in LUTS already and is otherwise pleased with his outcome. He will  continue taking Flomax daily as prescribed until his next follow up with Urology and at that time, can discuss duration of therapy. We also talked about long-term follow-up for prostate cancer following seed implant. He understands that ongoing PSA determinations and digital rectal exams will help perform surveillance to rule out disease recurrence. He does not have a follow up appointment scheduled with Dr. Abner Greenspan to his knowledge. He understands what to expect with his PSA measures. Patient was also educated today about some of the long-term effects from radiation including a small risk for rectal bleeding and possibly erectile dysfunction. We talked about some of the general management approaches to these potential complications. However, I did encourage the patient to contact our office or return at any point if he has questions or concerns related to his previous radiation and prostate cancer.    Nicholos Johns, PA-C

## 2020-12-28 NOTE — Progress Notes (Signed)
Patient has a post seed appointment today with [employer] PA. Patient denies having pain. Patient states that it is painful when he urinates. Patient states is last about 7 seconds. Patient states that it is at the start of urination. Patient denies hematuria. Patient states that he does not have a follow-up with the urologist. Advised patient to make a follow-up appointment.

## 2020-12-28 NOTE — Progress Notes (Signed)
  Radiation Oncology         [phone] ________________________________  Name: [name] Allen MRN: [id number]  Date: 12/28/2020  DOB: [birth year]  COMPLEX SIMULATION NOTE  NARRATIVE:  The patient was brought to the [facility] today following prostate seed implantation approximately one month ago.  Identity was confirmed.  All relevant records and images related to the planned course of therapy were reviewed.  Then, the patient was set-up supine.  CT images were obtained.  The CT images were loaded into the planning software.  Then the prostate and rectum were contoured.  Treatment planning then occurred.  The implanted iodine 125 seeds were identified by the physics staff for projection of radiation distribution  I have requested : 3D Simulation  I have requested a DVH of the following structures: Prostate and rectum.    ________________________________  Sheral Apley Tammi Klippel, M.D.

## 2021-01-02 ENCOUNTER — Other Ambulatory Visit: Payer: Self-pay

## 2021-01-02 DIAGNOSIS — Z1211 Encounter for screening for malignant neoplasm of colon: Secondary | ICD-10-CM

## 2021-01-10 ENCOUNTER — Encounter: Payer: Self-pay | Admitting: Radiation Oncology

## 2021-01-10 DIAGNOSIS — C61 Malignant neoplasm of prostate: Secondary | ICD-10-CM | POA: Diagnosis not present

## 2021-01-30 NOTE — Progress Notes (Signed)
  Radiation Oncology         [phone] ________________________________  Name: [name] Allen MRN: [id number]  Date: 01/10/2021  DOB: 1951-07-11  3D Planning Note   Prostate Brachytherapy Post-Implant Dosimetry  Diagnosis: 70 y.o. gentleman with Stage T1c adenocarcinoma of the prostate with Gleason score of 3+4, and PSA of 7.02.  Narrative: On a previous date, [name] Allen returned following prostate seed implantation for post implant planning. He underwent CT scan complex simulation to delineate the three-dimensional structures of the pelvis and demonstrate the radiation distribution.  Since that time, the seed localization, and complex isodose planning with dose volume histograms have now been completed.  Results:   Prostate Coverage - The dose of radiation delivered to the 90% or more of the prostate gland (D90) was 101.01% of the prescription dose. This exceeds our goal of greater than 90%. Rectal Sparing - The volume of rectal tissue receiving the prescription dose or higher was 0.0 cc. This falls under our thresholds tolerance of 1.0 cc.  Impression: The prostate seed implant appears to show adequate target coverage and appropriate rectal sparing.  Plan:  The patient will continue to follow with urology for ongoing PSA determinations. I would anticipate a high likelihood for local tumor control with minimal risk for rectal morbidity.  ________________________________  Sheral Apley Tammi Klippel, M.D.

## 2021-03-06 ENCOUNTER — Other Ambulatory Visit: Payer: Self-pay

## 2021-03-07 ENCOUNTER — Ambulatory Visit (INDEPENDENT_AMBULATORY_CARE_PROVIDER_SITE_OTHER): Payer: 59 | Admitting: Internal Medicine

## 2021-03-07 ENCOUNTER — Encounter: Payer: Self-pay | Admitting: Internal Medicine

## 2021-03-07 VITALS — BP 140/78 | HR 75 | Temp 97.7°F | Ht 67.0 in | Wt 165.0 lb

## 2021-03-07 DIAGNOSIS — E7800 Pure hypercholesterolemia, unspecified: Secondary | ICD-10-CM

## 2021-03-07 DIAGNOSIS — I739 Peripheral vascular disease, unspecified: Secondary | ICD-10-CM | POA: Insufficient documentation

## 2021-03-07 DIAGNOSIS — R252 Cramp and spasm: Secondary | ICD-10-CM

## 2021-03-07 DIAGNOSIS — F17200 Nicotine dependence, unspecified, uncomplicated: Secondary | ICD-10-CM | POA: Diagnosis not present

## 2021-03-07 MED ORDER — ROSUVASTATIN CALCIUM 20 MG PO TABS: 20.0000 mg | ORAL_TABLET | ORAL | 3 refills | Status: DC

## 2021-03-07 NOTE — Assessment & Plan Note (Signed)
Ok to stop lipitor, cont to follow

## 2021-03-07 NOTE — Progress Notes (Signed)
Patient ID: [name] Allen, male   DOB: 10/11/1950, 70 y.o.   MRN: [id number]        Chief Complaint: left more than right leg pain       HPI:  [name] Allen is a 70 y.o. male here with c/o left more than right distal leg pain with exertion such as pulling a motorcycle cart a certain distance where he has to stop half way to rest and recover, then start again.  Still smoking occasional cigs, not ready to quit compeltely though has quit in the past. Pt denies chest pain, increased sob or doe, wheezing, orthopnea, PND, increased LE swelling, palpitations, dizziness or syncope.   Pt denies polydipsia, polyuria, or new focal neuro s/s.  Cannot tolerate lipitor due to cramping, so stopped taking.  Willing to try a different statin.   Pt denies fever, wt loss, night sweats, loss of appetite, or other constitutional symptoms         Wt Readings from Last 3 Encounters:  03/07/21 165 lb (74.8 kg)  12/28/20 167 lb 12.8 oz (76.1 kg)  12/01/20 169 lb (76.7 kg)   BP Readings from Last 3 Encounters:  03/07/21 140/78  12/28/20 116/89  12/01/20 (!) 145/85         Past Medical History:  Diagnosis Date   Anxiety    Coronary artery disease previouly seen by cardiologist--- dr Rolla Plate in epic 11-08-2016 was released prn and follow up with pcp   cardiac cath 05-18-2004 severe 1V CAD of LAD & nonobstructive cad RCA/ Cfx;  05-19-2004 s/p CABG x1 ,  LIMA to distalLAD //    recurrent chest pain  LHC 03/26/12-- nonobOstial LAD,patent graft, minor irregularities Cfx, PCI/ DES x1 to midRCA 99% EF 55-65%//  nuclear study 11-12-2012  normal w/ no ischemia and normal LVF and wall motion, nuclear ef 58%   Depression    GERD (gastroesophageal reflux disease)    History of 2019 novel coronavirus disease (COVID-19) 11/10/2020   pt tested positive for covid, test done for surgery;  per pt was asymptomatic   HLD (hyperlipidemia)    Hypertension    followed by pcp---  currently treated w/ diet, no medication since 2020    Prostate cancer [facility]) urologist--- dr gay/  oncologist--- dr Tammi Klippel   dx 10/ 2021,  Stage T1c, Gleason 3+4, PSA 7.02   Vitamin D deficiency    Past Surgical History:  Procedure Laterality Date   CARDIAC CATHETERIZATION  05-18-2004  @[facility]    1V CAD involving distal LAD ,  nonobstructive cad involving RCA/ CFx,  ef 45-50%,  anterior and inferior apical hypokinesis   COLONOSCOPY WITH PROPOFOL  last one 11-22-2017   CORONARY ARTERY BYPASS GRAFT  05-19-2004  dr Roxy Manns @[facility]    x1  LIMA to distal LAD   INGUINAL HERNIA REPAIR Right 03-29-2008  @[facility]    LEFT HEART CATHETERIZATION WITH CORONARY/GRAFT ANGIOGRAM  03/26/2012   Procedure: LEFT HEART CATHETERIZATION WITH Beatrix Fetters;  Surgeon: Peter M Martinique, MD;  Location: [facility] CATH LAB;  Service: Cardiovascular;;   PENILE PROSTHESIS IMPLANT N/A 09/11/2017   Procedure: PENILE PROTHESIS INFLATABLE AMS;  Surgeon: Lucas Mallow, MD;  Location: [facility];  Service: Urology;  Laterality: N/A;   PERCUTANEOUS CORONARY STENT INTERVENTION (PCI-S)  03/26/2012   Procedure: PERCUTANEOUS CORONARY STENT INTERVENTION (PCI-S);  Surgeon: Peter M Martinique, MD;  Location: [facility] C[facility] LAB;  Service: Cardiovascular;;   RADIOACTIVE SEED IMPLANT N/A 12/01/2020   Procedure: RADIOACTIVE SEED IMPLANT/BRACHYTHERAPY IMPLANT/  WITH CYSTOSCOPY;  Surgeon: Janith Lima, MD;  Location[facility];  Service: Urology;  Laterality: N/A;   SPACE OAR INSTILLATION N/A 12/01/2020   Procedure: SPACE OAR INSTILLATION;  Surgeon: Janith Lima, MD;  Location: [facility];  Service: Urology;  Laterality: N/A;    reports that he quit smoking about 19 years ago. His smoking use included cigarettes. He has a 3.00 pack-year smoking history. He has never used smokeless tobacco. He reports previous alcohol use. He reports previous drug use. Drug: Marijuana. family history includes Cancer in his father; Depression in his mother; Diabetes in his mother; Early  death in his father and mother; Heart disease in his mother; Hyperlipidemia in his mother; Hypertension in his mother and sister; Kidney disease in his father; Prostate cancer in his father. No Known Allergies Current Outpatient Medications on File Prior to Visit  Medication Sig Dispense Refill   aspirin 81 MG tablet Take 81 mg by mouth daily.     Multiple Vitamin (MULTIVITAMIN) tablet Take 1 tablet by mouth daily.      tamsulosin (FLOMAX) 0.4 MG CAPS capsule Take 0.4 mg by mouth daily after supper.     traZODone (DESYREL) 50 MG tablet TAKE 1 TABLET (50 MG TOTAL) AT BEDTIME BY MOUTH. 90 tablet 3   atorvastatin (LIPITOR) 40 MG tablet Take 1 tablet (40 mg total) by mouth daily. (Patient not taking: No sig reported) 90 tablet 3   docusate sodium (COLACE) 100 MG capsule Take 1 capsule (100 mg total) by mouth daily as needed for up to 30 doses. (Patient not taking: Reported on 03/07/2021) 30 capsule 0   oxyCODONE-acetaminophen (PERCOCET) 5-325 MG tablet Take 1 tablet by mouth every 4 (four) hours as needed for up to 18 doses for severe pain. (Patient not taking: Reported on 03/07/2021) 18 tablet 0   tiZANidine (ZANAFLEX) 2 MG tablet Take 2 mg by mouth every 6 (six) hours as needed.     [DISCONTINUED] pantoprazole (PROTONIX) 40 MG tablet TAKE 1 TABLET BY MOUTH EVERY DAY (Patient not taking: Reported on 08/29/2019) 90 tablet 1   No current facility-administered medications on file prior to visit.        ROS:  All others reviewed and negative.  Objective        PE:  BP 140/78 (BP Location: Left Arm, Patient Position: Sitting, Cuff Size: Large)   Pulse 75   Temp 97.7 F (36.5 C) (Oral)   Ht 5\' 7"  (1.702 m)   Wt 165 lb (74.8 kg)   SpO2 98%   BMI 25.84 kg/m                 Constitutional: Pt appears in NAD               HENT: Head: NCAT.                Right Ear: External ear normal.                 Left Ear: External ear normal.                Eyes: . Pupils are equal, round, and reactive to  light. Conjunctivae and EOM are normal               Nose: without d/c or deformity               Neck: Neck supple. Gross normal ROM  Cardiovascular: Normal rate and regular rhythm.                 Pulmonary/Chest: Effort normal and breath sounds without rales or wheezing.                Abd:  Soft, NT, ND, + BS, no organomegaly               Neurological: Pt is alert. At baseline orientation, motor grossly intact               Skin: Skin is warm. No rashes, no other new lesions, LE edema - none, dorsalis pedis trace bilateral               Psychiatric: Pt behavior is normal without agitation   Micro: none  Cardiac tracings I have personally interpreted today:  none  Pertinent Radiological findings (summarize): none   Lab Results  Component Value Date   WBC 10.4 11/23/2020   HGB 14.1 11/23/2020   HCT 43.5 11/23/2020   PLT 347 11/23/2020   GLUCOSE 105 (H) 11/23/2020   CHOL 173 05/04/2020   TRIG 71 05/04/2020   HDL 54 05/04/2020   LDLCALC 103 (H) 05/04/2020   ALT 22 11/23/2020   AST 21 11/23/2020   NA 136 11/23/2020   K 4.3 11/23/2020   CL 106 11/23/2020   CREATININE 1.06 11/23/2020   BUN 8 11/23/2020   CO2 24 11/23/2020   TSH 1.08 05/04/2020   PSA 12.2 (H) 05/04/2020   INR 1.0 11/23/2020   HGBA1C 5.9 (H) 05/04/2020   Assessment/Plan:  EDIE DARLEY is a 70 y.o. Black or African American [2] male with  has a past medical history of Anxiety, Coronary artery disease (previouly seen by cardiologist--- dr Marlou Porch, lov in epic 11-08-2016 was released prn and follow up with pcp), Depression, GERD (gastroesophageal reflux disease), History of 2019 novel coronavirus disease (COVID-19) (11/10/2020), HLD (hyperlipidemia), Hypertension, Prostate cancer [facility]) (urologist--- dr gay/  oncologist--- dr Tammi Klippel), and Vitamin D deficiency.  Leg cramps Ok to stop lipitor, cont to follow  Smoking Urged to quit completely, not quite ready  HYPERCHOLESTEROLEMIA Lab Results   Component Value Date   LDLCALC 103 (H) 05/04/2020   Uncontrolled, goal ldl < 70, for crestor 20 qod, low chol diet   Claudication ([employer]) Cant r/o symptomatic PAD - for asa 81 d, statin as above, and LE arterial dopplers, consider vascular surgury referral  Followup: Return in about 4 weeks (around 04/04/2021).  Cathlean Cower, MD 03/07/2021 8:01 PM [facility]

## 2021-03-07 NOTE — Assessment & Plan Note (Signed)
Lab Results  Component Value Date   LDLCALC 103 (H) 05/04/2020   Uncontrolled, goal ldl < 70, for crestor 20 qod, low chol diet

## 2021-03-07 NOTE — Assessment & Plan Note (Signed)
Urged to quit completely, not quite ready

## 2021-03-07 NOTE — Assessment & Plan Note (Signed)
Cant r/o symptomatic PAD - for asa 81 d, statin as above, and LE arterial dopplers, consider vascular surgury referral

## 2021-03-07 NOTE — Patient Instructions (Addendum)
Ok to stop the lipitor as you have  Please take all new medication as prescribed - the generic crestor 20 mg every other day  Please continue all other medications as before, and refills have been done if requested.  Please have the pharmacy call with any other refills you may need.  Please continue your efforts at being more active, low cholesterol diet, and weight control.  Please stop smoking  Please keep your appointments with your specialists as you may have planned  You will be contacted regarding the referral for: Leg circulation testing  Please make an Appointment to return in 1 month

## 2021-03-13 ENCOUNTER — Telehealth: Payer: Self-pay | Admitting: Internal Medicine

## 2021-03-13 NOTE — Telephone Encounter (Signed)
LVM for pt to rtn my call to schedule AWV with [facility]. Please schedule this appt if pt calls the office.  °

## 2021-03-16 ENCOUNTER — Telehealth: Payer: Self-pay | Admitting: Internal Medicine

## 2021-03-16 NOTE — Telephone Encounter (Signed)
LVM for pt to rtn my call to schedule awv with [facility]. Please schedule awv with [facility] if pt calls the office.

## 2021-03-30 ENCOUNTER — Other Ambulatory Visit: Payer: Self-pay | Admitting: Internal Medicine

## 2021-03-30 NOTE — Telephone Encounter (Signed)
Please refill as per office routine med refill policy (all routine meds refilled for 3 mo or monthly per pt preference up to one year from last visit, then month to month grace period for 3 mo, then further med refills will have to be denied)  

## 2021-03-31 ENCOUNTER — Ambulatory Visit (HOSPITAL_COMMUNITY)
Admission: RE | Admit: 2021-03-31 | Discharge: 2021-03-31 | Disposition: A | Discharge status: Home / Self Care | Payer: 59 | Source: Ambulatory Visit | Attending: Cardiology | Admitting: Cardiology

## 2021-03-31 ENCOUNTER — Other Ambulatory Visit (HOSPITAL_COMMUNITY): Payer: Self-pay | Admitting: Internal Medicine

## 2021-03-31 ENCOUNTER — Other Ambulatory Visit: Payer: Self-pay

## 2021-03-31 DIAGNOSIS — I739 Peripheral vascular disease, unspecified: Secondary | ICD-10-CM | POA: Insufficient documentation

## 2021-03-31 DIAGNOSIS — R252 Cramp and spasm: Secondary | ICD-10-CM | POA: Diagnosis not present

## 2021-04-02 ENCOUNTER — Other Ambulatory Visit: Payer: Self-pay | Admitting: Internal Medicine

## 2021-04-02 ENCOUNTER — Encounter: Payer: Self-pay | Admitting: Internal Medicine

## 2021-04-02 DIAGNOSIS — I739 Peripheral vascular disease, unspecified: Secondary | ICD-10-CM

## 2021-04-24 NOTE — Progress Notes (Signed)
VASCULAR [facility] VEIN SPECIALISTS OF [place]  ASSESSMENT / PLAN: [name] Allen is a 70 y.o. 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  Patient counseled patients with asymptomatic peripheral arterial disease or claudication have a 1-2% risk of developing chronic limb threatening ischemia, but a 15-30% risk of mortality in the next 5 years. Intervention should only be considered for medically optimized patients with disabling symptoms. .  Recommend the following which can slow the progression of atherosclerosis and reduce the risk of major adverse cardiac / limb events:  Complete cessation from all tobacco products. Blood glucose control with goal A1c < 7%. Blood pressure control with goal blood pressure < 140/90 mmHg. Lipid reduction therapy with goal LDL-C <100 mg/dL (<70 if symptomatic from PAD).  Aspirin '81mg'$  PO QD.  Atorvastatin 40-'80mg'$  PO QD (or other "high intensity" statin therapy). Adequate hydration (at least 2 liters / day) if patient's heart and kidney function is adequate. Daily walking to and past the point of discomfort. Patient counseled to keep a log of exercise distance.  Follow up with [facility] PA in 6 months with ABI for surveillance.  CHIEF COMPLAINT: leg cramping when walking  HISTORY OF PRESENT ILLNESS: [name] Allen is a 70 y.o. male referred to clinic for evaluation of bilateral calf cramping when walking.  Patient reports symptoms began about 2 to 3 months ago.  Symptoms began after walking for about 100 yards.  Resting seems to relieve the symptoms.  He reports no symptoms typical of ischemic rest pain.  He has no ulcers about the foot which are not healing.  VASCULAR SURGICAL HISTORY: Denies  VASCULAR RISK FACTORS: Negative history of cerebrovascular disease / stroke / transient ischemic attack. Positive history of coronary artery disease. + history of PCI. + history of CABG.  Negative history of diabetes  mellitus. Last A1c 5.9. Positive history of smoking. Not actively smoking. Positive history of hypertension.  Negative history of chronic kidney disease.  Negative history of chronic obstructive pulmonary disease.  AMBULATORY STATUS: Ambulatory within the community without limits  Past Medical History:  Diagnosis Date   Anxiety    Coronary artery disease previouly seen by cardiologist--- dr Marlou Porch, lov in epic 11-08-2016 was released prn and follow up with pcp   cardiac cath 05-18-2004 severe 1V CAD of LAD & nonobstructive cad RCA/ Cfx;  05-19-2004 s/p CABG x1 ,  LIMA to distalLAD //    recurrent chest pain  LHC 03/26/12-- nonobOstial LAD,patent graft, minor irregularities Cfx, PCI/ DES x1 to midRCA 99% EF 55-65%//  nuclear study 11-12-2012  normal w/ no ischemia and normal LVF and wall motion, nuclear ef 58%   Depression    GERD (gastroesophageal reflux disease)    History of 2019 novel coronavirus disease (COVID-19) 11/10/2020   pt tested positive for covid, test done for surgery;  per pt was asymptomatic   HLD (hyperlipidemia)    Hypertension    followed by pcp---  currently treated w/ diet, no medication since 2020   Prostate cancer [facility]) urologist--- dr gay/  oncologist--- dr Tammi Klippel   dx 10/ 2021,  Stage T1c, Gleason 3+4, PSA 7.02   Vitamin D deficiency     Past Surgical History:  Procedure Laterality Date   CARDIAC CATHETERIZATION  05-18-2004  '@[facility]'$    1V CAD involving distal LAD ,  nonobstructive cad involving RCA/ CFx,  ef 45-50%,  anterior and inferior apical hypokinesis   COLONOSCOPY WITH PROPOFOL  last one 11-22-2017  CORONARY ARTERY BYPASS GRAFT  05-19-2004  dr Roxy Manns '@[facility]'$    x1  LIMA to distal LAD   INGUINAL HERNIA REPAIR Right 03-29-2008  '@[facility]'$    LEFT HEART CATHETERIZATION WITH CORONARY/GRAFT ANGIOGRAM  03/26/2012   Procedure: LEFT HEART CATHETERIZATION WITH Beatrix Fetters;  Surgeon: Peter M Martinique, MD;  Location: [facility] CATH LAB;  Service: Cardiovascular;;   PENILE  PROSTHESIS IMPLANT N/A 09/11/2017   Procedure: PENILE PROTHESIS INFLATABLE AMS;  Surgeon: Lucas Mallow, MD;  Location: [facility];  Service: Urology;  Laterality: N/A;   PERCUTANEOUS CORONARY STENT INTERVENTION (PCI-S)  03/26/2012   Procedure: PERCUTANEOUS CORONARY STENT INTERVENTION (PCI-S);  Surgeon: Peter M Martinique, MD;  Location: [facility] CATH LAB;  Service: Cardiovascular;;   RADIOACTIVE SEED IMPLANT N/A 12/01/2020   Procedure: RADIOACTIVE SEED IMPLANT/BRACHYTHERAPY IMPLANT/ WITH CYSTOSCOPY;  Surgeon: Janith Lima, MD;  Location: [facility];  Service: Urology;  Laterality: N/A;   SPACE OAR INSTILLATION N/A 12/01/2020   Procedure: SPACE OAR INSTILLATION;  Surgeon: Janith Lima, MD;  Location: [facility];  Service: Urology;  Laterality: N/A;    Family History  Problem Relation Age of Onset   Heart disease Mother    Depression Mother    Diabetes Mother    Early death Mother    Hypertension Mother    Hyperlipidemia Mother    Kidney disease Father    Early death Father    Cancer Father    Prostate cancer Father    Hypertension Sister    Colon cancer Neg Hx    Esophageal cancer Neg Hx    Ovarian cancer Neg Hx    Pancreatic cancer Neg Hx    Rectal cancer Neg Hx    Stomach cancer Neg Hx     Social History   Socioeconomic History   Marital status: Single    Spouse name: Not on file   Number of children: Not on file   Years of education: Not on file   Highest education level: Not on file  Occupational History    Comment: retired  Tobacco Use   Smoking status: Former    Packs/day: 0.30    Years: 10.00    Pack years: 3.00    Types: Cigarettes    Quit date: 06/30/2001    Years since quitting: 19.8   Smokeless tobacco: Never  Vaping Use   Vaping Use: Former   Quit date: 11/09/2015  Substance and Sexual Activity   Alcohol use: Not Currently    Comment: occasional   Drug use: Not Currently    Types: Marijuana    Comment: yrs  ago   Sexual activity: Not on file  Other Topics Concern   Not on file  Social History Narrative   Not on file   Social Determinants of Health   Financial Resource Strain: Not on file  Food Insecurity: Not on file  Transportation Needs: Not on file  Physical Activity: Not on file  Stress: Not on file  Social Connections: Not on file  Intimate Partner Violence: Not on file    No Known Allergies  Current Outpatient Medications  Medication Sig Dispense Refill   aspirin 81 MG tablet Take 81 mg by mouth daily.     atorvastatin (LIPITOR) 40 MG tablet Take 1 tablet (40 mg total) by mouth daily. (Patient not taking: No sig reported) 90 tablet 3   docusate sodium (COLACE) 100 MG capsule Take 1 capsule (100 mg total) by mouth daily as needed for  up to 30 doses. (Patient not taking: Reported on 03/07/2021) 30 capsule 0   Multiple Vitamin (MULTIVITAMIN) tablet Take 1 tablet by mouth daily.      oxyCODONE-acetaminophen (PERCOCET) 5-325 MG tablet Take 1 tablet by mouth every 4 (four) hours as needed for up to 18 doses for severe pain. (Patient not taking: Reported on 03/07/2021) 18 tablet 0   rosuvastatin (CRESTOR) 20 MG tablet Take 1 tablet (20 mg total) by mouth every other day. 45 tablet 3   tamsulosin (FLOMAX) 0.4 MG CAPS capsule Take 0.4 mg by mouth daily after supper.     tiZANidine (ZANAFLEX) 2 MG tablet Take 2 mg by mouth every 6 (six) hours as needed.     traZODone (DESYREL) 50 MG tablet TAKE 1 TABLET (50 MG TOTAL) AT BEDTIME BY MOUTH. 90 tablet 3   No current facility-administered medications for this visit.    REVIEW OF SYSTEMS:  '[X]'$  denotes positive finding, '[ ]'$  denotes negative finding Cardiac  Comments:  Chest pain or chest pressure:    Shortness of breath upon exertion:    Short of breath when lying flat:    Irregular heart rhythm:        Vascular    Pain in calf, thigh, or hip brought on by ambulation: x   Pain in feet at night that wakes you up from your sleep:      Blood clot in your veins:    Leg swelling:         Pulmonary    Oxygen at home:    Productive cough:     Wheezing:         Neurologic    Sudden weakness in arms or legs:     Sudden numbness in arms or legs:     Sudden onset of difficulty speaking or slurred speech:    Temporary loss of vision in one eye:     Problems with dizziness:         Gastrointestinal    Blood in stool:     Vomited blood:         Genitourinary    Burning when urinating:     Blood in urine:        Psychiatric    Major depression:         Hematologic    Bleeding problems:    Problems with blood clotting too easily:        Skin    Rashes or ulcers:        Constitutional    Fever or chills:      PHYSICAL EXAM Vitals:   04/25/21 0823  BP: 115/75  Pulse: 69  Resp: 20  Temp: 98.7 F (37.1 C)  SpO2: 98%  Weight: 163 lb (73.9 kg)  Height: '5\' 7"'$  (1.702 m)    Constitutional: well appearing. no distress. Appears well nourished.  Neurologic: CN intact. no focal findings. no sensory loss. Psychiatric:  Mood and affect symmetric and appropriate. Eyes:  No icterus. No conjunctival pallor. Ears, nose, throat:  mucous membranes moist. Midline trachea.  Cardiac: regular rate and rhythm.  Respiratory:  unlabored. Abdominal:  soft, non-distended.  Peripheral vascular:  1+ femoral pulses bilaterally No palpable popliteal pulses bilaterally No palpable pedal pulses bilaterally  Feet are warm Extremity: no edema. no cyanosis. no pallor.  Skin: no gangrene. no ulceration.  Lymphatic: no Stemmer's sign. no palpable lymphadenopathy.  PERTINENT LABORATORY AND RADIOLOGIC DATA  Most recent CBC CBC Latest Ref Rng & Units 11/23/2020  11/10/2020 05/04/2020  WBC 4.0 - 10.5 K/uL 10.4 9.1 11.0(H)  Hemoglobin 13.0 - 17.0 g/dL 14.1 13.6 14.4  Hematocrit 39.0 - 52.0 % 43.5 42.1 43.9  Platelets 150 - 400 K/uL 347 355 330     Most recent CMP CMP Latest Ref Rng & Units 11/23/2020 11/10/2020 05/04/2020  Glucose 70  - 99 mg/dL 105(H) 94 -  BUN 8 - 23 mg/dL 8 10 -  Creatinine 0.61 - 1.24 mg/dL 1.06 0.88 -  Sodium 135 - 145 mmol/L 136 137 -  Potassium 3.5 - 5.1 mmol/L 4.3 3.7 -  Chloride 98 - 111 mmol/L 106 107 -  CO2 22 - 32 mmol/L 24 23 -  Calcium 8.9 - 10.3 mg/dL 9.1 9.0 -  Total Protein 6.5 - 8.1 g/dL 7.2 7.1 7.3  Total Bilirubin 0.3 - 1.2 mg/dL 0.5 0.6 0.6  Alkaline Phos 38 - 126 U/L 94 90 -  AST 15 - 41 U/L '21 21 23  '$ ALT 0 - 44 U/L '22 17 24    '$ Renal function CrCl cannot be calculated (Patient's most recent lab result is older than the maximum 21 days allowed.).  Hgb A1c MFr Bld (% of total Hgb)  Date Value  05/04/2020 5.9 (H)    LDL Cholesterol (Calc)  Date Value Ref Range Status  05/04/2020 103 (H) mg/dL (calc) Final    Comment:    Reference range: <100 . Desirable range <100 mg/dL for primary prevention;   <70 mg/dL for patients with CHD or diabetic patients  with > or = 2 CHD risk factors. [name] LDL-C is now calculated using the Martin-Hopkins  calculation, which is a validated novel method providing  better accuracy than the Friedewald equation in the  estimation of LDL-C.  Cresenciano Genre et al. Annamaria Helling. [id number]): 2061-2068  (http://education.[employer].com/faq/FAQ164)     LOWER EXTREMITY DOPPLER STUDY   Patient Name:  [name] Allen  Date of Exam:   03/31/2021  Medical Rec #: [id number]     Accession #:    [id number]  Date of Birth: 1951-08-05     Patient Gender: M  Patient Age:   70Y  Exam Location:  [facility]  Procedure:      VAS [place] ABI WITH/WO TBI  Referring Phys: [place]    ---------------------------------------------------------------------------  -----     Indications: Claudication, and Patient states that he has bilateral leg  pain               left greater than right after walking 50 to 70 yards, pain  starts               high and radiates down leg. Patient denies rest pain.   High Risk Factors: Hypertension, hyperlipidemia, past history of  smoking,                     coronary artery disease.     Comparison Study: Prior ABI done 11/09/2015, right 1.03 left 1.02   Performing Technologist: Leavy Cella RDCS   Supporting Technologist: Sharlett Iles RVT      Examination Guidelines: A complete evaluation includes at minimum, Doppler  waveform signals and systolic blood pressure reading at the level of  bilateral  brachial, anterior tibial, and posterior tibial arteries, when vessel  segments  are accessible. Bilateral testing is considered an integral part of a  complete  examination. Photoelectric Plethysmograph (PPG) waveforms and toe systolic  pressure readings are included as required and additional duplex testing  as  needed. Limited examinations for reoccurring indications may be performed  as  noted.      ABI Findings:  +--------+------------------+-----+--------+--------+  Right   Rt Pressure (mmHg)IndexWaveformComment   +--------+------------------+-----+--------+--------+  [id number]                                      +--------+------------------+-----+--------+--------+  ATA     131               1.03 biphasic          +--------+------------------+-----+--------+--------+  PTA     125               0.98 biphasic          +--------+------------------+-----+--------+--------+  PERO    123               0.97 biphasic          +--------+------------------+-----+--------+--------+   +--------+------------------+-----+----------+-------+  Left    Lt Pressure (mmHg)IndexWaveform  Comment  +--------+------------------+-----+----------+-------+  [id number]                                       +--------+------------------+-----+----------+-------+  ATA     80                0.63 monophasic         +--------+------------------+-----+----------+-------+  PTA     82                0.65 monophasic          +--------+------------------+-----+----------+-------+  PERO                           absent             +--------+------------------+-----+----------+-------+   +-------+-----------+-----------+------------+------------+  ABI/TBIToday's ABIToday's TBIPrevious ABIPrevious TBI  +-------+-----------+-----------+------------+------------+  Right  1.03                  1.03        .66           +-------+-----------+-----------+------------+------------+  Left   .65                   1.02        .71           +-------+-----------+-----------+------------+------------+          Left ABIs appear decreased compared to prior study on 2.15.2017. Right  ABIs appear essentially unchanged compared to prior study on 2.15.2017.     Findings reported to [facility] email through epic. at 11:20am..  Summary:  Right:  Although ankle brachial indices are within normal limits (0.95-1.29),  arterial Doppler waveforms at the ankle suggest some component of arterial  occlusive disease.  Left: Resting left ankle-brachial index indicates moderate left lower  extremity arterial disease.       *See table(s) above for measurements and observations.     Referring provider notified that bilateral leg arterial duplex is  suggested.   Electronically signed by Ida Rogue MD on 04/01/2021 at 10:28:30 PM.   Yevonne Aline. Stanford Breed, MD [facility] and Vein Specialists of [facility] Phone Number: [phone] 04/24/2021 11:48 AM

## 2021-04-25 ENCOUNTER — Other Ambulatory Visit: Payer: Self-pay

## 2021-04-25 ENCOUNTER — Encounter: Payer: Self-pay | Admitting: Vascular Surgery

## 2021-04-25 ENCOUNTER — Ambulatory Visit (INDEPENDENT_AMBULATORY_CARE_PROVIDER_SITE_OTHER): Payer: 59 | Admitting: Vascular Surgery

## 2021-04-25 VITALS — BP 115/75 | HR 69 | Temp 98.7°F | Resp 20 | Ht 67.0 in | Wt 163.0 lb

## 2021-04-25 DIAGNOSIS — I70212 Atherosclerosis of native arteries of extremities with intermittent claudication, left leg: Secondary | ICD-10-CM | POA: Diagnosis not present

## 2021-04-26 ENCOUNTER — Other Ambulatory Visit: Payer: Self-pay

## 2021-04-26 DIAGNOSIS — I70212 Atherosclerosis of native arteries of extremities with intermittent claudication, left leg: Secondary | ICD-10-CM

## 2021-05-05 ENCOUNTER — Ambulatory Visit (INDEPENDENT_AMBULATORY_CARE_PROVIDER_SITE_OTHER): Payer: 59 | Admitting: Cardiovascular Disease

## 2021-05-05 ENCOUNTER — Other Ambulatory Visit: Payer: Self-pay

## 2021-05-05 ENCOUNTER — Encounter: Payer: Self-pay | Admitting: Cardiovascular Disease

## 2021-05-05 DIAGNOSIS — E7800 Pure hypercholesterolemia, unspecified: Secondary | ICD-10-CM | POA: Diagnosis not present

## 2021-05-05 DIAGNOSIS — I739 Peripheral vascular disease, unspecified: Secondary | ICD-10-CM

## 2021-05-05 DIAGNOSIS — I10 Essential (primary) hypertension: Secondary | ICD-10-CM

## 2021-05-05 DIAGNOSIS — I2583 Coronary atherosclerosis due to lipid rich plaque: Secondary | ICD-10-CM

## 2021-05-05 DIAGNOSIS — I2510 Atherosclerotic heart disease of native coronary artery without angina pectoris: Secondary | ICD-10-CM | POA: Diagnosis not present

## 2021-05-05 NOTE — Progress Notes (Signed)
05/05/2021 [name]   Jul 16, 1951  [id number]  Primary Physician Biagio Borg, MD Primary Cardiologist: Lorretta Harp MD Lupe Carney, [place]  HPI:  [name] Allen is a 70 y.o. 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  He does have a history history of hyperlipidemia.  He is never had a heart attack or stroke.  He had coronary artery bypass grafting x1 with LIMA to his LAD in 2005 for ostial LAD disease.  He underwent cardiac catheterization by Dr. Martinique 03/26/2012 revealing a nonobstructive LAD ostium with a patent LIMA to his LAD.  Circumflex was free of disease.  His right coronary artery had a high-grade mid lesion which was stented.  His EF was normal.  He has been asymptomatic since.   Current Meds  Medication Sig   aspirin 81 MG tablet Take 81 mg by mouth daily.   Multiple Vitamin (MULTIVITAMIN) tablet Take 1 tablet by mouth daily.    oxyCODONE-acetaminophen (PERCOCET) 5-325 MG tablet Take 1 tablet by mouth every 4 (four) hours as needed for up to 18 doses for severe pain.   rosuvastatin (CRESTOR) 20 MG tablet Take 1 tablet (20 mg total) by mouth every other day.   tamsulosin (FLOMAX) 0.4 MG CAPS capsule Take 0.4 mg by mouth daily after supper.   tiZANidine (ZANAFLEX) 2 MG tablet Take 2 mg by mouth every 6 (six) hours as needed.   traZODone (DESYREL) 50 MG tablet TAKE 1 TABLET (50 MG TOTAL) AT BEDTIME BY MOUTH.     No Known Allergies  Social History   Socioeconomic History   Marital status: Single    Spouse name: Not on file   Number of children: Not on file   Years of education: Not on file   Highest education level: Not on file  Occupational History    Comment: retired  Tobacco Use   Smoking status: Former    Packs/day: 0.30    Years: 10.00    Pack years: 3.00    Types: Cigarettes     Quit date: 06/30/2001    Years since quitting: 19.8   Smokeless tobacco: Never  Vaping Use   Vaping Use: Former   Quit date: 11/09/2015  Substance and Sexual Activity   Alcohol use: Not Currently    Comment: occasional   Drug use: Not Currently    Types: Marijuana    Comment: yrs ago   Sexual activity: Not on file  Other Topics Concern   Not on file  Social History Narrative   Not on file   Social Determinants of Health   Financial Resource Strain: Not on file  Food Insecurity: Not on file  Transportation Needs: Not on file  Physical Activity: Not on file  Stress: Not on file  Social Connections: Not on file  Intimate Partner Violence: Not on file     Review of Systems: General: negative for chills, fever, night sweats or weight changes.  Cardiovascular: negative for chest pain, dyspnea on exertion, edema, orthopnea, palpitations, paroxysmal nocturnal dyspnea or shortness of breath Dermatological: negative for rash Respiratory: negative for cough or wheezing Urologic: negative for hematuria Abdominal: negative for nausea, vomiting, diarrhea, bright red blood per rectum, melena, or hematemesis Neurologic: negative for visual changes, syncope, or dizziness All other systems reviewed and are  otherwise negative except as noted above.    Blood pressure (!) 148/78, pulse 70, height '5\' 7"'$  (1.702 m), weight 163 lb 3.2 oz (74 kg), SpO2 98 %.  General appearance: alert and no distress Neck: no adenopathy, no carotid bruit, no JVD, supple, symmetrical, trachea midline, and thyroid not enlarged, symmetric, no tenderness/mass/nodules Lungs: clear to auscultation bilaterally Heart: regular rate and rhythm, S1, S2 normal, no murmur, click, rub or gallop Extremities: extremities normal, atraumatic, no cyanosis or edema Pulses: 2+ and symmetric Skin: Skin color, texture, turgor normal. No rashes or lesions Neurologic: Grossly normal  EKG sinus rhythm at 70 without ST or T wave  changes.  I personally reviewed this EKG.  ASSESSMENT AND PLAN:   HYPERCHOLESTEROLEMIA History of hyperlipidemia on statin therapy with lipid profile performed 05/04/2020 revealing total cholesterol 173, LDL 103 and HDL 54.  I am going to recheck a lipid liver profile.  Coronary artery disease History of CABG x1 with a LIMA to his LAD in 2005 for ostial LAD disease.  He underwent cardiac catheterization by Dr. Martinique 03/26/2012 revealing a patent LIMA with nonobstructed ostial LAD disease, high-grade focal mid RCA stenosis for which she had PCI drug-eluting stenting with a 2.5 x 12 mm long stent.  He has had no further symptoms.  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  I am not to get a 2D echo and a Lexiscan Myoview to risk stratify him.  Hypertension History of essential hypertension a blood pressure measured today at 148/78.  He is not on antihypertensive medications.  Claudication [facility]) Recent Doppler showed normal right ABI with a left ABI of 0.65 seen by Dr. Stanford Breed .     Lorretta Harp MD FACP,FACC,FAHA, [facility] 05/05/2021 11:52 AM

## 2021-05-05 NOTE — Assessment & Plan Note (Signed)
History of essential hypertension a blood pressure measured today at 148/78.  He is not on antihypertensive medications.

## 2021-05-05 NOTE — Assessment & Plan Note (Signed)
Recent Doppler showed normal right ABI with a left ABI of 0.65 seen by Dr. Stanford Breed .

## 2021-05-05 NOTE — Assessment & Plan Note (Signed)
History of hyperlipidemia on statin therapy with lipid profile performed 05/04/2020 revealing total cholesterol 173, LDL 103 and HDL 54.  I am going to recheck a lipid liver profile.

## 2021-05-05 NOTE — Assessment & Plan Note (Signed)
History of CABG x1 with a LIMA to his LAD in 2005 for ostial LAD disease.  He underwent cardiac catheterization by Dr. Martinique 03/26/2012 revealing a patent LIMA with nonobstructed ostial LAD disease, high-grade focal mid RCA stenosis for which she had PCI drug-eluting stenting with a 2.5 x 12 mm long stent.  He has had no further symptoms.  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  I am not to get a 2D echo and a Lexiscan Myoview to risk stratify him.

## 2021-05-05 NOTE — Patient Instructions (Signed)
Medication Instructions:  Your physician recommends that you continue on your current medications as directed. Please refer to the Current Medication list given to you today.  *If you need a refill on your cardiac medications before your next appointment, please call your pharmacy*   Lab Work: Lipid, hepatic today  If you have labs (blood work) drawn today and your tests are completely normal, you will receive your results only by: [place] (if you have MyChart) OR A paper copy in the mail If you have any lab test that is abnormal or we need to change your treatment, we will call you to review the results.   Testing/Procedures: Your physician has requested that you have a lexiscan myoview. For further information please visit [email or web address]. Please follow instruction sheet, as given. This will take place at [place], [address]  How to prepare for your Myocardial Perfusion Test: Do not eat or drink 3 hours prior to your test, except you may have water. Do not consume products containing caffeine (regular or decaffeinated) 12 hours prior to your test. (ex: coffee, chocolate, sodas, tea). Do bring a list of your current medications with you.  If not listed below, you may take your medications as normal. Do wear comfortable clothes (no dresses or overalls) and walking shoes, tennis shoes preferred (No heels or open toe shoes are allowed). Do NOT wear cologne, perfume, aftershave, or lotions (deodorant is allowed). The test will take approximately 3 to 4 hours to complete If these instructions are not followed, your test will have to be rescheduled.  Your physician has requested that you have an echocardiogram. Echocardiography is a painless test that uses sound waves to create images of your heart. It provides your doctor with information about the size and shape of your heart and how well your heart's chambers and valves are working. This procedure takes approximately  one hour. There are no restrictions for this procedure.   Follow-Up: At [facility], you and your health needs are our priority.  As part of our continuing mission to provide you with exceptional heart care, we have created designated Provider Care Teams.  These Care Teams include your primary Cardiologist (physician) and Advanced Practice Providers (APPs -  Physician Assistants and Nurse Practitioners) who all work together to provide you with the care you need, when you need it.  We recommend signing up for the patient portal called "MyChart".  Sign up information is provided on this After Visit Summary.  MyChart is used to connect with patients for Virtual Visits (Telemedicine).  Patients are able to view lab/test results, encounter notes, upcoming appointments, etc.  Non-urgent messages can be sent to your provider as well.   To learn more about what you can do with MyChart, go to [email or web address].    Your next appointment:   12 month(s)  The format for your next appointment:   In Person  Provider:   Quay Burow, MD

## 2021-05-10 ENCOUNTER — Ambulatory Visit: Payer: 59 | Admitting: Internal Medicine

## 2021-05-10 ENCOUNTER — Telehealth (HOSPITAL_COMMUNITY): Payer: Self-pay | Admitting: *Deleted

## 2021-05-10 NOTE — Telephone Encounter (Signed)
Close encounter 

## 2021-05-11 ENCOUNTER — Other Ambulatory Visit: Payer: Self-pay

## 2021-05-11 ENCOUNTER — Ambulatory Visit (HOSPITAL_COMMUNITY)
Admission: RE | Admit: 2021-05-11 | Discharge: 2021-05-11 | Disposition: A | Discharge status: Home / Self Care | Payer: 59 | Source: Ambulatory Visit | Attending: Cardiology | Admitting: Cardiology

## 2021-05-11 DIAGNOSIS — I2510 Atherosclerotic heart disease of native coronary artery without angina pectoris: Secondary | ICD-10-CM

## 2021-05-11 DIAGNOSIS — I2583 Coronary atherosclerosis due to lipid rich plaque: Secondary | ICD-10-CM | POA: Diagnosis present

## 2021-05-11 LAB — MYOCARDIAL PERFUSION IMAGING
LV dias vol: 116 mL (ref 62–150)
LV sys vol: 54 mL
Peak HR: 99 {beats}/min
Rest HR: 50 {beats}/min
SDS: 0
SRS: 4
SSS: 4
TID: 1.04

## 2021-05-11 MED ORDER — TECHNETIUM TC 99M TETROFOSMIN IV KIT
10.9000 | PACK | Freq: Once | INTRAVENOUS | Status: AC | PRN
  Administered 2021-05-11: 10.9 via INTRAVENOUS
  Filled 2021-05-11: qty 11

## 2021-05-11 MED ORDER — TECHNETIUM TC 99M TETROFOSMIN IV KIT
31.4000 | PACK | Freq: Once | INTRAVENOUS | Status: AC | PRN
  Administered 2021-05-11: 31.4 via INTRAVENOUS
  Filled 2021-05-11: qty 32

## 2021-05-11 MED ORDER — REGADENOSON 0.4 MG/5ML IV SOLN
0.4000 mg | Freq: Once | INTRAVENOUS | Status: AC
  Administered 2021-05-11: 0.4 mg via INTRAVENOUS

## 2021-05-12 ENCOUNTER — Ambulatory Visit: Payer: 59 | Admitting: Internal Medicine

## 2021-05-12 LAB — LIPID PANEL
Chol/HDL Ratio: 4.4 ratio (ref 0.0–5.0)
Cholesterol, Total: 168 mg/dL (ref 100–199)
HDL: 38 mg/dL — ABNORMAL LOW (ref >39)
LDL Chol Calc (NIH): 112 mg/dL — ABNORMAL HIGH (ref 0–99)
Triglycerides: 94 mg/dL (ref 0–149)
VLDL Cholesterol Cal: 18 mg/dL (ref 5–40)

## 2021-05-12 LAB — HEPATIC FUNCTION PANEL
ALT: 13 IU/L (ref 0–44)
AST: 17 IU/L (ref 0–40)
Albumin: 4.3 g/dL (ref 3.7–4.7)
Alkaline Phosphatase: 117 IU/L (ref 44–121)
Bilirubin Total: 0.2 mg/dL (ref 0.0–1.2)
Bilirubin, Direct: <0.1 mg/dL (ref 0.00–0.40)
Total Protein: 7.2 g/dL (ref 6.0–8.5)

## 2021-05-18 ENCOUNTER — Telehealth: Payer: Self-pay | Admitting: *Deleted

## 2021-05-18 DIAGNOSIS — E7800 Pure hypercholesterolemia, unspecified: Secondary | ICD-10-CM

## 2021-05-18 MED ORDER — ROSUVASTATIN CALCIUM 40 MG PO TABS: 40.0000 mg | ORAL_TABLET | ORAL | 3 refills | Status: DC

## 2021-05-18 NOTE — Telephone Encounter (Signed)
pt aware of results  New script sent to the pharmacy  Lab orders mailed to the pt  

## 2021-05-18 NOTE — Telephone Encounter (Signed)
-----   Message from Lorretta Harp, MD sent at 05/13/2021  3:19 PM EDT ----- LDL 112, not at goal on Crestor 20 mg/day. Increase to 40 mg/day and recheck 3 months. LDL target <70

## 2021-05-25 ENCOUNTER — Ambulatory Visit (HOSPITAL_COMMUNITY): Payer: 59 | Attending: Cardiology

## 2021-05-25 ENCOUNTER — Other Ambulatory Visit: Payer: Self-pay

## 2021-05-25 DIAGNOSIS — I2583 Coronary atherosclerosis due to lipid rich plaque: Secondary | ICD-10-CM | POA: Diagnosis not present

## 2021-05-25 DIAGNOSIS — I2510 Atherosclerotic heart disease of native coronary artery without angina pectoris: Secondary | ICD-10-CM | POA: Insufficient documentation

## 2021-05-25 LAB — ECHOCARDIOGRAM COMPLETE
Area-P 1/2: 3.91 cm2
S' Lateral: 2.8 cm

## 2021-05-30 ENCOUNTER — Telehealth: Payer: Self-pay | Admitting: Cardiovascular Disease

## 2021-05-30 NOTE — Telephone Encounter (Signed)
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 Nurse informed pt that she would check and contact him once completed.  Pt verbalized understanding.

## 2021-05-30 NOTE — Telephone Encounter (Signed)
Patient is calling about getting cleared for DOT. Please call back

## 2021-05-31 NOTE — Telephone Encounter (Signed)
   Routing message, pt calling back

## 2021-05-31 NOTE — Telephone Encounter (Signed)
Called patient, advised that we were unable to locate forms- but he would bring them back to the office and would call us when he dropped them off. Advised to have them placed in Dr.Berry's box.   Thanks!

## 2021-05-31 NOTE — Telephone Encounter (Signed)
Pt calling to check status of DOT forms...[name] please advise.

## 2021-06-01 NOTE — Telephone Encounter (Signed)
Returned call to patient, made patient aware that forms were received at the office but Dr. Gwenlyn Found is out of office today. Advised patient that I would forward message to the nurse that is working with Dr. Gwenlyn Found tomorrow and the forms can be filled out tomorrow while he was in office. Patient verbalized understanding.

## 2021-06-01 NOTE — Telephone Encounter (Signed)
pt was to drop off DOT forms and have them placed in Dr. Rachel Bo box yesterday... pt has done so as of yesterday... was there anything else that he needs to do? [name] on the line wanting to know next steps

## 2021-06-12 NOTE — Telephone Encounter (Signed)
Letter provided to pt on 9/9

## 2021-06-21 ENCOUNTER — Telehealth: Payer: Self-pay | Admitting: Internal Medicine

## 2021-06-21 MED ORDER — TRAZODONE HCL 50 MG PO TABS: 50.0000 mg | ORAL_TABLET | Freq: Every day | ORAL | 1 refills | Status: DC

## 2021-06-21 NOTE — Telephone Encounter (Signed)
1.Medication Requested: traZODone (DESYREL) 50 MG tablet   2. Pharmacy (Name, [place], [place]): [id number] - [place], [place]  Phone:  [phone] Fax:  [phone]   3. On Med List: yes  4. Last Visit with PCP: 06.14.22  5. Next visit date with PCP: n/a   Agent: Please be advised that RX refills may take up to 3 business days. We ask that you follow-up with your pharmacy.

## 2021-12-09 ENCOUNTER — Other Ambulatory Visit: Payer: Self-pay | Admitting: Internal Medicine

## 2021-12-25 ENCOUNTER — Ambulatory Visit (INDEPENDENT_AMBULATORY_CARE_PROVIDER_SITE_OTHER): Payer: 59

## 2021-12-25 DIAGNOSIS — Z1211 Encounter for screening for malignant neoplasm of colon: Secondary | ICD-10-CM | POA: Diagnosis not present

## 2021-12-25 DIAGNOSIS — Z0000 Encounter for general adult medical examination without abnormal findings: Secondary | ICD-10-CM

## 2021-12-25 NOTE — Progress Notes (Signed)
? ?Subjective:  ? [name] Allen is a 71 y.o. male who presents for an Subsequent  Medicare Annual Wellness Visit. ? ?I connected with [name] Allen today by telephone and verified that I am speaking with the correct person using two identifiers. ?Location patient: home ?Location provider: work ?Persons participating in the virtual visit: patient, provider. ?  ?I discussed the limitations, risks, security and privacy concerns of performing an evaluation and management service by telephone and the availability of in person appointments. I also discussed with the patient that there may be a patient responsible charge related to this service. The patient expressed understanding and verbally consented to this telephonic visit.  ?  ?Interactive audio and video telecommunications were attempted between this provider and patient, however failed, due to patient having technical difficulties OR patient did not have access to video capability.  We continued and completed visit with audio only. ? ?  ?Review of Systems    ? ?Cardiac Risk Factors include: advanced age (>87mn, >>32women);male gender ? ?   ?Objective:  ?  ?Today's Vitals  ? ?There is no height or weight on file to calculate BMI. ? ? ?  12/25/2021  ? 11:20 AM 12/26/2020  ?  9:28 AM 12/01/2020  ? 10:03 AM 08/26/2020  ?  1:38 PM 08/29/2019  ? 10:32 PM 09/11/2017  ?  6:50 AM 09/06/2017  ?  6:23 PM  ?Advanced Directives  ?Does Patient Have a Medical Advance Directive? No No No No No No No  ?Would patient like information on creating a medical advance directive? No - Patient declined  No - Patient declined No - Patient declined  Yes (MAU/Ambulatory/Procedural Areas - Information given) Yes (MAU/Ambulatory/Procedural Areas - Information given)  ? ? ?Current Medications (verified) ?Outpatient Encounter Medications as of 12/25/2021  ?Medication Sig  ? aspirin 81 MG tablet Take 81 mg by mouth daily.  ? atorvastatin (LIPITOR) 40 MG tablet Take 1 tablet (40 mg total) by mouth daily.  ?  docusate sodium (COLACE) 100 MG capsule Take 1 capsule (100 mg total) by mouth daily as needed for up to 30 doses.  ? Multiple Vitamin (MULTIVITAMIN) tablet Take 1 tablet by mouth daily.   ? oxyCODONE-acetaminophen (PERCOCET) 5-325 MG tablet Take 1 tablet by mouth every 4 (four) hours as needed for up to 18 doses for severe pain.  ? rosuvastatin (CRESTOR) 40 MG tablet Take 1 tablet (40 mg total) by mouth every other day.  ? tamsulosin (FLOMAX) 0.4 MG CAPS capsule Take 0.4 mg by mouth daily after supper.  ? tiZANidine (ZANAFLEX) 2 MG tablet Take 2 mg by mouth every 6 (six) hours as needed.  ? traZODone (DESYREL) 50 MG tablet TAKE 1 TABLET BY MOUTH EVERYDAY AT BEDTIME  ? [DISCONTINUED] pantoprazole (PROTONIX) 40 MG tablet TAKE 1 TABLET BY MOUTH EVERY DAY (Patient not taking: Reported on 08/29/2019)  ? ?No facility-administered encounter medications on file as of 12/25/2021.  ? ? ?Allergies (verified) ?Patient has no known allergies.  ? ?History: ?Past Medical History:  ?Diagnosis Date  ? Anxiety   ? Coronary artery disease previouly seen by cardiologist--- dr sMarlou Porch lov in epic 11-08-2016 was released prn and follow up with pcp  ? cardiac cath 05-18-2004 severe 1V CAD of LAD & nonobstructive cad RCA/ Cfx;  05-19-2004 s/p CABG x1 ,  LIMA to distalLAD //    recurrent chest pain  LHC 03/26/12-- nonobOstial LAD,patent graft, minor irregularities Cfx, PCI/ DES x1 to midRCA 99% EF 55-65%//  nuclear study  11-12-2012  normal w/ no ischemia and normal LVF and wall motion, nuclear ef 58%  ? Depression   ? GERD (gastroesophageal reflux disease)   ? History of 2019 novel coronavirus disease (COVID-19) 11/10/2020  ? pt tested positive for covid, test done for surgery;  per pt was asymptomatic  ? HLD (hyperlipidemia)   ? Hypertension   ? followed by pcp---  currently treated w/ diet, no medication since 2020  ? Prostate cancer [facility]) urologist--- dr gay/  oncologist--- dr Tammi Klippel  ? dx 10/ 2021,  Stage T1c, Gleason 3+4, PSA 7.02  ?  Vitamin D deficiency   ? ?Past Surgical History:  ?Procedure Laterality Date  ? CARDIAC CATHETERIZATION  05-18-2004  '@[facility]   ? 1V CAD involving distal LAD ,  nonobstructive cad involving RCA/ CFx,  ef 45-50%,  anterior and inferior apical hypokinesis  ? COLONOSCOPY WITH PROPOFOL  last one 11-22-2017  ? CORONARY ARTERY BYPASS GRAFT  05-19-2004  dr Roxy Manns '@[facility]'$   ? x1  LIMA to distal LAD  ? INGUINAL HERNIA REPAIR Right 03-29-2008  '@[facility]'$   ? LEFT HEART CATHETERIZATION WITH CORONARY/GRAFT ANGIOGRAM  03/26/2012  ? Procedure: LEFT HEART CATHETERIZATION WITH Beatrix Fetters;  Surgeon: Peter M Martinique, MD;  Location: [facility] CATH LAB;  Service: Cardiovascular;;  ? PENILE PROSTHESIS IMPLANT N/A 09/11/2017  ? Procedure: PENILE PROTHESIS INFLATABLE AMS;  Surgeon: Lucas Mallow, MD;  Location: [facility];  Service: Urology;  Laterality: N/A;  ? PERCUTANEOUS CORONARY STENT INTERVENTION (PCI-S)  03/26/2012  ? Procedure: PERCUTANEOUS CORONARY STENT INTERVENTION (PCI-S);  Surgeon: Peter M Martinique, MD;  Location: [facility] CATH LAB;  Service: Cardiovascular;;  ? RADIOACTIVE SEED IMPLANT N/A 12/01/2020  ? Procedure: RADIOACTIVE SEED IMPLANT/BRACHYTHERAPY IMPLANT/ WITH CYSTOSCOPY;  Surgeon: Janith Lima, MD;  Location: [facility];  Service: Urology;  Laterality: N/A;  ? SPACE OAR INSTILLATION N/A 12/01/2020  ? Procedure: SPACE OAR INSTILLATION;  Surgeon: Janith Lima, MD;  Location: [facility];  Service: Urology;  Laterality: N/A;  ? ?Family History  ?Problem Relation Age of Onset  ? Heart disease Mother   ? Depression Mother   ? Diabetes Mother   ? Early death Mother   ? Hypertension Mother   ? Hyperlipidemia Mother   ? Kidney disease Father   ? Early death Father   ? Cancer Father   ? Prostate cancer Father   ? Hypertension Sister   ? Colon cancer Neg Hx   ? Esophageal cancer Neg Hx   ? Ovarian cancer Neg Hx   ? Pancreatic cancer Neg Hx   ? Rectal cancer Neg Hx   ? Stomach cancer Neg Hx    ? ?Social History  ? ?Socioeconomic History  ? Marital status: Single  ?  Spouse name: Not on file  ? Number of children: Not on file  ? Years of education: Not on file  ? Highest education level: Not on file  ?Occupational History  ?  Comment: retired  ?Tobacco Use  ? Smoking status: Former  ?  Packs/day: 0.30  ?  Years: 10.00  ?  Pack years: 3.00  ?  Types: Cigarettes  ?  Quit date: 06/30/2001  ?  Years since quitting: 20.5  ? Smokeless tobacco: Never  ?Vaping Use  ? Vaping Use: Former  ? Quit date: 11/09/2015  ?Substance and Sexual Activity  ? Alcohol use: Not Currently  ?  Comment: occasional  ? Drug use: Not Currently  ?  Types: Marijuana  ?  Comment: yrs ago  ? Sexual activity: Not on file  ?Other Topics Concern  ? Not on file  ?Social History Narrative  ? Not on file  ? ?Social Determinants of Health  ? ?Financial Resource Strain: Low Risk   ? Difficulty of Paying Living Expenses: Not hard at all  ?Food Insecurity: No Food Insecurity  ? Worried About Charity fundraiser in the Last Year: Never true  ? Ran Out of Food in the Last Year: Never true  ?Transportation Needs: No Transportation Needs  ? Lack of Transportation (Medical): No  ? Lack of Transportation (Non-Medical): No  ?Physical Activity: Insufficiently Active  ? Days of Exercise per Week: 3 days  ? Minutes of Exercise per Session: 20 min  ?Stress: No Stress Concern Present  ? Feeling of Stress : Not at all  ?Social Connections: Moderately Isolated  ? Frequency of Communication with Friends and Family: Twice a week  ? Frequency of Social Gatherings with Friends and Family: Twice a week  ? Attends Religious Services: More than 4 times per year  ? Active Member of Clubs or Organizations: No  ? Attends Archivist Meetings: Never  ? Marital Status: Never married  ? ? ?Tobacco Counseling ?Counseling given: Not Answered ? ? ?Clinical Intake: ? ?Pre-visit preparation completed: Yes ? ?Pain : No/denies pain ? ?  ? ?Nutritional Risks: None ?Diabetes:  No ? ?How often do you need to have someone help you when you read instructions, pamphlets, or other written materials from your doctor or pharmacy?: 1 - Never ?What is the last grade level you completed in school?

## 2021-12-25 NOTE — Patient Instructions (Signed)
[name] Allen , ?Thank you for taking time to come for your Medicare Wellness Visit. I appreciate your ongoing commitment to your health goals. Please review the following plan we discussed and let me know if I can assist you in the future.  ? ?Screening recommendations/referrals: ?Colonoscopy: referral 12/25/2021 ?Recommended yearly ophthalmology/optometry visit for glaucoma screening and checkup ?Recommended yearly dental visit for hygiene and checkup ? ?Vaccinations: ?Influenza vaccine: completed  ?Pneumococcal vaccine: completed  ?Tdap vaccine: 02/26/2012  due 02/2022 ?Shingles vaccine: will consider    ? ?Advanced directives: none  ? ?Conditions/risks identified: none  ? ?Next appointment: none  ? ?Preventive Care 72 Years and Older, Male ?Preventive care refers to lifestyle choices and visits with your health care provider that can promote health and wellness. ?What does preventive care include? ?A yearly physical exam. This is also called an annual well check. ?Dental exams once or twice a year. ?Routine eye exams. Ask your health care provider how often you should have your eyes checked. ?Personal lifestyle choices, including: ?Daily care of your teeth and gums. ?Regular physical activity. ?Eating a healthy diet. ?Avoiding tobacco and drug use. ?Limiting alcohol use. ?Practicing safe sex. ?Taking low doses of aspirin every day. ?Taking vitamin and mineral supplements as recommended by your health care provider. ?What happens during an annual well check? ?The services and screenings done by your health care provider during your annual well check will depend on your age, overall health, lifestyle risk factors, and family history of disease. ?Counseling  ?Your health care provider may ask you questions about your: ?Alcohol use. ?Tobacco use. ?Drug use. ?Emotional well-being. ?Home and relationship well-being. ?Sexual activity. ?Eating habits. ?History of falls. ?Memory and ability to understand (cognition). ?Work  and work Statistician. ?Screening  ?You may have the following tests or measurements: ?Height, weight, and BMI. ?Blood pressure. ?Lipid and cholesterol levels. These may be checked every 5 years, or more frequently if you are over 42 years old. ?Skin check. ?Lung cancer screening. You may have this screening every year starting at age 72 if you have a 30-pack-year history of smoking and currently smoke or have quit within the past 15 years. ?Fecal occult blood test (FOBT) of the stool. You may have this test every year starting at age 72. ?Flexible sigmoidoscopy or colonoscopy. You may have a sigmoidoscopy every 5 years or a colonoscopy every 10 years starting at age 57. ?Prostate cancer screening. Recommendations will vary depending on your family history and other risks. ?Hepatitis C blood test. ?Hepatitis B blood test. ?Sexually transmitted disease (STD) testing. ?Diabetes screening. This is done by checking your blood sugar (glucose) after you have not eaten for a while (fasting). You may have this done every 1-3 years. ?Abdominal aortic aneurysm (AAA) screening. You may need this if you are a current or former smoker. ?Osteoporosis. You may be screened starting at age 72 if you are at high risk. ?Talk with your health care provider about your test results, treatment options, and if necessary, the need for more tests. ?Vaccines  ?Your health care provider may recommend certain vaccines, such as: ?Influenza vaccine. This is recommended every year. ?Tetanus, diphtheria, and acellular pertussis (Tdap, Td) vaccine. You may need a Td booster every 10 years. ?Zoster vaccine. You may need this after age 72. ?Pneumococcal 13-valent conjugate (PCV13) vaccine. One dose is recommended after age 72. ?Pneumococcal polysaccharide (PPSV23) vaccine. One dose is recommended after age 72. ?Talk to your health care provider about which screenings and vaccines you need  and how often you need them. ?This information is not intended  to replace advice given to you by your health care provider. Make sure you discuss any questions you have with your health care provider. ?Document Released: 10/07/2015 Document Revised: 05/30/2016 Document Reviewed: 07/12/2015 ?Elsevier Interactive Patient Education ? 2017 [place]. ? ?Fall Prevention in the Home ?Falls can cause injuries. They can happen to people of all ages. There are many things you can do to make your home safe and to help prevent falls. ?What can I do on the outside of my home? ?Regularly fix the edges of walkways and driveways and fix any cracks. ?Remove anything that might make you trip as you walk through a door, such as a raised step or threshold. ?Trim any bushes or trees on the path to your home. ?Use bright outdoor lighting. ?Clear any walking paths of anything that might make someone trip, such as rocks or tools. ?Regularly check to see if handrails are loose or broken. Make sure that both sides of any steps have handrails. ?Any raised decks and porches should have guardrails on the edges. ?Have any leaves, snow, or ice cleared regularly. ?Use sand or salt on walking paths during winter. ?Clean up any spills in your garage right away. This includes oil or grease spills. ?What can I do in the bathroom? ?Use night lights. ?Install grab bars by the toilet and in the tub and shower. Do not use towel bars as grab bars. ?Use non-skid mats or decals in the tub or shower. ?If you need to sit down in the shower, use a plastic, non-slip stool. ?Keep the floor dry. Clean up any water that spills on the floor as soon as it happens. ?Remove soap buildup in the tub or shower regularly. ?Attach bath mats securely with double-sided non-slip rug tape. ?Do not have throw rugs and other things on the floor that can make you trip. ?What can I do in the bedroom? ?Use night lights. ?Make sure that you have a light by your bed that is easy to reach. ?Do not use any sheets or blankets that are too big  for your bed. They should not hang down onto the floor. ?Have a firm chair that has side arms. You can use this for support while you get dressed. ?Do not have throw rugs and other things on the floor that can make you trip. ?What can I do in the kitchen? ?Clean up any spills right away. ?Avoid walking on wet floors. ?Keep items that you use a lot in easy-to-reach places. ?If you need to reach something above you, use a strong step stool that has a grab bar. ?Keep electrical cords out of the way. ?Do not use floor polish or wax that makes floors slippery. If you must use wax, use non-skid floor wax. ?Do not have throw rugs and other things on the floor that can make you trip. ?What can I do with my stairs? ?Do not leave any items on the stairs. ?Make sure that there are handrails on both sides of the stairs and use them. Fix handrails that are broken or loose. Make sure that handrails are as long as the stairways. ?Check any carpeting to make sure that it is firmly attached to the stairs. Fix any carpet that is loose or worn. ?Avoid having throw rugs at the top or bottom of the stairs. If you do have throw rugs, attach them to the floor with carpet tape. ?Make sure that you  have a light switch at the top of the stairs and the bottom of the stairs. If you do not have them, ask someone to add them for you. ?What else can I do to help prevent falls? ?Wear shoes that: ?Do not have high heels. ?Have rubber bottoms. ?Are comfortable and fit you well. ?Are closed at the toe. Do not wear sandals. ?If you use a stepladder: ?Make sure that it is fully opened. Do not climb a closed stepladder. ?Make sure that both sides of the stepladder are locked into place. ?Ask someone to hold it for you, if possible. ?Clearly mark and make sure that you can see: ?Any grab bars or handrails. ?First and last steps. ?Where the edge of each step is. ?Use tools that help you move around (mobility aids) if they are needed. These  include: ?Canes. ?Walkers. ?Scooters. ?Crutches. ?Turn on the lights when you go into a dark area. Replace any light bulbs as soon as they burn out. ?Set up your furniture so you have a clear path. Avoid moving your furn

## 2022-03-14 ENCOUNTER — Other Ambulatory Visit: Payer: Self-pay | Admitting: Internal Medicine

## 2022-04-02 ENCOUNTER — Ambulatory Visit (HOSPITAL_COMMUNITY)
Admission: RE | Admit: 2022-04-02 | Payer: 59 | Source: Ambulatory Visit | Attending: Vascular Surgery | Admitting: Vascular Surgery

## 2022-04-10 ENCOUNTER — Other Ambulatory Visit: Payer: Self-pay | Admitting: Internal Medicine

## 2022-05-12 ENCOUNTER — Other Ambulatory Visit: Payer: Self-pay | Admitting: Internal Medicine

## 2022-06-06 ENCOUNTER — Other Ambulatory Visit: Payer: Self-pay | Admitting: Internal Medicine

## 2022-06-10 ENCOUNTER — Other Ambulatory Visit: Payer: Self-pay | Admitting: Internal Medicine

## 2022-07-22 ENCOUNTER — Other Ambulatory Visit: Payer: Self-pay | Admitting: Internal Medicine

## 2022-08-12 IMAGING — CR DG ORBITS FOR FOREIGN BODY
2 series · 2 of 2 positions shown · non-contrast
Comparison: none

CLINICAL DATA: History of metal exposure

EXAM:
ORBITS FOR FOREIGN BODY - 2 VIEW

[w orbit pa (1 of 2)]
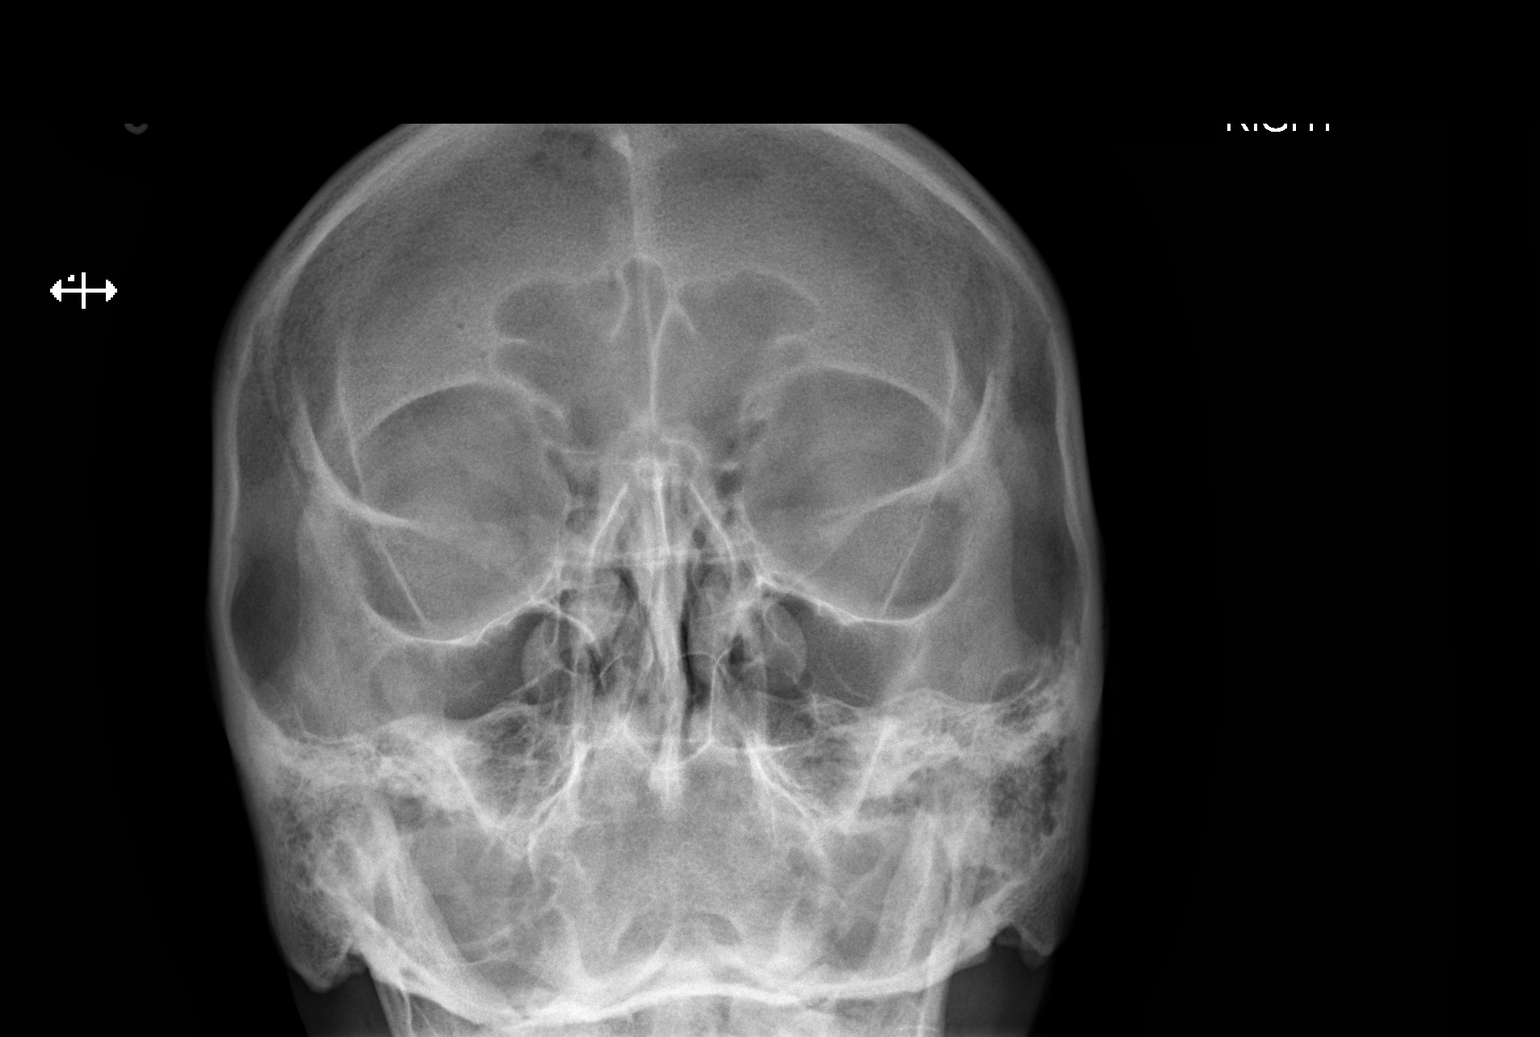

[w orbit pa (2 of 2)]
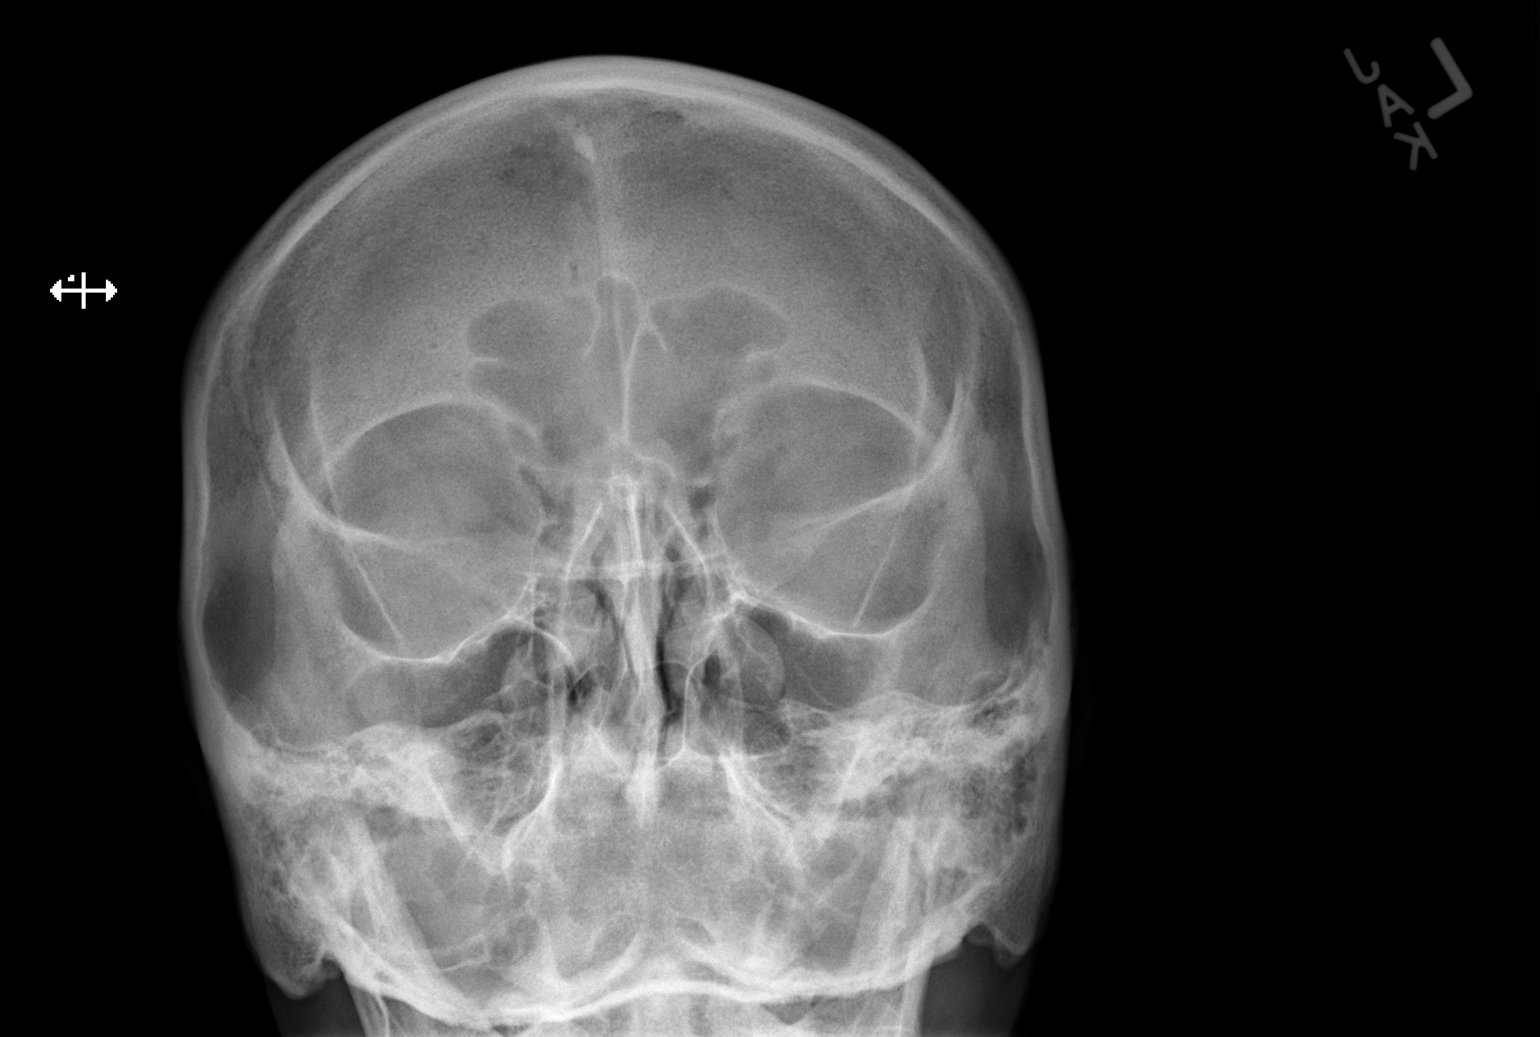

[2 of 2 positions shown; findings below may reference images not displayed]

FINDINGS: There is no evidence of metallic foreign body within the orbits. No
significant bone abnormality identified. Missing dentition.
IMPRESSION: No evidence of metallic foreign body within the orbits.

## 2022-09-26 ENCOUNTER — Other Ambulatory Visit: Payer: Self-pay | Admitting: Internal Medicine

## 2022-11-15 ENCOUNTER — Ambulatory Visit (INDEPENDENT_AMBULATORY_CARE_PROVIDER_SITE_OTHER): Payer: 59 | Admitting: Internal Medicine

## 2022-11-15 VITALS — BP 130/78 | HR 71 | Temp 98.1°F | Ht 67.0 in | Wt 158.0 lb

## 2022-11-15 DIAGNOSIS — E559 Vitamin D deficiency, unspecified: Secondary | ICD-10-CM

## 2022-11-15 DIAGNOSIS — F17200 Nicotine dependence, unspecified, uncomplicated: Secondary | ICD-10-CM

## 2022-11-15 DIAGNOSIS — Z0001 Encounter for general adult medical examination with abnormal findings: Secondary | ICD-10-CM | POA: Diagnosis not present

## 2022-11-15 DIAGNOSIS — R739 Hyperglycemia, unspecified: Secondary | ICD-10-CM

## 2022-11-15 DIAGNOSIS — R972 Elevated prostate specific antigen [PSA]: Secondary | ICD-10-CM

## 2022-11-15 DIAGNOSIS — E538 Deficiency of other specified B group vitamins: Secondary | ICD-10-CM | POA: Diagnosis not present

## 2022-11-15 DIAGNOSIS — Z1211 Encounter for screening for malignant neoplasm of colon: Secondary | ICD-10-CM

## 2022-11-15 DIAGNOSIS — I10 Essential (primary) hypertension: Secondary | ICD-10-CM

## 2022-11-15 DIAGNOSIS — E7800 Pure hypercholesterolemia, unspecified: Secondary | ICD-10-CM

## 2022-11-15 MED ORDER — TRAZODONE HCL 50 MG PO TABS
ORAL_TABLET | ORAL | 1 refills | Status: DC
Start: 2022-11-15 — End: 2023-05-14

## 2022-11-15 NOTE — Progress Notes (Signed)
Patient ID: [name] Allen, male   DOB: [birth year], 72 y.o.   MRN: [id number]         Chief Complaint:: wellness exam and hld, htn, smoking, low Vit D, elev psa       HPI:  [name] Allen is a 72 y.o. male here for wellness exam; declines tdap, covid booster, cologuard, but for shingrix at the pharmacy                        Also Pt denies chest pain, increased sob or doe, wheezing, orthopnea, PND, increased LE swelling, palpitations, dizziness or syncope.   Pt denies polydipsia, polyuria, or new focal neuro s/s.    Pt denies fever, wt loss, night sweats, loss of appetite, or other constitutional symptoms  Denies urinary symptoms such as dysuria, frequency, urgency, flank pain, hematuria or n/v, fever, chills.  Still smoking, not ready to quit.    Wt Readings from Last 3 Encounters:  11/15/22 158 lb (71.7 kg)  05/11/21 163 lb (73.9 kg)  05/05/21 163 lb 3.2 oz (74 kg)   BP Readings from Last 3 Encounters:  11/15/22 130/78  05/05/21 (!) 148/78  04/25/21 115/75   Immunization History  Administered Date(s) Administered   Fluad Quad(high Dose 65+) 06/21/2020   Influenza, High Dose Seasonal PF 09/07/2015   Influenza-Unspecified 07/31/2017, 07/08/2018, 06/17/2019, 08/13/2022   PFIZER(Purple Top)SARS-COV-2 Vaccination 10/26/2019, 11/16/2019, 06/27/2020   Pneumococcal Conjugate-13 08/07/2017   Pneumococcal Polysaccharide-23 11/19/2014   Tdap 02/29/2012   Zoster Recombinat (Shingrix) 01/10/2022   Health Maintenance Due  Topic Date Due   DTaP/Tdap/Td (2 - Td or Tdap) 02/28/2022   Medicare Annual Wellness (AWV)  12/26/2022      Past Medical History:  Diagnosis Date   Anxiety    Coronary artery disease previouly seen by cardiologist--- dr Marlou Porch, [place] [facility] in epic 11-08-2016 was released prn and follow up with pcp   cardiac cath 05-18-2004 severe 1V CAD of LAD & nonobstructive cad RCA/ Cfx;  05-19-2004 s/p CABG x1 ,  LIMA to distalLAD //    recurrent chest pain  LHC 03/26/12-- nonobOstial LAD,patent  graft, minor irregularities Cfx, PCI/ DES x1 to midRCA 99% EF 55-65%//  nuclear study 11-12-2012  normal w/ no ischemia and normal LVF and wall motion, nuclear ef 58%   Depression    GERD (gastroesophageal reflux disease)    History of 2019 novel coronavirus disease (COVID-19) 11/10/2020   pt tested positive for covid, test done for surgery;  per pt was asymptomatic   HLD (hyperlipidemia)    Hypertension    followed by pcp---  currently treated w/ diet, no medication since 2020   Prostate cancer [facility]) urologist--- dr gay/  oncologist--- dr Tammi Klippel   dx 10/ 2021,  Stage T1c, Gleason 3+4, PSA 7.02   Vitamin D deficiency    Past Surgical History:  Procedure Laterality Date   CARDIAC CATHETERIZATION  05-18-2004  '@[facility]'$    1V CAD involving distal LAD ,  nonobstructive cad involving RCA/ CFx,  ef 45-50%,  anterior and inferior apical hypokinesis   COLONOSCOPY WITH PROPOFOL  last one 11-22-2017   CORONARY ARTERY BYPASS GRAFT  05-19-2004  dr Roxy Manns '@[facility]'$    x1  LIMA to distal LAD   INGUINAL HERNIA REPAIR Right 03-29-2008  '@[facility]'$    LEFT HEART CATHETERIZATION WITH CORONARY/GRAFT ANGIOGRAM  03/26/2012   Procedure: LEFT HEART CATHETERIZATION WITH Beatrix Fetters;  Surgeon: Peter M Martinique, MD;  Location: [facility] CATH LAB;  Service: Cardiovascular;;  PENILE PROSTHESIS IMPLANT N/A 09/11/2017   Procedure: PENILE PROTHESIS INFLATABLE AMS;  Surgeon: Lucas Mallow, MD;  Location[facility];  Service: Urology;  Laterality: N/A;   PERCUTANEOUS CORONARY STENT INTERVENTION (PCI-S)  03/26/2012   Procedure: PERCUTANEOUS CORONARY STENT INTERVENTION (PCI-S);  Surgeon: Peter M Martinique, MD;  Location: [facility] CATH LAB;  Service: Cardiovascular;;   RADIOACTIVE SEED IMPLANT N/A 12/01/2020   Procedure: RADIOACTIVE SEED IMPLANT/BRACHYTHERAPY IMPLANT/ WITH CYSTOSCOPY;  Surgeon: Janith Lima, MD;  Location: [facility];  Service: Urology;  Laterality: N/A;   SPACE OAR INSTILLATION N/A 12/01/2020    Procedure: SPACE OAR INSTILLATION;  Surgeon: Janith Lima, MD;  Location: [facility];  Service: Urology;  Laterality: N/A;    reports that he quit smoking about 21 years ago. His smoking use included cigarettes. He has a 3.00 pack-year smoking history. He has never used smokeless tobacco. He reports that he does not currently use alcohol. He reports that he does not currently use drugs after having used the following drugs: Marijuana. family history includes Cancer in his father; Depression in his mother; Diabetes in his mother; Early death in his father and mother; Heart disease in his mother; Hyperlipidemia in his mother; Hypertension in his mother and sister; Kidney disease in his father; Prostate cancer in his father. No Known Allergies Current Outpatient Medications on File Prior to Visit  Medication Sig Dispense Refill   aspirin 81 MG tablet Take 81 mg by mouth daily.     Multiple Vitamin (MULTIVITAMIN) tablet Take 1 tablet by mouth daily.      tamsulosin (FLOMAX) 0.4 MG CAPS capsule Take 0.4 mg by mouth daily after supper.     atorvastatin (LIPITOR) 40 MG tablet Take 1 tablet (40 mg total) by mouth daily. (Patient not taking: Reported on 11/15/2022) 90 tablet 3   docusate sodium (COLACE) 100 MG capsule Take 1 capsule (100 mg total) by mouth daily as needed for up to 30 doses. (Patient not taking: Reported on 11/15/2022) 30 capsule 0   oxyCODONE-acetaminophen (PERCOCET) 5-325 MG tablet Take 1 tablet by mouth every 4 (four) hours as needed for up to 18 doses for severe pain. (Patient not taking: Reported on 11/15/2022) 18 tablet 0   rosuvastatin (CRESTOR) 40 MG tablet Take 1 tablet (40 mg total) by mouth every other day. (Patient not taking: Reported on 11/15/2022) 90 tablet 3   tiZANidine (ZANAFLEX) 2 MG tablet Take 2 mg by mouth every 6 (six) hours as needed. (Patient not taking: Reported on 11/15/2022)     [DISCONTINUED] pantoprazole (PROTONIX) 40 MG tablet TAKE 1 TABLET BY MOUTH  EVERY DAY (Patient not taking: Reported on 08/29/2019) 90 tablet 1   No current facility-administered medications on file prior to visit.        ROS:  All others reviewed and negative.  Objective        PE:  BP 130/78 (BP Location: Right Arm, Patient Position: Sitting, Cuff Size: Large)   Pulse 71   Temp 98.1 F (36.7 C) (Oral)   Ht '5\' 7"'$  (1.702 m)   Wt 158 lb (71.7 kg)   SpO2 95%   BMI 24.75 kg/m                 Constitutional: Pt appears in NAD               HENT: Head: NCAT.  Right Ear: External ear normal.                 Left Ear: External ear normal.                Eyes: . Pupils are equal, round, and reactive to light. Conjunctivae and EOM are normal               Nose: without d/c or deformity               Neck: Neck supple. Gross normal ROM               Cardiovascular: Normal rate and regular rhythm.                 Pulmonary/Chest: Effort normal and breath sounds without rales or wheezing.                Abd:  Soft, NT, ND, + BS, no organomegaly               Neurological: Pt is alert. At baseline orientation, motor grossly intact               Skin: Skin is warm. No rashes, no other new lesions, LE edema - none               Psychiatric: Pt behavior is normal without agitation   Micro: none  Cardiac tracings I have personally interpreted today:  none  Pertinent Radiological findings (summarize): none   Lab Results  Component Value Date   WBC 10.4 11/23/2020   HGB 14.1 11/23/2020   HCT 43.5 11/23/2020   PLT 347 11/23/2020   GLUCOSE 105 (H) 11/23/2020   CHOL 168 05/05/2021   TRIG 94 05/05/2021   HDL 38 (L) 05/05/2021   LDLCALC 112 (H) 05/05/2021   ALT 13 05/05/2021   AST 17 05/05/2021   NA 136 11/23/2020   K 4.3 11/23/2020   CL 106 11/23/2020   CREATININE 1.06 11/23/2020   BUN 8 11/23/2020   CO2 24 11/23/2020   TSH 1.08 05/04/2020   PSA 12.2 (H) 05/04/2020   INR 1.0 11/23/2020   HGBA1C 5.9 (H) 05/04/2020   Assessment/Plan:  [name] Allen is a 72 y.o. Black or African American [2] male with  has a past medical history of Anxiety, Coronary artery disease (previouly seen by cardiologist--- dr Marlou Porch, lov in epic 11-08-2016 was released prn and follow up with pcp), Depression, GERD (gastroesophageal reflux disease), History of 2019 novel coronavirus disease (COVID-19) (11/10/2020), HLD (hyperlipidemia), Hypertension, Prostate cancer [facility]) (urologist--- dr gay/  oncologist--- dr Tammi Klippel), and Vitamin D deficiency.  Vitamin D deficiency Last vitamin D Lab Results  Component Value Date   VD25OH 9 (L) 05/04/2020   Low, to start oral replacement   Encounter for well adult exam with abnormal findings Age and sex appropriate education and counseling updated with regular exercise and diet Referrals for preventative services - for cologuard Immunizations addressed - declines tdap, covid booster, but for shingrx at pharmacy Smoking counseling  - Pt counsled to quit, pt not ready Evidence for depression or other mood disorder - none significant Most recent labs reviewed. I have personally reviewed and have noted: 1) the patient's medical and social history 2) The patient's current medications and supplements 3) The patient's height, weight, and BMI have been recorded in the chart   HYPERCHOLESTEROLEMIA Lab Results  Component Value Date   LDLCALC 112 (H) 05/05/2021   Uncontrolled,  pt to continue current statin lipitor 40 mg qd, and f/u lab today   Hypertension BP Readings from Last 3 Encounters:  11/15/22 130/78  05/05/21 (!) 148/78  04/25/21 115/75   Stable, pt to continue medical treatment  - diet, wt control  Smoking Pt counsled to quit, pt not ready  Elevated PSA Also for f/u psa  Followup: Return in about 1 year (around 11/16/2023).  Cathlean Cower, MD 11/21/2022 6:56 PM [facility]

## 2022-11-15 NOTE — Patient Instructions (Addendum)
Please remember to see the pharmacy about the Shingles shot #2  You will be contacted regarding the referral for: cologuard  Please continue all other medications as before, and refills have been done if requested - the trazodone  Please have the pharmacy call with any other refills you may need.  Please continue your efforts at being more active, low cholesterol diet, and weight control.  You are otherwise up to date with prevention measures today.  Please keep your appointments with your specialists as you may have planned  Please go to the LAB at the blood drawing area for the tests to be done  You will be contacted by phone if any changes need to be made immediately.  Otherwise, you will receive a letter about your results with an explanation, but please check with MyChart first.  Please remember to sign up for MyChart if you have not done so, as this will be important to you in the future with finding out test results, communicating by private email, and scheduling acute appointments online when needed.  Please make an Appointment to return for your 1 year visit, or sooner if needed

## 2022-11-15 NOTE — Assessment & Plan Note (Signed)
Last vitamin D Lab Results  Component Value Date   VD25OH 19 (L) 05/04/2020   Low, to start oral replacement

## 2022-11-21 ENCOUNTER — Encounter: Payer: Self-pay | Admitting: Internal Medicine

## 2022-11-21 NOTE — Assessment & Plan Note (Signed)
Also for f/u psa

## 2022-11-21 NOTE — Assessment & Plan Note (Signed)
BP Readings from Last 3 Encounters:  11/15/22 130/78  05/05/21 (!) 148/78  04/25/21 115/75   Stable, pt to continue medical treatment  - diet, wt control

## 2022-11-21 NOTE — Assessment & Plan Note (Signed)
Pt counsled to quit, pt not ready °

## 2022-11-21 NOTE — Assessment & Plan Note (Signed)
Lab Results  Component Value Date   LDLCALC 112 (H) 05/05/2021   Uncontrolled,  pt to continue current statin lipitor 40 mg qd, and f/u lab today

## 2022-11-21 NOTE — Assessment & Plan Note (Addendum)
Age and sex appropriate education and counseling updated with regular exercise and diet Referrals for preventative services - for cologuard Immunizations addressed - declines tdap, covid booster, but for shingrx at pharmacy Smoking counseling  - Pt counsled to quit, pt not ready Evidence for depression or other mood disorder - none significant Most recent labs reviewed. I have personally reviewed and have noted: 1) the patient's medical and social history 2) The patient's current medications and supplements 3) The patient's height, weight, and BMI have been recorded in the chart

## 2022-12-09 DIAGNOSIS — Z1211 Encounter for screening for malignant neoplasm of colon: Secondary | ICD-10-CM | POA: Diagnosis not present

## 2022-12-15 LAB — COLOGUARD: COLOGUARD: NEGATIVE

## 2022-12-27 ENCOUNTER — Telehealth: Payer: Self-pay

## 2022-12-27 NOTE — Telephone Encounter (Signed)
Called patient lvm to return call, to complete AWV at [phone].  If no return call within 15 minutes, patient may reschedule for the next available appointment with [facility] or [facility].  Coty Larsh N. Nilda Keathley, LPN. [facility] AWV Team Direct Dial: [phone]  

## 2023-04-26 NOTE — Progress Notes (Signed)
This encounter was created in error - please disregard. Called patient twice with no answer.  I left a detailed message for him to call office back to reschedule appointment.

## 2023-05-14 ENCOUNTER — Other Ambulatory Visit: Payer: Self-pay | Admitting: Internal Medicine

## 2023-05-14 ENCOUNTER — Other Ambulatory Visit: Payer: Self-pay

## 2023-07-26 DEATH — deceased
